# Patient Record
Sex: Female | Born: 1945 | Hispanic: No | Marital: Married | Smoking: Never smoker
Health system: Southern US, Community
[De-identification: ages and names within clinical notes are randomized; demographics above are authoritative.]

## PROBLEM LIST (undated history)

## (undated) DIAGNOSIS — I10 Essential (primary) hypertension: Secondary | ICD-10-CM

## (undated) DIAGNOSIS — W19XXXA Unspecified fall, initial encounter: Secondary | ICD-10-CM

## (undated) DIAGNOSIS — R7309 Other abnormal glucose: Secondary | ICD-10-CM

## (undated) DIAGNOSIS — R531 Weakness: Secondary | ICD-10-CM

## (undated) DIAGNOSIS — T148XXA Other injury of unspecified body region, initial encounter: Secondary | ICD-10-CM

## (undated) DIAGNOSIS — R112 Nausea with vomiting, unspecified: Secondary | ICD-10-CM

## (undated) DIAGNOSIS — R42 Dizziness and giddiness: Secondary | ICD-10-CM

## (undated) DIAGNOSIS — G939 Disorder of brain, unspecified: Secondary | ICD-10-CM

## (undated) DIAGNOSIS — Z9889 Other specified postprocedural states: Secondary | ICD-10-CM

## (undated) DIAGNOSIS — E119 Type 2 diabetes mellitus without complications: Secondary | ICD-10-CM

## (undated) DIAGNOSIS — I483 Typical atrial flutter: Secondary | ICD-10-CM

## (undated) DIAGNOSIS — I4891 Unspecified atrial fibrillation: Secondary | ICD-10-CM

## (undated) DIAGNOSIS — E785 Hyperlipidemia, unspecified: Secondary | ICD-10-CM

## (undated) DIAGNOSIS — I480 Paroxysmal atrial fibrillation: Secondary | ICD-10-CM

## (undated) HISTORY — PX: PARTIAL HYSTERECTOMY: SHX80

## (undated) HISTORY — DX: Dizziness and giddiness: R42

## (undated) HISTORY — DX: Paroxysmal atrial fibrillation: I48.0

## (undated) HISTORY — DX: Unspecified fall, initial encounter: W19.XXXA

## (undated) HISTORY — DX: Hyperlipidemia, unspecified: E78.5

## (undated) HISTORY — DX: Essential (primary) hypertension: I10

## (undated) HISTORY — DX: Weakness: R53.1

## (undated) HISTORY — PX: ELBOW FRACTURE SURGERY: SHX616

## (undated) HISTORY — DX: Unspecified atrial fibrillation: I48.91

## (undated) HISTORY — DX: Other injury of unspecified body region, initial encounter: T14.8XXA

## (undated) HISTORY — DX: Typical atrial flutter: I48.3

## (undated) HISTORY — DX: Disorder of brain, unspecified: G93.9

## (undated) HISTORY — PX: APPENDECTOMY: SHX54

## (undated) HISTORY — DX: Other abnormal glucose: R73.09

## (undated) HISTORY — PX: KNEE ARTHROSCOPY: SUR90

---

## 1998-04-05 ENCOUNTER — Other Ambulatory Visit: Admission: RE | Admit: 1998-04-05 | Discharge: 1998-04-05 | Payer: Self-pay | Admitting: *Deleted

## 1999-04-06 ENCOUNTER — Other Ambulatory Visit: Admission: RE | Admit: 1999-04-06 | Discharge: 1999-04-06 | Payer: Self-pay | Admitting: Obstetrics and Gynecology

## 2000-08-17 ENCOUNTER — Other Ambulatory Visit: Admission: RE | Admit: 2000-08-17 | Discharge: 2000-08-17 | Payer: Self-pay | Admitting: *Deleted

## 2010-12-20 ENCOUNTER — Emergency Department (HOSPITAL_BASED_OUTPATIENT_CLINIC_OR_DEPARTMENT_OTHER)
Admission: EM | Admit: 2010-12-20 | Discharge: 2010-12-20 | Disposition: A | Discharge status: Home / Self Care | Payer: BC Managed Care – PPO | Attending: Emergency Medicine | Admitting: Emergency Medicine

## 2010-12-20 ENCOUNTER — Emergency Department (INDEPENDENT_AMBULATORY_CARE_PROVIDER_SITE_OTHER): Payer: BC Managed Care – PPO

## 2010-12-20 DIAGNOSIS — R000 Tachycardia, unspecified: Secondary | ICD-10-CM

## 2010-12-20 DIAGNOSIS — R29898 Other symptoms and signs involving the musculoskeletal system: Secondary | ICD-10-CM | POA: Insufficient documentation

## 2010-12-20 DIAGNOSIS — R002 Palpitations: Secondary | ICD-10-CM | POA: Insufficient documentation

## 2010-12-20 DIAGNOSIS — I4891 Unspecified atrial fibrillation: Secondary | ICD-10-CM | POA: Insufficient documentation

## 2010-12-20 LAB — BASIC METABOLIC PANEL
BUN: 16 mg/dL (ref 6–23)
CO2: 27 mEq/L (ref 19–32)
Chloride: 103 mEq/L (ref 96–112)
Glucose, Bld: 124 mg/dL — ABNORMAL HIGH (ref 70–99)
Potassium: 4 mEq/L (ref 3.5–5.1)
Sodium: 143 mEq/L (ref 135–145)

## 2010-12-20 LAB — POCT CARDIAC MARKERS
CKMB, poc: 1.4 ng/mL (ref 1.0–8.0)
CKMB, poc: <1 ng/mL — ABNORMAL LOW (ref 1.0–8.0)
Myoglobin, poc: 33.7 ng/mL (ref 12–200)
Troponin i, poc: <0.05 ng/mL (ref 0.00–0.09)
Troponin i, poc: <0.05 ng/mL (ref 0.00–0.09)

## 2010-12-20 LAB — CBC
HCT: 39.8 % (ref 36.0–46.0)
Hemoglobin: 14 g/dL (ref 12.0–15.0)
MCV: 90.2 fL (ref 78.0–100.0)
RDW: 13.3 % (ref 11.5–15.5)
WBC: 6.6 10*3/uL (ref 4.0–10.5)

## 2010-12-20 LAB — DIFFERENTIAL
Basophils Absolute: 0 10*3/uL (ref 0.0–0.1)
Eosinophils Relative: 2 % (ref 0–5)
Lymphocytes Relative: 55 % — ABNORMAL HIGH (ref 12–46)
Lymphs Abs: 3.6 10*3/uL (ref 0.7–4.0)
Neutro Abs: 2.4 10*3/uL (ref 1.7–7.7)

## 2013-10-15 DIAGNOSIS — M1991 Primary osteoarthritis, unspecified site: Secondary | ICD-10-CM | POA: Insufficient documentation

## 2013-10-15 DIAGNOSIS — R011 Cardiac murmur, unspecified: Secondary | ICD-10-CM | POA: Insufficient documentation

## 2013-10-17 DIAGNOSIS — R002 Palpitations: Secondary | ICD-10-CM | POA: Insufficient documentation

## 2015-02-17 DIAGNOSIS — I10 Essential (primary) hypertension: Secondary | ICD-10-CM | POA: Insufficient documentation

## 2015-05-20 DIAGNOSIS — Z8639 Personal history of other endocrine, nutritional and metabolic disease: Secondary | ICD-10-CM | POA: Insufficient documentation

## 2015-05-20 DIAGNOSIS — E782 Mixed hyperlipidemia: Secondary | ICD-10-CM | POA: Insufficient documentation

## 2015-12-04 ENCOUNTER — Emergency Department (HOSPITAL_BASED_OUTPATIENT_CLINIC_OR_DEPARTMENT_OTHER)
Admission: EM | Admit: 2015-12-04 | Discharge: 2015-12-04 | Disposition: A | Discharge status: Home / Self Care | Payer: Medicare Other | Attending: Emergency Medicine | Admitting: Emergency Medicine

## 2015-12-04 ENCOUNTER — Emergency Department (HOSPITAL_BASED_OUTPATIENT_CLINIC_OR_DEPARTMENT_OTHER): Payer: Medicare Other

## 2015-12-04 ENCOUNTER — Encounter (HOSPITAL_BASED_OUTPATIENT_CLINIC_OR_DEPARTMENT_OTHER): Payer: Self-pay

## 2015-12-04 DIAGNOSIS — E119 Type 2 diabetes mellitus without complications: Secondary | ICD-10-CM | POA: Diagnosis not present

## 2015-12-04 DIAGNOSIS — R197 Diarrhea, unspecified: Secondary | ICD-10-CM | POA: Diagnosis not present

## 2015-12-04 DIAGNOSIS — J111 Influenza due to unidentified influenza virus with other respiratory manifestations: Secondary | ICD-10-CM | POA: Insufficient documentation

## 2015-12-04 DIAGNOSIS — Y9289 Other specified places as the place of occurrence of the external cause: Secondary | ICD-10-CM | POA: Diagnosis not present

## 2015-12-04 DIAGNOSIS — I10 Essential (primary) hypertension: Secondary | ICD-10-CM | POA: Insufficient documentation

## 2015-12-04 DIAGNOSIS — S0081XA Abrasion of other part of head, initial encounter: Secondary | ICD-10-CM | POA: Insufficient documentation

## 2015-12-04 DIAGNOSIS — R55 Syncope and collapse: Secondary | ICD-10-CM | POA: Diagnosis present

## 2015-12-04 DIAGNOSIS — W01198A Fall on same level from slipping, tripping and stumbling with subsequent striking against other object, initial encounter: Secondary | ICD-10-CM | POA: Insufficient documentation

## 2015-12-04 DIAGNOSIS — S0240DA Maxillary fracture, left side, initial encounter for closed fracture: Secondary | ICD-10-CM

## 2015-12-04 DIAGNOSIS — Y9389 Activity, other specified: Secondary | ICD-10-CM | POA: Diagnosis not present

## 2015-12-04 DIAGNOSIS — Y998 Other external cause status: Secondary | ICD-10-CM | POA: Insufficient documentation

## 2015-12-04 DIAGNOSIS — S8002XA Contusion of left knee, initial encounter: Secondary | ICD-10-CM | POA: Diagnosis not present

## 2015-12-04 HISTORY — DX: Essential (primary) hypertension: I10

## 2015-12-04 HISTORY — DX: Type 2 diabetes mellitus without complications: E11.9

## 2015-12-04 LAB — INFLUENZA PANEL BY PCR (TYPE A & B)
H1N1 flu by pcr: NOT DETECTED
Influenza A By PCR: POSITIVE — AB
Influenza B By PCR: NEGATIVE

## 2015-12-04 LAB — BASIC METABOLIC PANEL
Anion gap: 10 (ref 5–15)
BUN: 16 mg/dL (ref 6–20)
CALCIUM: 8.9 mg/dL (ref 8.9–10.3)
CHLORIDE: 100 mmol/L — AB (ref 101–111)
CO2: 28 mmol/L (ref 22–32)
CREATININE: 0.88 mg/dL (ref 0.44–1.00)
Glucose, Bld: 145 mg/dL — ABNORMAL HIGH (ref 65–99)
Potassium: 3.4 mmol/L — ABNORMAL LOW (ref 3.5–5.1)
SODIUM: 138 mmol/L (ref 135–145)

## 2015-12-04 LAB — URINALYSIS, ROUTINE W REFLEX MICROSCOPIC
Bilirubin Urine: NEGATIVE
Glucose, UA: NEGATIVE mg/dL
Hgb urine dipstick: NEGATIVE
Ketones, ur: 15 mg/dL — AB
LEUKOCYTES UA: NEGATIVE
NITRITE: NEGATIVE
PROTEIN: NEGATIVE mg/dL
Specific Gravity, Urine: 1.02 (ref 1.005–1.030)
pH: 6 (ref 5.0–8.0)

## 2015-12-04 LAB — CBG MONITORING, ED: GLUCOSE-CAPILLARY: 133 mg/dL — AB (ref 65–99)

## 2015-12-04 LAB — CBC
HCT: 42.1 % (ref 36.0–46.0)
Hemoglobin: 14.4 g/dL (ref 12.0–15.0)
MCH: 32.2 pg (ref 26.0–34.0)
MCHC: 34.2 g/dL (ref 30.0–36.0)
MCV: 94.2 fL (ref 78.0–100.0)
PLATELETS: 182 10*3/uL (ref 150–400)
RBC: 4.47 MIL/uL (ref 3.87–5.11)
RDW: 14.1 % (ref 11.5–15.5)
WBC: 8 10*3/uL (ref 4.0–10.5)

## 2015-12-04 LAB — TROPONIN I

## 2015-12-04 LAB — I-STAT CG4 LACTIC ACID, ED: LACTIC ACID, VENOUS: 1.43 mmol/L (ref 0.5–2.0)

## 2015-12-04 MED ORDER — ONDANSETRON 4 MG PO TBDP: ORAL_TABLET | ORAL | Status: DC

## 2015-12-04 MED ORDER — OSELTAMIVIR PHOSPHATE 75 MG PO CAPS: 75.0000 mg | ORAL_CAPSULE | Freq: Two times a day (BID) | ORAL | Status: DC

## 2015-12-04 MED ORDER — SODIUM CHLORIDE 0.9 % IV BOLUS (SEPSIS)
1000.0000 mL | Freq: Once | INTRAVENOUS | Status: AC
  Administered 2015-12-04: 1000 mL via INTRAVENOUS

## 2015-12-04 NOTE — ED Notes (Signed)
Pt made aware of the need for a urine sample. Unable to void at this time.

## 2015-12-04 NOTE — ED Notes (Signed)
Pt ambulated to and from restroom with no difficulty

## 2015-12-04 NOTE — ED Notes (Signed)
Pt ambulated with assistance from family.

## 2015-12-04 NOTE — ED Notes (Signed)
Family at bedside. 

## 2015-12-04 NOTE — ED Notes (Signed)
Patient here after syncopal episode x 2 this am, abrasion to face, had flu shot Thursday and developed chills, cough and severe fatigue yesterday. Vomiting and diarrhea this am. Pale on arrival

## 2015-12-04 NOTE — Discharge Instructions (Signed)
For your maxillary fracture, you should keep antibiotic ointment over the abrasion. You need to eat a soft diet for the next 3 weeks.   Influenza, Adult Influenza ("the flu") is a viral infection of the respiratory tract. It occurs more often in winter months because people spend more time in close contact with one another. Influenza can make you feel very sick. Influenza easily spreads from person to person (contagious). CAUSES  Influenza is caused by a virus that infects the respiratory tract. You can catch the virus by breathing in droplets from an infected person's cough or sneeze. You can also catch the virus by touching something that was recently contaminated with the virus and then touching your mouth, nose, or eyes. RISKS AND COMPLICATIONS You may be at risk for a more severe case of influenza if you smoke cigarettes, have diabetes, have chronic heart disease (such as heart failure) or lung disease (such as asthma), or if you have a weakened immune system. Elderly people and pregnant women are also at risk for more serious infections. The most common problem of influenza is a lung infection (pneumonia). Sometimes, this problem can require emergency medical care and may be life threatening. SIGNS AND SYMPTOMS  Symptoms typically last 4 to 10 days and may include:  Fever.  Chills.  Headache, body aches, and muscle aches.  Sore throat.  Chest discomfort and cough.  Poor appetite.  Weakness or feeling tired.  Dizziness.  Nausea or vomiting. DIAGNOSIS  Diagnosis of influenza is often made based on your history and a physical exam. A nose or throat swab test can be done to confirm the diagnosis. TREATMENT  In mild cases, influenza goes away on its own. Treatment is directed at relieving symptoms. For more severe cases, your health care provider may prescribe antiviral medicines to shorten the sickness. Antibiotic medicines are not effective because the infection is caused by a  virus, not by bacteria. HOME CARE INSTRUCTIONS  Take medicines only as directed by your health care provider.  Use a cool mist humidifier to make breathing easier.  Get plenty of rest until your temperature returns to normal. This usually takes 3 to 4 days.  Drink enough fluid to keep your urine clear or pale yellow.  Cover yourmouth and nosewhen coughing or sneezing,and wash your handswellto prevent thevirusfrom spreading.  Stay homefromwork orschool untilthe fever is gonefor at least 52full day. PREVENTION  An annual influenza vaccination (flu shot) is the best way to avoid getting influenza. An annual flu shot is now routinely recommended for all adults in the [place] SEEK MEDICAL CARE IF:  You experiencechest pain, yourcough worsens,or you producemore mucus.  Youhave nausea,vomiting, ordiarrhea.  Your fever returns or gets worse. SEEK IMMEDIATE MEDICAL CARE IF:  You havetrouble breathing, you become short of breath,or your skin ornails becomebluish.  You have severe painor stiffnessin the neck.  You develop a sudden headache, or pain in the face or ear.  You have nausea or vomiting that you cannot control. MAKE SURE YOU:   Understand these instructions.  Will watch your condition.  Will get help right away if you are not doing well or get worse.   This information is not intended to replace advice given to you by your health care provider. Make sure you discuss any questions you have with your health care provider.   Document Released: 09/22/2000 Document Revised: 10/16/2014 Document Reviewed: 12/25/2011 Elsevier Interactive Patient Education 2016 [employer].  Syncope Syncope is a medical term for fainting  or passing out. This means you lose consciousness and drop to the ground. People are generally unconscious for less than 5 minutes. You may have some muscle twitches for up to 15 seconds before waking up and returning to normal. Syncope  occurs more often in older adults, but it can happen to anyone. While most causes of syncope are not dangerous, syncope can be a sign of a serious medical problem. It is important to seek medical care.  CAUSES  Syncope is caused by a sudden drop in blood flow to the brain. The specific cause is often not determined. Factors that can bring on syncope include:  Taking medicines that lower blood pressure.  Sudden changes in posture, such as standing up quickly.  Taking more medicine than prescribed.  Standing in one place for too long.  Seizure disorders.  Dehydration and excessive exposure to heat.  Low blood sugar (hypoglycemia).  Straining to have a bowel movement.  Heart disease, irregular heartbeat, or other circulatory problems.  Fear, emotional distress, seeing blood, or severe pain. SYMPTOMS  Right before fainting, you may:  Feel dizzy or light-headed.  Feel nauseous.  See all white or all black in your field of vision.  Have cold, clammy skin. DIAGNOSIS  Your health care provider will ask about your symptoms, perform a physical exam, and perform an electrocardiogram (ECG) to record the electrical activity of your heart. Your health care provider may also perform other heart or blood tests to determine the cause of your syncope which may include:  Transthoracic echocardiogram (TTE). During echocardiography, sound waves are used to evaluate how blood flows through your heart.  Transesophageal echocardiogram (TEE).  Cardiac monitoring. This allows your health care provider to monitor your heart rate and rhythm in real time.  Holter monitor. This is a portable device that records your heartbeat and can help diagnose heart arrhythmias. It allows your health care provider to track your heart activity for several days, if needed.  Stress tests by exercise or by giving medicine that makes the heart beat faster. TREATMENT  In most cases, no treatment is needed. Depending on  the cause of your syncope, your health care provider may recommend changing or stopping some of your medicines. HOME CARE INSTRUCTIONS  Have someone stay with you until you feel stable.  Do not drive, use machinery, or play sports until your health care provider says it is okay.  Keep all follow-up appointments as directed by your health care provider.  Lie down right away if you start feeling like you might faint. Breathe deeply and steadily. Wait until all the symptoms have passed.  Drink enough fluids to keep your urine clear or pale yellow.  If you are taking blood pressure or heart medicine, get up slowly and take several minutes to sit and then stand. This can reduce dizziness. SEEK IMMEDIATE MEDICAL CARE IF:   You have a severe headache.  You have unusual pain in the chest, abdomen, or back.  You are bleeding from your mouth or rectum, or you have black or tarry stool.  You have an irregular or very fast heartbeat.  You have pain with breathing.  You have repeated fainting or seizure-like jerking during an episode.  You faint when sitting or lying down.  You have confusion.  You have trouble walking.  You have severe weakness.  You have vision problems. If you fainted, call your local emergency services (911 in [place] Do not drive yourself to the hospital.  This information is not intended to replace advice given to you by your health care provider. Make sure you discuss any questions you have with your health care provider.   Document Released: 09/25/2005 Document Revised: 02/09/2015 Document Reviewed: 11/24/2011 Elsevier Interactive Patient Education [employer]

## 2015-12-04 NOTE — ED Provider Notes (Signed)
CSN: [id number]     Arrival date & time 12/04/15  1100 History   First MD Initiated Contact with Patient 12/04/15 1112     Chief Complaint  Patient presents with  . Loss of Consciousness     (Consider location/radiation/quality/duration/timing/severity/associated sxs/prior Treatment) HPI Comments: Patient presents after syncopal episode. She has a history of diabetes and hypertension. She recently was taken off her diabetes medicine and is now diet controlled. She states that she saw her PCP for a routine checkup 2 days ago. She received a influenza vaccine at that time. She states yesterday she started feeling achy all over and felt like she was getting sick. She was very fatigued without energy. She noted some nasal congestion and a cough starting. She felt feverish through the night but didn't check her temperature. She states that this morning when she got up to walk to the bathroom, she felt like she wasn't good and make it due to the fatigue and subsequent passed out, striking her left face and head on the floor. Her husband got her up and then she had a subsequent brief syncopal episode.  There is no seizure type activity. She had vomiting and diarrhea following this. She complains of pain to the left face in her left knee following a fall. She also has a headache. She's not on anticoagulants. She has an ongoing cough. She denies any chest pain or shortness of breath. She feels little bit lightheaded but more generalized fatigue.  Patient is a 70 y.o. female presenting with syncope.  Loss of Consciousness Associated symptoms: fever (Subjective), nausea and vomiting   Associated symptoms: no chest pain, no diaphoresis, no dizziness, no headaches, no shortness of breath and no weakness     Past Medical History  Diagnosis Date  . Diabetes mellitus without complication (HCC)   . Hypertension    History reviewed. No pertinent past surgical history. No family history on file. Social History   Substance Use Topics  . Smoking status: Never Smoker   . Smokeless tobacco: None  . Alcohol Use: None   OB History    No data available     Review of Systems  Constitutional: Positive for fever (Subjective), chills, activity change, appetite change and fatigue. Negative for diaphoresis.  HENT: Positive for congestion, postnasal drip and rhinorrhea. Negative for sneezing.   Eyes: Negative.   Respiratory: Positive for cough. Negative for chest tightness and shortness of breath.   Cardiovascular: Positive for syncope. Negative for chest pain and leg swelling.  Gastrointestinal: Positive for nausea, vomiting and diarrhea. Negative for abdominal pain and blood in stool.  Genitourinary: Negative for frequency, hematuria, flank pain and difficulty urinating.  Musculoskeletal: Negative for back pain and arthralgias.  Skin: Negative for rash.  Neurological: Positive for syncope and light-headedness. Negative for dizziness, speech difficulty, weakness, numbness and headaches.      Allergies  Codeine and Prednisone  Home Medications   Prior to Admission medications   Medication Sig Start Date End Date Taking? Authorizing Provider  ondansetron (ZOFRAN ODT) 4 MG disintegrating tablet 4mg  ODT q4 hours prn nausea/vomit 12/04/15   Rolan Bucco, MD  oseltamivir (TAMIFLU) 75 MG capsule Take 1 capsule (75 mg total) by mouth every 12 (twelve) hours. 12/04/15   Rolan Bucco, MD   BP 104/46 mmHg  Pulse 84  Temp(Src) 99.1 F (37.3 C) (Oral)  Resp 22  Ht 5\' 5"  (1.651 m)  Wt 160 lb (72.576 kg)  BMI 26.63 kg/m2  SpO2 98% Physical Exam  Constitutional: She is oriented to person, place, and time. She appears well-developed and well-nourished.  HENT:  Head: Normocephalic.  Mouth/Throat: Oropharynx is clear and moist.  Patient has abrasions to the left side of face with pain and swelling over the left maxilla. There is no malocclusion. There is no hemotympanum. There is no evident eye injury.   Eyes: Conjunctivae and EOM are normal. Pupils are equal, round, and reactive to light.  Neck: Normal range of motion. Neck supple.  No pain to the cervical thoracic or lumbosacral spine  Cardiovascular: Normal rate, regular rhythm and normal heart sounds.   Pulmonary/Chest: Effort normal and breath sounds normal. No respiratory distress. She has no wheezes. She has no rales. She exhibits no tenderness.  Abdominal: Soft. Bowel sounds are normal. There is no tenderness. There is no rebound and no guarding.  Musculoskeletal: Normal range of motion. She exhibits tenderness. She exhibits no edema.  Small area of ecchymosis and tenderness to the lateral aspect of the left knee. There is no pain to the ankle or hip. There is no other pain on palpation or range of motion extremities  Lymphadenopathy:    She has no cervical adenopathy.  Neurological: She is alert and oriented to person, place, and time.  Motor 5 out of 5 all extraneous, sensation grossly intact to light touch all extremities, no pronator drift, cranial nerves intact  Skin: Skin is warm and dry. No rash noted.  Psychiatric: She has a normal mood and affect.    ED Course  Procedures (including critical care time) Labs Review Labs Reviewed  BASIC METABOLIC PANEL - Abnormal; Notable for the following:    Potassium 3.4 (*)    Chloride 100 (*)    Glucose, Bld 145 (*)    All other components within normal limits  URINALYSIS, ROUTINE W REFLEX MICROSCOPIC (NOT AT [facility]) - Abnormal; Notable for the following:    Ketones, ur 15 (*)    All other components within normal limits  CBG MONITORING, ED - Abnormal; Notable for the following:    Glucose-Capillary 133 (*)    All other components within normal limits  CBC  TROPONIN I  INFLUENZA PANEL BY PCR (TYPE A & B, H1N1)  I-STAT CG4 LACTIC ACID, ED    Imaging Review Dg Chest 2 View  12/04/2015  CLINICAL DATA:  Cough.  Initial encounter. EXAM: CHEST  2 VIEW COMPARISON:  12/19/2010  FINDINGS: Lungs are clear bilaterally. Heart and mediastinum are within normal limits. Trachea is midline. No large pleural effusions. Mild degenerative endplate and disc changes in the thoracic and lumbar spine. IMPRESSION: No active cardiopulmonary disease. Electronically Signed   By: Richarda Overlie M.D.   On: 12/04/2015 12:30   Ct Head Wo Contrast  12/04/2015  CLINICAL DATA:  70 year old who fell when she awoke to go to the bathroom earlier this morning. Two lacerations on the left cheek. Acute onset of headache and dizziness. Loss of consciousness. Patient hypotensive upon arrival to the emergency department. Current history of hypertension diabetes. Initial encounter. EXAM: CT HEAD WITHOUT CONTRAST CT MAXILLOFACIAL WITHOUT CONTRAST CT CERVICAL SPINE WITHOUT CONTRAST TECHNIQUE: Multidetector CT imaging of the head, cervical spine, and maxillofacial structures were performed using the standard protocol without intravenous contrast. Multiplanar CT image reconstructions of the cervical spine and maxillofacial structures were also generated. A vitamin-E capsule was placed on the right temple in order to reliably differentiate right from left. COMPARISON:  None. FINDINGS: CT HEAD FINDINGS Ventricular system normal in size and appearance  for age. No mass lesion. No midline shift. No acute hemorrhage or hematoma. No extra-axial fluid collections. No evidence of acute infarction. No focal brain parenchymal abnormality. No skull fracture or other focal osseous abnormality involving the skull. Bilateral mastoid air cells and bilateral middle ear cavities well-aerated. Minimal to mild bilateral carotid siphon and vertebral artery atherosclerosis. CT MAXILLOFACIAL FINDINGS Gas in the subcutaneous tissues of the left cheek related to the lacerations. No evidence of soft tissue hematoma. Inwardly displaced fracture of the anterior wall of the left maxillary sinus associated with a fluid level in the sinus. No other fractures  identified involving the facial bones. Temporomandibular joints intact without significant degenerative change. Both orbits and both globes intact. Small mucous retention cyst or polyp in the right maxillary sinus. Opacification of a solitary left anterior ethmoid air cell. Right ethmoid air cells, both frontal sinuses and both sphenoid sinuses well aerated. Slight bony nasal septal deviation to the left. CT CERVICAL SPINE FINDINGS No fractures identified involving the cervical spine. Sagittal reconstructed images demonstrate anatomic posterior alignment. Severe disc space narrowing and associated eburnation in the endplates adjacent to the C5-6 and C6-7 discs. Mild disc space narrowing at C4-5. No evidence of spinal stenosis. Facet joints intact throughout with degenerative changes. Coronal reformatted images demonstrate an intact craniocervical junction, intact C1-C2 articulation, intact dens, and intact lateral masses throughout. Facet and uncinate hypertrophy account for multilevel foraminal stenoses including mild left C3-4, moderate left and severe right C4-5, severe bilateral C5-6, severe bilateral C6-7. IMPRESSION: 1. No acute or significant intracranial abnormality for age. 2. Inwardly displaced fracture of the anterior wall of the left maxillary sinus. No other facial bone fractures identified. 3. No cervical spine fractures identified. 4. 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 No evidence of spinal stenosis. Electronically Signed   By: Hulan Saas M.D.   On: 12/04/2015 12:23   Ct Cervical Spine Wo Contrast  12/04/2015  CLINICAL DATA:  70 year old who fell when she awoke to go to the bathroom earlier this morning. Two lacerations on the left cheek. Acute onset of headache and dizziness. Loss of consciousness. Patient hypotensive upon arrival to the emergency department. Current history of hypertension  diabetes. Initial encounter. EXAM: CT HEAD WITHOUT CONTRAST CT MAXILLOFACIAL WITHOUT CONTRAST CT CERVICAL SPINE WITHOUT CONTRAST TECHNIQUE: Multidetector CT imaging of the head, cervical spine, and maxillofacial structures were performed using the standard protocol without intravenous contrast. Multiplanar CT image reconstructions of the cervical spine and maxillofacial structures were also generated. A vitamin-E capsule was placed on the right temple in order to reliably differentiate right from left. COMPARISON:  None. FINDINGS: CT HEAD FINDINGS Ventricular system normal in size and appearance for age. No mass lesion. No midline shift. No acute hemorrhage or hematoma. No extra-axial fluid collections. No evidence of acute infarction. No focal brain parenchymal abnormality. No skull fracture or other focal osseous abnormality involving the skull. Bilateral mastoid air cells and bilateral middle ear cavities well-aerated. Minimal to mild bilateral carotid siphon and vertebral artery atherosclerosis. CT MAXILLOFACIAL FINDINGS Gas in the subcutaneous tissues of the left cheek related to the lacerations. No evidence of soft tissue hematoma. Inwardly displaced fracture of the anterior wall of the left maxillary sinus associated with a fluid level in the sinus. No other fractures identified involving the facial bones. Temporomandibular joints intact without significant degenerative change. Both orbits and both globes intact. Small mucous retention cyst or polyp in the right maxillary  sinus. Opacification of a solitary left anterior ethmoid air cell. Right ethmoid air cells, both frontal sinuses and both sphenoid sinuses well aerated. Slight bony nasal septal deviation to the left. CT CERVICAL SPINE FINDINGS No fractures identified involving the cervical spine. Sagittal reconstructed images demonstrate anatomic posterior alignment. Severe disc space narrowing and associated eburnation in the endplates adjacent to the C5-6  and C6-7 discs. Mild disc space narrowing at C4-5. No evidence of spinal stenosis. Facet joints intact throughout with degenerative changes. Coronal reformatted images demonstrate an intact craniocervical junction, intact C1-C2 articulation, intact dens, and intact lateral masses throughout. Facet and uncinate hypertrophy account for multilevel foraminal stenoses including mild left C3-4, moderate left and severe right C4-5, severe bilateral C5-6, severe bilateral C6-7. IMPRESSION: 1. No acute or significant intracranial abnormality for age. 2. Inwardly displaced fracture of the anterior wall of the left maxillary sinus. No other facial bone fractures identified. 3. No cervical spine fractures identified. 4. 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 No evidence of spinal stenosis. Electronically Signed   By: Hulan Saas M.D.   On: 12/04/2015 12:23   Dg Knee Complete 4 Views Left  12/04/2015  CLINICAL DATA:  Pain after fall EXAM: LEFT KNEE - COMPLETE 4+ VIEW COMPARISON:  None. FINDINGS: No fracture or dislocation. Degenerative changes most prominent in medial compartment. IMPRESSION: No acute abnormality. Electronically Signed   By: Gerome Sam III M.D   On: 12/04/2015 12:30   Ct Maxillofacial Wo Cm  12/04/2015  CLINICAL DATA:  71 year old who fell when she awoke to go to the bathroom earlier this morning. Two lacerations on the left cheek. Acute onset of headache and dizziness. Loss of consciousness. Patient hypotensive upon arrival to the emergency department. Current history of hypertension diabetes. Initial encounter. EXAM: CT HEAD WITHOUT CONTRAST CT MAXILLOFACIAL WITHOUT CONTRAST CT CERVICAL SPINE WITHOUT CONTRAST TECHNIQUE: Multidetector CT imaging of the head, cervical spine, and maxillofacial structures were performed using the standard protocol without intravenous contrast. Multiplanar CT image  reconstructions of the cervical spine and maxillofacial structures were also generated. A vitamin-E capsule was placed on the right temple in order to reliably differentiate right from left. COMPARISON:  None. FINDINGS: CT HEAD FINDINGS Ventricular system normal in size and appearance for age. No mass lesion. No midline shift. No acute hemorrhage or hematoma. No extra-axial fluid collections. No evidence of acute infarction. No focal brain parenchymal abnormality. No skull fracture or other focal osseous abnormality involving the skull. Bilateral mastoid air cells and bilateral middle ear cavities well-aerated. Minimal to mild bilateral carotid siphon and vertebral artery atherosclerosis. CT MAXILLOFACIAL FINDINGS Gas in the subcutaneous tissues of the left cheek related to the lacerations. No evidence of soft tissue hematoma. Inwardly displaced fracture of the anterior wall of the left maxillary sinus associated with a fluid level in the sinus. No other fractures identified involving the facial bones. Temporomandibular joints intact without significant degenerative change. Both orbits and both globes intact. Small mucous retention cyst or polyp in the right maxillary sinus. Opacification of a solitary left anterior ethmoid air cell. Right ethmoid air cells, both frontal sinuses and both sphenoid sinuses well aerated. Slight bony nasal septal deviation to the left. CT CERVICAL SPINE FINDINGS No fractures identified involving the cervical spine. Sagittal reconstructed images demonstrate anatomic posterior alignment. Severe disc space narrowing and associated eburnation in the endplates adjacent to the C5-6 and C6-7 discs. Mild disc space narrowing at C4-5. No evidence  of spinal stenosis. Facet joints intact throughout with degenerative changes. Coronal reformatted images demonstrate an intact craniocervical junction, intact C1-C2 articulation, intact dens, and intact lateral masses throughout. Facet and uncinate  hypertrophy account for multilevel foraminal stenoses including mild left C3-4, moderate left and severe right C4-5, severe bilateral C5-6, severe bilateral C6-7. IMPRESSION: 1. No acute or significant intracranial abnormality for age. 2. Inwardly displaced fracture of the anterior wall of the left maxillary sinus. No other facial bone fractures identified. 3. No cervical spine fractures identified. 4. 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 No evidence of spinal stenosis. Electronically Signed   By: Hulan Saas M.D.   On: 12/04/2015 12:23   I have personally reviewed and evaluated these images and lab results as part of my medical decision-making.   EKG Interpretation   Date/Time:  Saturday December 04 2015 11:19:26 EST Ventricular Rate:  97 PR Interval:  144 QRS Duration: 89 QT Interval:  321 QTC Calculation: 408 R Axis:   75 Text Interpretation:  Sinus rhythm Borderline repolarization abnormality  slight ST depression inferiorly, similar to prior EKG.  ST depression  anteriolry, changed from prior EKG Confirmed by Glynda Soliday  MD, Sheranda Seabrooks ([id number])  on 12/04/2015 11:58:59 AM      MDM   Final diagnoses:  Influenza  Vasovagal syncope  Maxillary fracture, left side, initial encounter for closed fracture    Patient presents after syncopal episode. She's had preceding symptoms of runny nose congestion coughing and myalgias. She's had subjective fevers. Her symptoms are consistent with probable influenza. This likely led to the syncopal episode. She was given normal saline bolus here in the ED. Her blood pressures been stable. Her lactate is normal without other suggestions of sepsis. There is no evidence of pneumonia on x-ray. She did have a traumatic injury to her face from the syncopal episode. Her head CT is negative for intracranial hemorrhage. There is no evident injuries to her cervical  spine. CT face shows a displaced maxillary fracture. She has an overlying abrasion. I cleaned it out and I don't see evidence that it communicates with the fracture. I discussed the fracture with Dr. Emeline Darling, the on-call ENT. He advises that the patient can maintain a soft diet for the next few weeks. It is nonoperative. He does not recommend antibiotics but advises her to keep some antibiotic ointment on the wound. I discussed this with the patient.  Patient is feeling much better after IV fluids. She ambulated without symptoms. She feels comfortable going home. I advised her to continue by mouth fluid intake. I advised her that she can use Mucinex for the congestion. I do feel that we should start her on Tamiflu. I gave her a prescription for Tamiflu and Zofran. I advised her to stop taking her blood pressure medicine for the next 2 days and follow-up with her doctor on Monday to get advice on whether to restart it. She also needs a recheck on Monday to see how she's doing. I discussed her fracture and that she needs to maintain a soft diet for the next 3 weeks. I advised her to keep antibiotic ointment on her wounds. She had some very slight ST depression on EKG but she doesn't have chest pain shortness of breath or other symptoms that sound cardiac in nature. Her troponin is negative. She was eyes return here if she has worsening symptoms.    Rolan Bucco, MD 12/04/15 [phone]

## 2015-12-04 NOTE — ED Notes (Signed)
Pt transported to CT and XR at this time   

## 2015-12-04 NOTE — ED Notes (Signed)
MD at bedside. 

## 2015-12-06 ENCOUNTER — Telehealth (HOSPITAL_BASED_OUTPATIENT_CLINIC_OR_DEPARTMENT_OTHER): Payer: Self-pay | Admitting: Emergency Medicine

## 2016-04-21 ENCOUNTER — Telehealth: Payer: Self-pay | Admitting: Cardiovascular Disease

## 2016-04-21 NOTE — Telephone Encounter (Signed)
Received records from [facility] for appointment on 04/25/16 with Dr Duke Salviaandolph.  [facility] Dr Leonides Sakeandolph's schedule on 04/25/16. lp

## 2016-04-24 NOTE — Progress Notes (Signed)
Cardiology Office Note   Date:  04/25/2016   ID:  Ann Hobbs, DOB [birth year], MRN [id number]  PCP:  Smothers, Andree Elk, NP  Cardiologist:   Skeet Latch, MD   Chief Complaint  Patient presents with  . New Patient (Initial Visit)     History of Present Illness: Ann Hobbs is a 70 y.o. female with diabetes, hypertension, and hyperlipidemia who presents for an evaluation of palpitations.  Ann Hobbs was seen by Augusto Gamble, NP on 12/02/15 at which time she reported palpitations and was referred to cardiology for further evaluation.  Her brother was recently diagnosed with atrial fibrillation in her mother has a pacemaker due to heart block.  TSH at the time was 1.34.  She Reports episodes of palpitations that have been ongoing for years. She was seen in the emergency department in 2012 with an episode of heart racing to the 150s. On review of the chart it appears that she received IV diltiazem which did not break the rhythm. She subsequently received adenosine and returned to sinus rhythm. Her initial presenting rhythm appears to be in atrial flutter, though it was read as sinus tachycardia at the time. Her subsequent EKG showed atrial fibrillation with a rate of 100 bpm.  She was instructed to follow up with cardiology, which she did, but reports that he never looked at her EKGs.  He recommended that she take a beta blocker but she declined due to concern that it might cause fatigue.  Since 2012 she continues to have episodes of palpitations. She notices that it occurs more frequently after taking NSAIDs.  She gets a strange feeling in her throat and can feel her heart beating in her ears.  It typically occurs at night and goes away by the time she wakes up in the AM.  She doesn't feel short of breath but notes pressure in her chest.  There is no lightheadeness or dizzines.  Her symptoms are worse with exertion. The episodes occur 1-2 times per month and her heart rate  goes to 140-150.  She notes that her blood pressure goes up when she is in this rhythm, but it is typically well-controlled.  In fact, she has not been taking Maxzide lately because her BP has been low.  She lost 30 lb over the last several months.  She had an episode of syncope in February that was attributed to the flu.  Her BP was very low when she was seen in the ED.    Ann Hobbs does water aerobics 2-3 times per week.  She denies exertional symptoms.  She has chronic ankle edema.   Past Medical History  Diagnosis Date  . Diabetes mellitus without complication ([place])   . Hypertension   . Atrial fibrillation ([place]) 04/25/2016  . Atrial flutter ([place]) 04/25/2016  . Essential hypertension 04/25/2016  . Hyperlipidemia 04/25/2016    No past surgical history on file.   Current Outpatient Prescriptions  Medication Sig Dispense Refill  . Blood Glucose Monitoring Suppl (GHT BLOOD GLUCOSE MONITOR) w/Device KIT Apply 1 strip topically as directed.    Marland Kitchen estradiol (ESTRACE) 0.1 MG/GM vaginal cream Place 1 Applicatorful vaginally as directed.    . Multiple Vitamins-Minerals (WOMENS MULTIVITAMIN PLUS PO) Take 1 tablet by mouth daily.    . ReliOn Ultra Thin Lancets MISC Apply 1 strip topically as directed.    Marland Kitchen apixaban (ELIQUIS) 5 MG TABS tablet Take 1 tablet (5 mg total) by mouth 2 (two) times daily. 60 tablet  5  . metoprolol tartrate (LOPRESSOR) 25 MG tablet 1/2 TABLET BY MOUTH DAILY AS NEEDED FOR ONSET OF ATRIAL FIB/FLUTTER 15 tablet 5   No current facility-administered medications for this visit.    Allergies:   Codeine and Prednisone    Social History:  The patient  reports that she has never smoked. She does not have any smokeless tobacco history on file.   Family History:  The patient's family history includes Cancer in her father.    ROS:  Please see the history of present illness.   Otherwise, review of systems are positive for none.   All other systems are reviewed and negative.     PHYSICAL EXAM: VS:  BP 128/78 mmHg  Pulse 69  Ht '5\' 4"'$  (1.626 m)  Wt 158 lb 3.2 oz (71.759 kg)  BMI 27.14 kg/m2  SpO2 97% , BMI Body mass index is 27.14 kg/(m^2). GENERAL:  Well appearing HEENT:  Pupils equal round and reactive, fundi not visualized, oral mucosa unremarkable NECK:  No jugular venous distention, waveform within normal limits, carotid upstroke brisk and symmetric, no bruits, no thyromegaly LYMPHATICS:  No cervical adenopathy LUNGS:  Clear to auscultation bilaterally HEART:  RRR.  PMI not displaced or sustained,S1 and S2 within normal limits, no S3, no S4, no clicks, no rubs, no murmurs ABD:  Flat, positive bowel sounds normal in frequency in pitch, no bruits, no rebound, no guarding, no midline pulsatile mass, no hepatomegaly, no splenomegaly EXT:  2 plus pulses throughout, no edema, no cyanosis no clubbing SKIN:  No rashes no nodules NEURO:  Cranial nerves II through XII grossly intact, motor grossly intact throughout PSYCH:  Cognitively intact, oriented to person place and tim   EKG:  EKG is ordered today. The ekg ordered today demonstrates sinus rhythm.  Rate 67 bpm.   05/18/11: typical atrial flutter.  Rate 130 bpm. 05/18/11: Atrial fibrillation rate 100 bpm.  Recent Labs: 12/04/2015: BUN 16; Creatinine, Ser 0.88; Hemoglobin 14.4; Platelets 182; Potassium 3.4*; Sodium 138   Hemoglobin A1c 05/20/15: 5.7% 05/20/15: LDL 155, triglycerides 132, HDL 43  Lipid Panel No results found for: CHOL, TRIG, HDL, CHOLHDL, VLDL, LDLCALC, LDLDIRECT    Wt Readings from Last 3 Encounters:  04/25/16 158 lb 3.2 oz (71.759 kg)  12/04/15 160 lb (72.576 kg)     ASSESSMENT AND PLAN:  # Atrial fibrillation/flutter: Ann Hobbs had documented atrial fibrillation and flutter.  She continues to have symptoms, but only once per month.  Her BP is well-controlled off any medications.  She would like to avoid taking as many medicines as possible.  We will start metoprolol tartrate  12.'5mg'$  prn palpitations.  We will also start Eliquis '5mg'$  bid.  We will check a BMP and magnesium.  We will check an echo and CBC.  This patients CHA2DS2-VASc Score and unadjusted Ischemic Stroke Rate (% per year) is equal to 3.2 % stroke rate/year from a score of 3  Above score calculated as 1 point each if present [CHF, HTN, DM, Vascular=MI/PAD/Aortic Plaque, Age if 65-74, or Female] Above score calculated as 2 points each if present [Age > 75, or Stroke/TIA/TE]   # Hypertension: BP is well-controlled.  She has only been taking Maxzide intermittently.  We will stop it for now.  BP is likely better given her weight loss.     Current medicines are reviewed at length with the patient today.  The patient does not have concerns regarding medicines.  The following changes have been made:  no  change  Labs/ tests ordered today include:   Orders Placed This Encounter  Procedures  . Basic metabolic panel  . CBC with Differential/Platelet  . EKG 12-Lead  . ECHOCARDIOGRAM COMPLETE     Disposition:   FU with Ayron Fillinger C. Oval Linsey, MD, [facility] in 2 months.   Time spent: 60 minutes-Greater than 50% of this time was spent in counseling, explanation of diagnosis, planning of further management, and coordination of care.  This note was written with the assistance of speech recognition software.  Please excuse any transcriptional errors.  Signed, Jakia Kennebrew C. Oval Linsey, MD, [facility]  04/25/2016 1:30 PM    [facility]

## 2016-04-25 ENCOUNTER — Ambulatory Visit (INDEPENDENT_AMBULATORY_CARE_PROVIDER_SITE_OTHER): Payer: Medicare Other | Admitting: Cardiovascular Disease

## 2016-04-25 ENCOUNTER — Encounter: Payer: Self-pay | Admitting: Cardiovascular Disease

## 2016-04-25 VITALS — BP 128/78 | HR 69 | Ht 64.0 in | Wt 158.2 lb

## 2016-04-25 DIAGNOSIS — E785 Hyperlipidemia, unspecified: Secondary | ICD-10-CM | POA: Insufficient documentation

## 2016-04-25 DIAGNOSIS — I10 Essential (primary) hypertension: Secondary | ICD-10-CM

## 2016-04-25 DIAGNOSIS — I480 Paroxysmal atrial fibrillation: Secondary | ICD-10-CM

## 2016-04-25 DIAGNOSIS — I4891 Unspecified atrial fibrillation: Secondary | ICD-10-CM | POA: Diagnosis not present

## 2016-04-25 DIAGNOSIS — I4892 Unspecified atrial flutter: Secondary | ICD-10-CM | POA: Insufficient documentation

## 2016-04-25 DIAGNOSIS — I483 Typical atrial flutter: Secondary | ICD-10-CM

## 2016-04-25 HISTORY — DX: Hyperlipidemia, unspecified: E78.5

## 2016-04-25 HISTORY — DX: Essential (primary) hypertension: I10

## 2016-04-25 HISTORY — DX: Paroxysmal atrial fibrillation: I48.0

## 2016-04-25 HISTORY — DX: Typical atrial flutter: I48.3

## 2016-04-25 LAB — CBC WITH DIFFERENTIAL/PLATELET
BASOS PCT: 1 %
Basophils Absolute: 57 cells/uL (ref 0–200)
EOS PCT: 2 %
Eosinophils Absolute: 114 cells/uL (ref 15–500)
HEMATOCRIT: 39.8 % (ref 35.0–45.0)
HEMOGLOBIN: 13.2 g/dL (ref 11.7–15.5)
LYMPHS ABS: 2622 {cells}/uL (ref 850–3900)
Lymphocytes Relative: 46 %
MCH: 31.4 pg (ref 27.0–33.0)
MCHC: 33.2 g/dL (ref 32.0–36.0)
MCV: 94.8 fL (ref 80.0–100.0)
MONO ABS: 342 {cells}/uL (ref 200–950)
MPV: 10.6 fL (ref 7.5–12.5)
Monocytes Relative: 6 %
NEUTROS ABS: 2565 {cells}/uL (ref 1500–7800)
NEUTROS PCT: 45 %
Platelets: 183 10*3/uL (ref 140–400)
RBC: 4.2 MIL/uL (ref 3.80–5.10)
RDW: 13.8 % (ref 11.0–15.0)
WBC: 5.7 10*3/uL (ref 3.8–10.8)

## 2016-04-25 LAB — BASIC METABOLIC PANEL
BUN: 17 mg/dL (ref 7–25)
CALCIUM: 9.3 mg/dL (ref 8.6–10.4)
CO2: 30 mmol/L (ref 20–31)
Chloride: 104 mmol/L (ref 98–110)
Creat: 0.74 mg/dL (ref 0.50–0.99)
Glucose, Bld: 96 mg/dL (ref 65–99)
POTASSIUM: 4.5 mmol/L (ref 3.5–5.3)
SODIUM: 141 mmol/L (ref 135–146)

## 2016-04-25 MED ORDER — METOPROLOL TARTRATE 25 MG PO TABS: ORAL_TABLET | ORAL | Status: DC

## 2016-04-25 MED ORDER — APIXABAN 5 MG PO TABS: 5.0000 mg | ORAL_TABLET | Freq: Two times a day (BID) | ORAL | Status: DC

## 2016-04-25 NOTE — Patient Instructions (Addendum)
Medication Instructions:  STOP ASPIRIN    STOP MAXZIDE  START ELIQUIS 5 MG TWICE A DAY  START METOPROLOL TARTRATE 25 MG 1/2 TABLET AS NEEDED AT ONSET OF AFIB  Labwork: BMET/CBC AT [facility] ON THE FIRST FLOOR   Testing/Procedures: Your physician has requested that you have an echocardiogram. Echocardiography is a painless test that uses sound waves to create images of your heart. It provides your doctor with information about the size and shape of your heart and how well your heart's chambers and valves are working. This procedure takes approximately one hour. There are no restrictions for this procedure. [facility] [address]  Follow-Up: Your physician recommends that you schedule a follow-up appointment in: 2 MONTHS  Atrial Fibrillation Atrial fibrillation is a type of irregular or rapid heartbeat (arrhythmia). In atrial fibrillation, the heart quivers continuously in a chaotic pattern. This occurs when parts of the heart receive disorganized signals that make the heart unable to pump blood normally. This can increase the risk for stroke, heart failure, and other heart-related conditions. There are different types of atrial fibrillation, including:  Paroxysmal atrial fibrillation. This type starts suddenly, and it usually stops on its own shortly after it starts.  Persistent atrial fibrillation. This type often lasts longer than a week. It may stop on its own or with treatment.  Long-lasting persistent atrial fibrillation. This type lasts longer than 12 months.  Permanent atrial fibrillation. This type does not go away. Talk with your health care provider to learn about the type of atrial fibrillation that you have. CAUSES This condition is caused by some heart-related conditions or procedures, including:  A heart attack.  Coronary artery disease.  Heart failure.  Heart valve conditions.  High blood pressure.  Inflammation of the sac that surrounds  the heart (pericarditis).  Heart surgery.  Certain heart rhythm disorders, such as Wolf-Parkinson-White syndrome. Other causes include:  Pneumonia.  Obstructive sleep apnea.  Blockage of an artery in the lungs (pulmonary embolism, or PE).  Lung cancer.  Chronic lung disease.  Thyroid problems, especially if the thyroid is overactive (hyperthyroidism).  Caffeine.  Excessive alcohol use or illegal drug use.  Use of some medicines, including certain decongestants and diet pills. Sometimes, the cause cannot be found. RISK FACTORS This condition is more likely to develop in:  People who are older in age.  People who smoke.  People who have diabetes mellitus.  People who are overweight (obese).  Athletes who exercise vigorously. SYMPTOMS Symptoms of this condition include:  A feeling that your heart is beating rapidly or irregularly.  A feeling of discomfort or pain in your chest.  Shortness of breath.  Sudden light-headedness or weakness.  Getting tired easily during exercise. In some cases, there are no symptoms. DIAGNOSIS Your health care provider may be able to detect atrial fibrillation when taking your pulse. If detected, this condition may be diagnosed with:  An electrocardiogram (ECG).  A Holter monitor test that records your heartbeat patterns over a 24-hour period.  Transthoracic echocardiogram (TTE) to evaluate how blood flows through your heart.  Transesophageal echocardiogram (TEE) to view more detailed images of your heart.  A stress test.  Imaging tests, such as a CT scan or chest X-ray.  Blood tests. TREATMENT The main goals of treatment are to prevent blood clots from forming and to keep your heart beating at a normal rate and rhythm. The type of treatment that you receive depends on many factors, such as  your underlying medical conditions and how you feel when you are experiencing atrial fibrillation. This condition may be treated  with:  Medicine to slow down the heart rate, bring the heart's rhythm back to normal, or prevent clots from forming.  Electrical cardioversion. This is a procedure that resets your heart's rhythm by delivering a controlled, low-energy shock to the heart through your skin.  Different types of ablation, such as catheter ablation, catheter ablation with pacemaker, or surgical ablation. These procedures destroy the heart tissues that send abnormal signals. When the pacemaker is used, it is placed under your skin to help your heart beat in a regular rhythm. HOME CARE INSTRUCTIONS  Take over-the counter and prescription medicines only as told by your health care provider.  If your health care provider prescribed a blood-thinning medicine (anticoagulant), take it exactly as told. Taking too much blood-thinning medicine can cause bleeding. If you do not take enough blood-thinning medicine, you will not have the protection that you need against stroke and other problems.  Do not use tobacco products, including cigarettes, chewing tobacco, and e-cigarettes. If you need help quitting, ask your health care provider.  If you have obstructive sleep apnea, manage your condition as told by your health care provider.  Do not drink alcohol.  Do not drink beverages that contain caffeine, such as coffee, soda, and tea.  Maintain a healthy weight. Do not use diet pills unless your health care provider approves. Diet pills may make heart problems worse.  Follow diet instructions as told by your health care provider.  Exercise regularly as told by your health care provider.  Keep all follow-up visits as told by your health care provider. This is important. PREVENTION  Avoid drinking beverages that contain caffeine or alcohol.  Avoid certain medicines, especially medicines that are used for breathing problems.  Avoid certain herbs and herbal medicines, such as those that contain ephedra or ginseng.  Do  not use illegal drugs, such as cocaine and amphetamines.  Do not smoke.  Manage your high blood pressure. SEEK MEDICAL CARE IF:  You notice a change in the rate, rhythm, or strength of your heartbeat.  You are taking an anticoagulant and you notice increased bruising.  You tire more easily when you exercise or exert yourself. SEEK IMMEDIATE MEDICAL CARE IF:  You have chest pain, abdominal pain, sweating, or weakness.  You feel nauseous.  You notice blood in your vomit, bowel movement, or urine.  You have shortness of breath.  You suddenly have swollen feet and ankles.  You feel dizzy.  You have sudden weakness or numbness of the face, arm, or leg, especially on one side of the body.  You have trouble speaking, trouble understanding, or both (aphasia).  Your face or your eyelid droops on one side. These symptoms may represent a serious problem that is an emergency. Do not wait to see if the symptoms will go away. Get medical help right away. Call your local emergency services (911 in the [place] Do not drive yourself to the hospital.   This information is not intended to replace advice given to you by your health care provider. Make sure you discuss any questions you have with your health care provider.   Document Released: 09/25/2005 Document Revised: 06/16/2015 Document Reviewed: 01/20/2015 Elsevier Interactive Patient Education 2016 [employer]  Atrial Flutter Atrial flutter is a heart rhythm that can cause the heart to beat very fast (tachycardia). It originates in the upper chambers of the heart (atria).  In atrial flutter, the top chambers of the heart (atria) often beat much faster than the bottom chambers of the heart (ventricles). Atrial flutter has a regular "saw toothed" appearance in an EKG readout. An EKG is a test that records the electrical activity of the heart. Atrial flutter can cause the heart to beat up to 150 beats per minute (BPM). Atrial flutter can  either be short lived (paroxysmal) or permanent.  CAUSES  Causes of atrial flutter can be many. Some of these include:  Heart related issues:  Heart attack (myocardial infarction).  Heart failure.  Heart valve problems.  Poorly controlled high blood pressure (hypertension).  After open heart surgery.  Lung related issues:  A blood clot in the lungs (pulmonary embolism).  Chronic obstructive pulmonary disease (COPD). Medications used to treat COPD can attribute to atrial flutter.  Other related causes:  Hyperthyroidism.  Caffeine.  Some decongestant cold medications.  Low electrolyte levels such as potassium or magnesium.  Cocaine. SYMPTOMS  An awareness of your heart beating rapidly (palpitations).  Shortness of breath.  Chest pain.  Low blood pressure (hypotension).  Dizziness or fainting. DIAGNOSIS  Different tests can be performed to diagnose atrial flutter.   An EKG.  Holter monitor. This is a 24-hour recording of your heart rhythm. You will also be given a diary. Write down all symptoms that you have and what you were doing at the time you experienced symptoms.  Cardiac event monitor. This small device can be worn for up to 30 days. When you have heart symptoms, you will push a button on the device. This will then record your heart rhythm.  Echocardiogram. This is an imaging test to look at your heart. Your caregiver will look at your heart valves and the ventricles.  Stress test. This test can help determine if the atrial flutter is related to exercise or if coronary artery disease is present.  Laboratory studies will look at certain blood levels like:  Complete blood count (CBC).  Potassium.  Magnesium.  Thyroid function. TREATMENT  Treatment of atrial flutter varies. A combination of therapies may be used or sometimes atrial flutter may need only 1 type of treatment.  Lab work: If your blood work, such as your electrolytes (potassium,  magnesium) or your thyroid function tests, are abnormal, your caregiver will treat them accordingly.  Medication:  There are several different types of medications that can convert your heart to a normal rhythm and prevent atrial flutter from reoccurring.  Nonsurgical procedures: Nonsurgical techniques may be used to control atrial flutter. Some examples include:  Cardioversion. This technique uses either drugs or an electrical shock to restore a normal heart rhythm:  Cardioversion drugs may be given through an intravenous (IV) line to help "reset" the heart rhythm.  In electrical cardioversion, your caregiver shocks your heart with electrical energy. This helps to reset the heartbeat to a normal rhythm.  Ablation. If atrial flutter is a persistent problem, an ablation may be needed. This procedure is done under mild sedation. High frequency radio-wave energy is used to destroy the area of heart tissue responsible for atrial flutter. SEEK IMMEDIATE MEDICAL CARE IF:  You have:  Dizziness.  Near fainting or fainting.  Shortness of breath.  Chest pain or pressure.  Sudden nausea or vomiting.  Profuse sweating. If you have the above symptoms, call your local emergency service immediately! Do not drive yourself to the hospital. MAKE SURE YOU:   Understand these instructions.  Will watch your condition.  Will get help right away if you are not doing well or get worse.   This information is not intended to replace advice given to you by your health care provider. Make sure you discuss any questions you have with your health care provider.   Document Released: 02/11/2009 Document Revised: 10/16/2014 Document Reviewed: 04/09/2015 Elsevier Interactive Patient Education [employer]

## 2016-05-15 ENCOUNTER — Other Ambulatory Visit (HOSPITAL_COMMUNITY): Payer: Medicare Other

## 2016-05-24 ENCOUNTER — Ambulatory Visit (HOSPITAL_COMMUNITY): Payer: Medicare Other | Attending: Cardiology

## 2016-05-24 ENCOUNTER — Other Ambulatory Visit: Payer: Self-pay

## 2016-05-24 DIAGNOSIS — I071 Rheumatic tricuspid insufficiency: Secondary | ICD-10-CM | POA: Insufficient documentation

## 2016-05-24 DIAGNOSIS — I10 Essential (primary) hypertension: Secondary | ICD-10-CM | POA: Insufficient documentation

## 2016-05-24 DIAGNOSIS — I351 Nonrheumatic aortic (valve) insufficiency: Secondary | ICD-10-CM | POA: Diagnosis not present

## 2016-05-24 DIAGNOSIS — E785 Hyperlipidemia, unspecified: Secondary | ICD-10-CM | POA: Diagnosis not present

## 2016-05-24 DIAGNOSIS — I4891 Unspecified atrial fibrillation: Secondary | ICD-10-CM | POA: Diagnosis not present

## 2016-05-24 DIAGNOSIS — E119 Type 2 diabetes mellitus without complications: Secondary | ICD-10-CM | POA: Insufficient documentation

## 2016-06-28 ENCOUNTER — Ambulatory Visit (INDEPENDENT_AMBULATORY_CARE_PROVIDER_SITE_OTHER): Payer: Medicare Other | Admitting: Cardiovascular Disease

## 2016-06-28 ENCOUNTER — Encounter: Payer: Self-pay | Admitting: Cardiovascular Disease

## 2016-06-28 VITALS — BP 105/62 | HR 83 | Ht 65.0 in | Wt 155.8 lb

## 2016-06-28 DIAGNOSIS — I10 Essential (primary) hypertension: Secondary | ICD-10-CM

## 2016-06-28 DIAGNOSIS — E785 Hyperlipidemia, unspecified: Secondary | ICD-10-CM | POA: Diagnosis not present

## 2016-06-28 DIAGNOSIS — I480 Paroxysmal atrial fibrillation: Secondary | ICD-10-CM

## 2016-06-28 DIAGNOSIS — Z79899 Other long term (current) drug therapy: Secondary | ICD-10-CM

## 2016-06-28 MED ORDER — FUROSEMIDE 20 MG PO TABS: 20.0000 mg | ORAL_TABLET | Freq: Every day | ORAL | 5 refills | Status: DC

## 2016-06-28 MED ORDER — ROSUVASTATIN CALCIUM 10 MG PO TABS: 10.0000 mg | ORAL_TABLET | Freq: Every day | ORAL | 5 refills | Status: DC

## 2016-06-28 NOTE — Progress Notes (Signed)
Cardiology Office Note   Date:  06/28/2016   ID:  Ann Hobbs, DOB 1946/09/19, MRN [id number]  PCP:  Smothers, Andree Elk, NP  Cardiologist:   Skeet Latch, MD   Chief Complaint  Patient presents with  . Follow-up    Pt states experiencing  tachycardia last night. edema in left ankle caused by knee surgery.      History of Present Illness: Ann Hobbs is a 70 y.o. female with paroxysmal atrial fibrillation/flutter, diabetes, hypertension, and hyperlipidemia who presents for follow up.  Ann Hobbs was seen by Augusto Gamble, NP on 12/02/15 at which time Ann Hobbs reported palpitations and was referred to cardiology for further evaluation.  Her brother was recently diagnosed with atrial fibrillation in her mother has a pacemaker due to heart block.  TSH at the time was 1.34.  Ann Hobbs was seen in the emergency department in 2012 with an episode of heart racing to the 150s. On review of the chart it appears that Ann Hobbs received IV diltiazem which did not break the rhythm. Ann Hobbs subsequently received adenosine and returned to sinus rhythm. Her initial presenting rhythm appears to be in atrial flutter, though it was read as sinus tachycardia at the time. Her subsequent EKG showed atrial fibrillation with a rate of 100 bpm.  Ann Hobbs was instructed to follow up with cardiology, which Ann Hobbs did, but reports that he never looked at her EKGs.  He recommended that Ann Hobbs take a beta blocker but Ann Hobbs declined due to concern that it might cause fatigue.  Ann Hobbs was seen in clinic 04/2016 due to recurrent palpitations.  Given that her blood pressure is normal and her heart rate is low when in sinus rhythm, Ann Hobbs was started on metoprolol as needed. Ann Hobbs was also started on Eliquis bid. Ann Hobbs had an echo 05/2015 that revealed LVEF 60-65% and was otherwise unremarkable.   Since her last appoinment Ann Hobbs has noted 2 episodes of mild palpitations.  Her heart rate was 130s the first time and 110 the second time.  Ann Hobbs took metoprolol  and within 1-1.5 hours her symptoms subsided.  Last night Ann Hobbs felt fluttering and it seemed like her heart rate was high.  Ann Hobbs took metoprolol and her heart rate was 107.  It was still fluttering the next morning.  Ann Hobbs denies chest pain or shortness of breath. Ann Hobbs has both episodes of feeling like her heart is racing for prolonged periods of time as well as a fluttering sensation that lasts for 10-15 seconds.  Ann Hobbs denies lightheadedness or dizziness.   When Ann Hobbs has episodes of atrial fibrillation Ann Hobbs feels tired that day as well as the following day. Heart racing vs fluttering.  Ann Hobbs continues to do water aerobics 2-3 times per week and does not have any exertional symptoms. Ann Hobbs does have some mild lower extremity edema.  Past Medical History:  Diagnosis Date  . Atrial fibrillation ([place]) 04/25/2016  . Atrial flutter ([place]) 04/25/2016  . Diabetes mellitus without complication ([place])   . Essential hypertension 04/25/2016  . Hyperlipidemia 04/25/2016  . Hypertension     No past surgical history on file.   Current Outpatient Prescriptions  Medication Sig Dispense Refill  . apixaban (ELIQUIS) 5 MG TABS tablet Take 1 tablet (5 mg total) by mouth 2 (two) times daily. 60 tablet 5  . Blood Glucose Monitoring Suppl (GHT BLOOD GLUCOSE MONITOR) w/Device KIT Apply 1 strip topically as directed.    Marland Kitchen estradiol (ESTRACE) 0.1 MG/GM vaginal cream Place 1 Applicatorful vaginally as directed.    Marland Kitchen  metoprolol tartrate (LOPRESSOR) 25 MG tablet 1/2 TABLET BY MOUTH DAILY AS NEEDED FOR ONSET OF ATRIAL FIB/FLUTTER 15 tablet 5  . Multiple Vitamins-Minerals (WOMENS MULTIVITAMIN PLUS PO) Take 1 tablet by mouth daily.    . ReliOn Ultra Thin Lancets MISC Apply 1 strip topically as directed.    . furosemide (LASIX) 20 MG tablet Take 1 tablet (20 mg total) by mouth daily. 30 tablet 5  . rosuvastatin (CRESTOR) 10 MG tablet Take 1 tablet (10 mg total) by mouth daily. 30 tablet 5   No current facility-administered medications for  this visit.     Allergies:   Codeine and Prednisone    Social History:  The patient  reports that Ann Hobbs has never smoked. Ann Hobbs does not have any smokeless tobacco history on file.   Family History:  The patient's family history includes Cancer in her father.    ROS:  Please see the history of present illness.   Otherwise, review of systems are positive for none.   All other systems are reviewed and negative.    PHYSICAL EXAM: VS:  BP 105/62   Pulse 83   Ht _0  (1.651 m)   Wt 155 lb 12.8 oz (70.7 kg)   BMI 25.93 kg/m  , BMI Body mass index is 25.93 kg/m. GENERAL:  Well appearing HEENT:  Pupils equal round and reactive, fundi not visualized, oral mucosa unremarkable NECK:  No jugular venous distention, waveform within normal limits, carotid upstroke brisk and symmetric, no bruits, no thyromegaly LYMPHATICS:  No cervical adenopathy LUNGS:  Clear to auscultation bilaterally HEART:  RRR.  PMI not displaced or sustained,S1 and S2 within normal limits, no S3, no S4, no clicks, no rubs, no murmurs ABD:  Flat, positive bowel sounds normal in frequency in pitch, no bruits, no rebound, no guarding, no midline pulsatile mass, no hepatomegaly, no splenomegaly EXT:  2 plus pulses throughout, no edema, no cyanosis no clubbing SKIN:  No rashes no nodules NEURO:  Cranial nerves II through XII grossly intact, motor grossly intact throughout PSYCH:  Cognitively intact, oriented to person place and tim   EKG:  EKG is ordered today. 06/28/16: Sinus rhythm. 83 bpm. Nonspecific ST/T changes. The ekg ordered 04/25/16 demonstrates sinus rhythm.  Rate 67 bpm.   05/18/11: typical atrial flutter.  Rate 130 bpm. 05/18/11: Atrial fibrillation rate 100 bpm.  Echo 05/24/16: Study Conclusions  - Left ventricle: The cavity size was normal. Systolic function was   normal. The estimated ejection fraction was in the range of 60%   to 65%. Wall motion was normal; there were no regional wall   motion  abnormalities. Left ventricular diastolic function   parameters were normal. - Aortic valve: Trileaflet; normal thickness leaflets. There was   mild regurgitation. - Aortic root: The aortic root was normal in size. - Ascending aorta: The ascending aorta was normal in size. - Mitral valve: Structurally normal valve. There was no   regurgitation. - Left atrium: The atrium was normal in size. - Right ventricle: The cavity size was normal. Wall thickness was   normal. Systolic function was normal. - Tricuspid valve: There was trivial regurgitation. - Pulmonary arteries: Systolic pressure was within the normal   range. - Inferior vena cava: The vessel was normal in size. - Pericardium, extracardiac: There was no pericardial effusion.  Recent Labs: 04/25/2016: BUN 17; Creat 0.74; Hemoglobin 13.2; Platelets 183; Potassium 4.5; Sodium 141   Hemoglobin A1c 05/20/15: 5.7% 05/20/15: LDL 155, triglycerides 132, HDL 43  Lipid  Panel No results found for: CHOL, TRIG, HDL, CHOLHDL, VLDL, LDLCALC, LDLDIRECT    Wt Readings from Last 3 Encounters:  06/28/16 155 lb 12.8 oz (70.7 kg)  04/25/16 158 lb 3.2 oz (71.8 kg)  12/04/15 160 lb (72.6 kg)     ASSESSMENT AND PLAN:  # Paroxysmal atrial fibrillation/flutter: Ann Hobbs continues to have short episodes of atrial fibrillation. The frequency occurs as much as in the past, but the episodes do not last as long and her heart rate is better-controlled. We discussed the options including continuing what Ann Hobbs is currently doing, taking metoprolol daily, 30 antiarrhythmics, and ablation. At this time, Ann Hobbs wishes to continue what Ann Hobbs is doing by taking metoprolol as needed. If the symptoms worsen Ann Hobbs will consider alternative options.  Continue Eliquis 46m bid.  We will check an echo and CBC.  This patients CHA2DS2-VASc Score and unadjusted Ischemic Stroke Rate (% per year) is equal to 3.2 % stroke rate/year from a score of 3  Above score calculated as 1 point  each if present [CHF, HTN, DM, Vascular=MI/PAD/Aortic Plaque, Age if 65-74, or Female] Above score calculated as 2 points each if present [Age > 75, or Stroke/TIA/TE]   # Hypertension: BP is well-controlled.  Ann Hobbs does note some mild lower extremity edema, Ann Hobbs was given a prescription for Lasix 20 mg daily as needed, as Ann Hobbs no longer has her HCTZ/triamterene.  Ann Hobbs will return for a BMP in 2 weeks.   # Hyperlipidemia: Ann Hobbs' cholesterol is elevated. Her PCP has discussed importance of starting a statin, especially given the fact that Ann Hobbs has diabetes.  However, Ann Hobbs has been reluctant to do so in the past. At this time Ann Hobbs is willing to start a low-dose statin. We will start rosuvastatin 10 mg daily. We will check lipids and CMP at follow-up.  Current medicines are reviewed at length with the patient today.  The patient does not have concerns regarding medicines.  The following changes have been made:  Start rosuvastatin 10 mg daily.   Labs/ tests ordered today include:   Orders Placed This Encounter  Procedures  . Basic metabolic panel  . EKG 12-Lead     Disposition:   FU with Rogue Pautler C. ROval Linsey MD, [facility].   Time spent: 60 minutes-Greater than 50% of this time was spent in counseling, explanation of diagnosis, planning of further management, and coordination of care.  This note was written with the assistance of speech recognition software.  Please excuse any transcriptional errors.  Signed, Lyriq Finerty C. ROval Linsey MD, F[facility] 06/28/2016 1:28 PM    [facility]

## 2016-06-28 NOTE — Patient Instructions (Addendum)
Medication Instructions:  START CRESTOR 10 MG DAILY  START FUROSEMIDE 20 MG DAILY   Labwork: BMET IN  A COUPLE OF WEEKS AT [facility] ON THE FIRST FLOOR   Testing/Procedures: NONE   Follow-Up: Your physician recommends that you schedule a follow-up appointment in: 3 MONTH OV YOU WILL NEED TO COME FASTING TO THAT APPOINTMENT, WATER ONLY MORNING OF  If you need a refill on your cardiac medications before your next appointment, please call your pharmacy.

## 2016-08-03 ENCOUNTER — Other Ambulatory Visit: Payer: Self-pay | Admitting: Cardiovascular Disease

## 2016-08-03 DIAGNOSIS — E7849 Other hyperlipidemia: Secondary | ICD-10-CM

## 2016-08-03 DIAGNOSIS — Z79899 Other long term (current) drug therapy: Secondary | ICD-10-CM

## 2016-08-03 NOTE — Telephone Encounter (Signed)
New message  [place] [facility]ra [facility] needing prior approval for medication  They sent us a fax today  This is time sensitive

## 2016-08-04 NOTE — Telephone Encounter (Signed)
Reference # [id number]-Needs prior authorization for her Rosuvastatin,have some questions to ask

## 2016-08-08 NOTE — Telephone Encounter (Signed)
Received [facility] from [facility].  Called patient to inquire if she has ever tried any other statin medications as could not find any record of trying others at this time. Left message for patient to call back to discuss.

## 2016-08-08 NOTE — Telephone Encounter (Signed)
Reference # [id number]    Calling concerning prior authorization. You can call or fax this information, Fax# [phone]

## 2016-08-09 NOTE — Telephone Encounter (Signed)
Received [facility] for PA for [employer]restor Spoke with patient and she has not tried and failed any cholesterol lowering medications which is required for insurance to cover Patients husband takes a low dose Atorvastatin and has for years She is willing to try  Will forward to Pharm D for recommendations

## 2016-08-09 NOTE — Telephone Encounter (Signed)
Ann KidneyDebra from [facility] is calling because she needs prior Berkley Harveyauth and have q [phone] ref [id number] time sensitive matter needs approval quickly can call or fax

## 2016-08-09 NOTE — Telephone Encounter (Signed)
Would have her start atorvastatin 20 mg qd, repeat labs in 3 months

## 2016-08-11 MED ORDER — ATORVASTATIN CALCIUM 20 MG PO TABS: 20.0000 mg | ORAL_TABLET | Freq: Every day | ORAL | 3 refills | Status: DC

## 2016-08-11 NOTE — Telephone Encounter (Signed)
Spoke w patient. Discussed situation with the prior authorization & discussion w pharmD.  She is agreeable to change of cholesterol medication from gen crestor to gen lipitor. I have discussed plan and dose and advised patient on potential side effects. She is agreeable to trying out the lipitor and seeing how this works. I have given her recommendation on recheck of labwork and I have reordered lipid and liver tests. Rx called to local pharmacy at her preference.  Called [facility] and left message at provided number to inform them of medication change.

## 2016-08-11 NOTE — Telephone Encounter (Signed)
Ann Hobbs ( [facility]) is calling to get prior auth on generic for Crestor ( Rosuvastatin 10mg s) . The Prior Berkley Harveyauth has relapse and she is calling to see if she could get the information , because if not it can be denied for lapse of response . Please call back at [phone]. Reference #[id number].

## 2016-08-14 NOTE — Telephone Encounter (Signed)
Left another message with [facility] since they continue to send faxes regarding Crestor PA

## 2016-09-28 ENCOUNTER — Ambulatory Visit (INDEPENDENT_AMBULATORY_CARE_PROVIDER_SITE_OTHER): Payer: Medicare Other | Admitting: Cardiovascular Disease

## 2016-09-28 ENCOUNTER — Encounter: Payer: Self-pay | Admitting: Cardiovascular Disease

## 2016-09-28 VITALS — BP 125/82 | HR 71 | Ht 64.0 in | Wt 159.2 lb

## 2016-09-28 DIAGNOSIS — I480 Paroxysmal atrial fibrillation: Secondary | ICD-10-CM

## 2016-09-28 DIAGNOSIS — R5383 Other fatigue: Secondary | ICD-10-CM | POA: Diagnosis not present

## 2016-09-28 DIAGNOSIS — E7800 Pure hypercholesterolemia, unspecified: Secondary | ICD-10-CM

## 2016-09-28 DIAGNOSIS — I10 Essential (primary) hypertension: Secondary | ICD-10-CM | POA: Diagnosis not present

## 2016-09-28 DIAGNOSIS — Z8639 Personal history of other endocrine, nutritional and metabolic disease: Secondary | ICD-10-CM

## 2016-09-28 LAB — BASIC METABOLIC PANEL
BUN: 14 mg/dL (ref 7–25)
CALCIUM: 8.8 mg/dL (ref 8.6–10.4)
CO2: 28 mmol/L (ref 20–31)
Chloride: 104 mmol/L (ref 98–110)
Creat: 0.67 mg/dL (ref 0.50–0.99)
GLUCOSE: 110 mg/dL — AB (ref 65–99)
Potassium: 4.1 mmol/L (ref 3.5–5.3)
SODIUM: 139 mmol/L (ref 135–146)

## 2016-09-28 NOTE — Patient Instructions (Addendum)
Medication Instructions:  Your physician recommends that you continue on your current medications as directed. Please refer to the Current Medication list given to you today.  Labwork: none  Testing/Procedures: none  Follow-Up: Your physician wants you to follow-up in: 6 month ov You will receive a reminder letter in the mail two months in advance. If you don't receive a letter, please call our office to schedule the follow-up appointment.  Any Other Special Instructions Will Be Listed Below (If Applicable). Will call lab to see if a Vitamin D level can be added  If the Xarelto is a better choice for you with your insurance call the office so it can be changed in your chart [phone]  If you need a refill on your cardiac medications before your next appointment, please call your pharmacy.

## 2016-09-28 NOTE — Progress Notes (Signed)
Cardiology Office Note   Date:  09/28/2016   ID:  Ann Hobbs, DOB July 14, 1946, MRN [id number]  PCP:  Smothers, Andree Elk, NP  Cardiologist:   Skeet Latch, MD   Chief Complaint  Patient presents with  . Follow-up     Pt states no Sx.      History of Present Illness: Ann Hobbs is a 70 y.o. female with paroxysmal atrial fibrillation/flutter, diabetes, hypertension, and hyperlipidemia who presents for follow up.  [name] was seen by Augusto Gamble, NP on 12/02/15 at which time she reported palpitations and was referred to cardiology for further evaluation.  Her brother was recently diagnosed with atrial fibrillation in her mother has a pacemaker due to heart block.  TSH at the time was 1.34.  She was seen in the emergency department in 2012 with an episode of heart racing to the 150s. On review of the chart it appears that she received IV diltiazem which did not break the rhythm. She subsequently received adenosine and returned to sinus rhythm. Her initial presenting rhythm appears to be in atrial flutter, though it was read as sinus tachycardia at the time. Her subsequent EKG showed atrial fibrillation with a rate of 100 bpm.  She was instructed to follow up with cardiology, which she did, but reports that he never looked at her EKGs.  He recommended that she take a beta blocker but she declined due to concern that it might cause fatigue.  She was seen in clinic 04/2016 due to recurrent palpitations.  Given that her blood pressure is normal and her heart rate is low when in sinus rhythm, she was started on metoprolol as needed. She was also started on Eliquis bid. She had an echo 05/2015 that revealed LVEF 60-65% and was otherwise unremarkable. Thyroid function was within normal limits 11/2015.  Since her last appointment [name] has noted three episodes of atrial fibrillation. The most recent was a couple weeks ago.  The first two episodes were responsive to  metoprolol.  The last episode continued after one dose and she took an additional dose several hours later.  By the next day she was back in sinus rhythm.  She notes feeling palpitations in her throat and short of breath when in atrial fibrillation.  She denies chest pain, shortness of breath or lightheadedness.  At her last appointment she was started on rosuvastatin but instead started atorvastatin 47m, as it was less expensive with her insurance.    Past Medical History:  Diagnosis Date  . Atrial fibrillation ([place] 04/25/2016  . Atrial [place]utter ([place] 04/25/2016  . Diabetes mellitus without complication ([place]   . Essential hypertension 04/25/2016  . Hyperlipidemia 04/25/2016  . Hypertension     No past surgical history on file.   Current Outpatient Prescriptions  Medication Sig Dispense Refill  . apixaban (ELIQUIS) 5 MG TABS tablet Take 1 tablet (5 mg total) by mouth 2 (two) times daily. 60 tablet 5  . atorvastatin (LIPITOR) 20 MG tablet Take 1 tablet (20 mg total) by mouth daily. 30 tablet 3  . Blood Glucose Monitoring Suppl (GHT BLOOD GLUCOSE MONITOR) w/Device KIT Apply 1 strip topically as directed.    .Marland Kitchenestradiol (ESTRACE) 0.1 MG/GM vaginal cream Place 1 Applicatorful vaginally as directed.    . furosemide (LASIX) 20 MG tablet Take 1 tablet (20 mg total) by mouth daily. 30 tablet 5  . metoprolol tartrate (LOPRESSOR) 25 MG tablet 1/2 TABLET BY MOUTH DAILY AS NEEDED FOR ONSET OF  ATRIAL FIB/FLUTTER 15 tablet 5  . Multiple Vitamins-Minerals (WOMENS MULTIVITAMIN PLUS PO) Take 1 tablet by mouth daily.    . ReliOn Ultra Thin Lancets MISC Apply 1 strip topically as directed.     No current facility-administered medications for this visit.     Allergies:   Codeine and Prednisone    Social History:  The patient  reports that she has never smoked. She does not have any smokeless tobacco history on file.   Family History:  The patient's family history includes Cancer in her father.     ROS:  Please see the history of present illness.   Otherwise, review of systems are positive for none.   All other systems are reviewed and negative.    PHYSICAL EXAM: VS:  BP 125/82   Pulse 71   Ht _0  (1.626 m)   Wt 72.2 kg (159 lb 3.2 oz)   BMI 27.33 kg/m  , BMI Body mass index is 27.33 kg/m. GENERAL:  Well appearing HEENT:  Pupils equal round and reactive, fundi not visualized, oral mucosa unremarkable NECK:  No jugular venous distention, waveform within normal limits, carotid upstroke brisk and symmetric, no bruits LYMPHATICS:  No cervical adenopathy LUNGS:  Clear to auscultation bilaterally HEART: Irregularly irregular.  PMI not displaced or sustained,S1 and S2 within normal limits, no S3, no S4, no clicks, no rubs, no murmurs ABD:  Flat, positive bowel sounds normal in frequency in pitch, no bruits, no rebound, no guarding, no midline pulsatile mass, no hepatomegaly, no splenomegaly EXT:  2 plus pulses throughout, no edema, no cyanosis no clubbing SKIN:  No rashes no nodules NEURO:  Cranial nerves II through XII grossly intact, motor grossly intact throughout PSYCH:  Cognitively intact, oriented to person place and tim   EKG:  EKG is ordered today. 06/28/16: Sinus rhythm. 83 bpm. Nonspecific ST/T changes. The ekg ordered 04/25/16 demonstrates sinus rhythm.  Rate 67 bpm.   05/18/11: typical atrial flutter.  Rate 130 bpm. 05/18/11: Atrial fibrillation rate 100 bpm.  Echo 05/24/16: Study Conclusions  - Left ventricle: The cavity size was normal. Systolic function was   normal. The estimated ejection fraction was in the range of 60%   to 65%. Wall motion was normal; there were no regional wall   motion abnormalities. Left ventricular diastolic function   parameters were normal. - Aortic valve: Trileaflet; normal thickness leaflets. There was   mild regurgitation. - Aortic root: The aortic root was normal in size. - Ascending aorta: The ascending aorta was normal in  size. - Mitral valve: Structurally normal valve. There was no   regurgitation. - Left atrium: The atrium was normal in size. - Right ventricle: The cavity size was normal. Wall thickness was   normal. Systolic function was normal. - Tricuspid valve: There was trivial regurgitation. - Pulmonary arteries: Systolic pressure was within the normal   range. - Inferior vena cava: The vessel was normal in size. - Pericardium, extracardiac: There was no pericardial effusion.  Recent Labs: 04/25/2016: BUN 17; Creat 0.74; Hemoglobin 13.2; Platelets 183; Potassium 4.5; Sodium 141   Hemoglobin A1c 05/20/15: 5.7% 05/20/15: LDL 155, triglycerides 132, HDL 43  Lipid Panel No results found for: CHOL, TRIG, HDL, CHOLHDL, VLDL, LDLCALC, LDLDIRECT    Wt Readings from Last 3 Encounters:  09/28/16 72.2 kg (159 lb 3.2 oz)  06/28/16 70.7 kg (155 lb 12.8 oz)  04/25/16 71.8 kg (158 lb 3.2 oz)     ASSESSMENT AND PLAN:  # Paroxysmal atrial  fibrillation/flutter: Ms. Nowotny continues to have short episodes of atrial fibrillation. It seems to occur less frequently than in the past.  She is having atrial fibrillation now with rates in the 120-130s.  This started after her vitals were obtained.  I suggested that she take her metoprolol when she goes home.  She notes that Eliquis has been expensive and wants to see what her insurance will charge for Xarelto.  We discussed the importance of minimizing NSAID use on either of the medications.  She remains uninterested in daily nodal agents, antiarrhythmics or ablation. Continue Eliquis 51m bid for now and we will also help her apply for their patient assistance program.  This patients CHA2DS2-VASc Score and unadjusted Ischemic Stroke Rate (% per year) is equal to 3.2 % stroke rate/year from a score of 3  Above score calculated as 1 point each if present [CHF, HTN, DM, Vascular=MI/PAD/Aortic Plaque, Age if 65-74, or Female] Above score calculated as 2 points each if  present [Age > 75, or Stroke/TIA/TE]   # Hypertension: BP is well-controlled.  Metoprolol and lasix prn.   # Hyperlipidemia: Continue atorvastatin.  Check lipids and CMP.  Current medicines are reviewed at length with the patient today.  The patient does not have concerns regarding medicines.  The following changes have been made:  none  Labs/ tests ordered today include:   No orders of the defined types were placed in this encounter.    Disposition:   FU with Shantanique Hodo C. ROval Linsey MD, [facility]in [facility] months.   Time spent: 60 minutes-Greater than 50% of this time was spent in counseling, explanation of diagnosis, planning of further management, and coordination of care.  This note was written with the assistance of speech recognition software.  Please excuse any transcriptional errors.  Signed, Jamesetta Greenhalgh C. ROval Linsey MD, FT[facility] 09/28/2016 9:57 AM    [facility]

## 2016-11-02 ENCOUNTER — Other Ambulatory Visit: Payer: Self-pay | Admitting: Cardiovascular Disease

## 2016-11-02 NOTE — Telephone Encounter (Signed)
Rx request sent to pharmacy.  

## 2016-11-02 NOTE — Telephone Encounter (Signed)
Please review for refill. Thanks!  

## 2016-12-04 DIAGNOSIS — M8580 Other specified disorders of bone density and structure, unspecified site: Secondary | ICD-10-CM | POA: Insufficient documentation

## 2016-12-13 ENCOUNTER — Other Ambulatory Visit: Payer: Self-pay | Admitting: Cardiovascular Disease

## 2016-12-13 NOTE — Telephone Encounter (Signed)
Can you please review for refill. Thanks! 

## 2016-12-19 DIAGNOSIS — R195 Other fecal abnormalities: Secondary | ICD-10-CM | POA: Insufficient documentation

## 2017-01-08 ENCOUNTER — Other Ambulatory Visit: Payer: Self-pay | Admitting: Cardiovascular Disease

## 2017-01-08 NOTE — Telephone Encounter (Signed)
Refill Request.  

## 2017-01-08 NOTE — Telephone Encounter (Signed)
Rx request sent to pharmacy.  

## 2017-02-15 ENCOUNTER — Other Ambulatory Visit: Payer: Self-pay | Admitting: *Deleted

## 2017-02-15 MED ORDER — ATORVASTATIN CALCIUM 20 MG PO TABS: 20.0000 mg | ORAL_TABLET | Freq: Every day | ORAL | 3 refills | Status: DC

## 2017-03-20 ENCOUNTER — Encounter: Payer: Self-pay | Admitting: Cardiovascular Disease

## 2017-03-20 ENCOUNTER — Ambulatory Visit (INDEPENDENT_AMBULATORY_CARE_PROVIDER_SITE_OTHER): Payer: Medicare Other | Admitting: Cardiovascular Disease

## 2017-03-20 VITALS — BP 149/82 | HR 65 | Ht 65.0 in | Wt 162.2 lb

## 2017-03-20 DIAGNOSIS — I480 Paroxysmal atrial fibrillation: Secondary | ICD-10-CM | POA: Diagnosis not present

## 2017-03-20 DIAGNOSIS — E7800 Pure hypercholesterolemia, unspecified: Secondary | ICD-10-CM | POA: Diagnosis not present

## 2017-03-20 DIAGNOSIS — I10 Essential (primary) hypertension: Secondary | ICD-10-CM | POA: Diagnosis not present

## 2017-03-20 MED ORDER — DRONEDARONE HCL 400 MG PO TABS: 400.0000 mg | ORAL_TABLET | Freq: Two times a day (BID) | ORAL | 5 refills | Status: DC

## 2017-03-20 NOTE — Progress Notes (Signed)
Cardiology Office Note   Date:  03/21/2017   ID:  Ann Hobbs, DOB 04/09/1946, MRN [id number]  PCP:  Smothers, Andree Elk, NP  Cardiologist:   Skeet Latch, MD   Chief Complaint  Patient presents with  . Follow-up     History of Present Illness: Ann Hobbs is a 71 y.o. female with paroxysmal atrial fibrillation/flutter, diabetes, hypertension, and hyperlipidemia who presents for follow up.  Ann Hobbs presented to the ED in 2012 with atrial flutter that was not diagnosed. She was lost to follow up.  She established care in cardiology clinic 04/2016 due to recurrent palpitations.  Given that her blood pressure is normal and her heart rate is low when in sinus rhythm, she was started on metoprolol as needed. She was also started on Eliquis bid. She had an echo 05/2015 that revealed LVEF 60-65% and was otherwise unremarkable. Thyroid function was within normal limits 11/2015.  Since her last appointment Ann Hobbs has  Been doing well.  She reports approximately twice per month.  She takes metoprolol when this occurs.  It usually happens at night and goes away by the AM.  However she has experienced a couple times during the day.  She feels palpitations and fatigued when it occurs.  She denies chest pain or shortness of breath.  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  She has edema after these car rides.  She denies orthopnea or PND, and it improves with elevation of her legs.  Her BP is typically low.  She checks it when she visits the drug store.  She hasn't been exercising much lately due to frequent travel.    Past Medical History:  Diagnosis Date  . Atrial fibrillation ([place]) 04/25/2016  . Atrial flutter ([place]) 04/25/2016  . Diabetes mellitus without complication ([place])   . Essential hypertension 04/25/2016  . Hyperlipidemia 04/25/2016  . Hypertension     No past surgical history on file.   Current  Outpatient Prescriptions  Medication Sig Dispense Refill  . atorvastatin (LIPITOR) 20 MG tablet Take 1 tablet (20 mg total) by mouth daily at 6 PM. 90 tablet 3  . Blood Glucose Monitoring Suppl (GHT BLOOD GLUCOSE MONITOR) w/Device KIT Apply 1 strip topically as directed.    . dronedarone (MULTAQ) 400 MG tablet Take 1 tablet (400 mg total) by mouth 2 (two) times daily with a meal. 60 tablet 5  . ELIQUIS 5 MG TABS tablet TAKE ONE TABLET TWICE DAILY 60 tablet 3  . estradiol (ESTRACE) 0.1 MG/GM vaginal cream Place 1 Applicatorful vaginally as directed.    . furosemide (LASIX) 20 MG tablet TAKE ONE (1) TABLET EACH DAY 30 tablet 6  . metoprolol tartrate (LOPRESSOR) 25 MG tablet 1/2 TABLET BY MOUTH DAILY AS NEEDED FOR ONSET OF ATRIAL FIB/FLUTTER 15 tablet 5  . Multiple Vitamins-Minerals (WOMENS MULTIVITAMIN PLUS PO) Take 1 tablet by mouth daily.    . ReliOn Ultra Thin Lancets MISC Apply 1 strip topically as directed.     No current facility-administered medications for this visit.     Allergies:   Codeine and Prednisone    Social History:  The patient  reports that she has never smoked. She has never used smokeless tobacco.   Family History:  The patient's family history includes Cancer in her father.    ROS:  Please see the history of present illness.   Otherwise, review of systems are positive for knee pain.  All other systems are reviewed and negative.    PHYSICAL EXAM: VS:  BP (!) 149/82   Pulse 65   Ht _0  (1.651 m)   Wt 73.6 kg (162 lb 3.2 oz)   BMI 26.99 kg/m  , BMI Body mass index is 26.99 kg/m. GENERAL:  Well appearing.  No acute distress. HEENT:  Pupils equal round and reactive, fundi not visualized, oral mucosa unremarkable NECK:  No jugular venous distention, waveform within normal limits, carotid upstroke brisk and symmetric, no bruits LUNGS:  Clear to auscultation bilaterally.  No crackles, wheezes or rhonchi.   HEART: RRR.  PMI not displaced or sustained,S1 and S2  within normal limits, no S3, no S4, no clicks, no rubs, no murmurs ABD:  Flat, positive bowel sounds normal in frequency in pitch, no bruits, no rebound, no guarding, no midline pulsatile mass, no hepatomegaly, no splenomegaly EXT:  2 plus pulses throughout, no edema, no cyanosis no clubbing SKIN:  No rashes no nodules NEURO:  Cranial nerves II through XII grossly intact, motor grossly intact throughout PSYCH:  Cognitively intact, oriented to person place and tim   EKG:  EKG is ordered today. 03/20/17: Sinus rhythm.  Rate 65 bpm. 06/28/16: Sinus rhythm. 83 bpm. Nonspecific ST/T changes. The ekg ordered 04/25/16 demonstrates sinus rhythm.  Rate 67 bpm. 05/18/11: typical atrial flutter.  Rate 130 bpm. 05/18/11: Atrial fibrillation rate 100 bpm.  Echo 05/24/16: Study Conclusions  - Left ventricle: The cavity size was normal. Systolic function was   normal. The estimated ejection fraction was in the range of 60%   to 65%. Wall motion was normal; there were no regional wall   motion abnormalities. Left ventricular diastolic function   parameters were normal. - Aortic valve: Trileaflet; normal thickness leaflets. There was   mild regurgitation. - Aortic root: The aortic root was normal in size. - Ascending aorta: The ascending aorta was normal in size. - Mitral valve: Structurally normal valve. There was no   regurgitation. - Left atrium: The atrium was normal in size. - Right ventricle: The cavity size was normal. Wall thickness was   normal. Systolic function was normal. - Tricuspid valve: There was trivial regurgitation. - Pulmonary arteries: Systolic pressure was within the normal   range. - Inferior vena cava: The vessel was normal in size. - Pericardium, extracardiac: There was no pericardial effusion.  Recent Labs: 04/25/2016: Hemoglobin 13.2; Platelets 183 09/28/2016: BUN 14; Creat 0.67; Potassium 4.1; Sodium 139   Hemoglobin A1c 05/20/15: 5.7% 05/20/15: LDL 155, triglycerides 132,  HDL 43  Lipid Panel No results found for: CHOL, TRIG, HDL, CHOLHDL, VLDL, LDLCALC, LDLDIRECT    Wt Readings from Last 3 Encounters:  03/20/17 73.6 kg (162 lb 3.2 oz)  09/28/16 72.2 kg (159 lb 3.2 oz)  06/28/16 70.7 kg (155 lb 12.8 oz)     ASSESSMENT AND PLAN:  # Paroxysmal atrial fibrillation/flutter: Ms. Johal continues to have short episodes of atrial fibrillation.  She is currently in atrial fibrillation and symptomatic.  Her heart rate and BP are typically low, though not today.  We will start dronedarone 434m bid.  Check BMP and magnesium.  Continue metoprolol as needed and Eliuis.  This patients CHA2DS2-VASc Score and unadjusted Ischemic Stroke Rate (% per year) is equal to 3.2 % stroke rate/year from a score of 3  Above score calculated as 1 point each if present [CHF, HTN, DM, Vascular=MI/PAD/Aortic Plaque, Age if 65-74, or Female] Above score calculated as 2 points each if present [  Age > 59, or Stroke/TIA/TE]   # Hypertension: BP is above goal today but she reports it has been controlled at home.  We discussed limiting salt and increased exercise.  Keep BP log at home.   # Hyperlipidemia: Continue atorvastatin.    Current medicines are reviewed at length with the patient today.  The patient does not have concerns regarding medicines.  The following changes have been made:  Start dronedarone  Labs/ tests ordered today include:   Orders Placed This Encounter  Procedures  . Basic metabolic panel  . Magnesium  . EKG 12-Lead     Disposition:   FU with Bryten Maher C. Oval Linsey, MD, [facility] in 4 weeks.    This note was written with the assistance of speech recognition software.  Please excuse any transcriptional errors.  Signed, Alexsis Kathman C. Oval Linsey, MD, [facility]  03/21/2017 10:45 PM    [facility]

## 2017-03-20 NOTE — Patient Instructions (Signed)
Medication Instructions:  START MULTAQ 400 MG TWICE A DAY   Labwork: NONE  Testing/Procedures: NONE  Follow-Up: Your physician recommends that you schedule a follow-up appointment in: 1 MONTH OV  If you need a refill on your cardiac medications before your next appointment, please call your pharmacy.

## 2017-04-06 ENCOUNTER — Other Ambulatory Visit: Payer: Self-pay | Admitting: Cardiovascular Disease

## 2017-04-06 NOTE — Telephone Encounter (Signed)
Please review for refill, thanks ! 

## 2017-04-12 ENCOUNTER — Encounter: Payer: Self-pay | Admitting: Cardiovascular Disease

## 2017-05-03 NOTE — Telephone Encounter (Signed)
05-03-17 pt sent mychart message 04-12-17 and would like to hear back today, has appt 05-08-17 that she doesn't need if she doesn't change med -would like answer on mychart, but if prefer to call [phone]

## 2017-05-08 ENCOUNTER — Ambulatory Visit: Payer: Medicare Other | Admitting: Cardiovascular Disease

## 2017-05-10 ENCOUNTER — Other Ambulatory Visit: Payer: Self-pay | Admitting: Cardiovascular Disease

## 2017-05-10 NOTE — Telephone Encounter (Signed)
Please review for refill, thanks ! 

## 2017-06-19 ENCOUNTER — Other Ambulatory Visit: Payer: Self-pay | Admitting: Cardiovascular Disease

## 2017-06-19 NOTE — Telephone Encounter (Signed)
Please review for refill, thanks ! 

## 2017-06-19 NOTE — Telephone Encounter (Signed)
REFILL 

## 2017-07-20 ENCOUNTER — Other Ambulatory Visit: Payer: Self-pay | Admitting: Cardiovascular Disease

## 2017-07-20 MED ORDER — APIXABAN 5 MG PO TABS: 5.0000 mg | ORAL_TABLET | Freq: Two times a day (BID) | ORAL | 1 refills | Status: DC

## 2017-07-20 NOTE — Telephone Encounter (Signed)
°*  STAT* If patient is at the pharmacy, call can be transferred to refill team.   1. Which medications need to be refilled? (please list name of each medication and dose if known)Eliquis  2. Which pharmacy/location (including street and city if local pharmacy) is medication to be sent to?[facility]   3. Do they need a 30 day or 90 day supply? 64  Patient is going out of town in a Few

## 2017-07-20 NOTE — Telephone Encounter (Signed)
Rx(s) sent to pharmacy electronically.  

## 2017-12-05 ENCOUNTER — Encounter: Payer: Self-pay | Admitting: Cardiovascular Disease

## 2017-12-11 ENCOUNTER — Other Ambulatory Visit: Payer: Self-pay | Admitting: Cardiovascular Disease

## 2017-12-11 NOTE — Telephone Encounter (Signed)
Please review for refill. Thanks!  

## 2017-12-24 ENCOUNTER — Encounter: Payer: Self-pay | Admitting: Cardiovascular Disease

## 2017-12-24 ENCOUNTER — Ambulatory Visit (INDEPENDENT_AMBULATORY_CARE_PROVIDER_SITE_OTHER): Payer: Medicare Other | Admitting: Cardiovascular Disease

## 2017-12-24 ENCOUNTER — Other Ambulatory Visit: Payer: Self-pay | Admitting: *Deleted

## 2017-12-24 VITALS — BP 129/69 | HR 134 | Ht 64.5 in | Wt 163.2 lb

## 2017-12-24 DIAGNOSIS — I480 Paroxysmal atrial fibrillation: Secondary | ICD-10-CM | POA: Diagnosis not present

## 2017-12-24 DIAGNOSIS — E7800 Pure hypercholesterolemia, unspecified: Secondary | ICD-10-CM

## 2017-12-24 DIAGNOSIS — I10 Essential (primary) hypertension: Secondary | ICD-10-CM | POA: Diagnosis not present

## 2017-12-24 MED ORDER — ATORVASTATIN CALCIUM 20 MG PO TABS: 20.0000 mg | ORAL_TABLET | Freq: Every day | ORAL | 3 refills | Status: DC

## 2017-12-24 MED ORDER — METOPROLOL TARTRATE 25 MG PO TABS: ORAL_TABLET | ORAL | 1 refills | Status: DC

## 2017-12-24 NOTE — Telephone Encounter (Signed)
Patient in office for visit today and requested refill on Atorvastatin. Rx sent to pharmacy

## 2017-12-24 NOTE — Patient Instructions (Addendum)
Medication Instructions:  START TAKING THE METOPROLOL TART 25 MG 1/2 TABLET TWICE A DAY. IF YOUR HEART RATE REMAINS HIGH YOU CAN INCREASE TO 25 MG TWICE A DAY   Labwork: NONE   Testing/Procedures: Your physician has recommended that you wear a holter monitor. Holter monitors are medical devices that record the heart's electrical activity. Doctors most often use these monitors to diagnose arrhythmias. Arrhythmias are problems with the speed or rhythm of the heartbeat. The monitor is a small, portable device. You can wear one while you do your normal daily activities. This is usually used to diagnose what is causing palpitations/syncope (passing out). 24 HOUR HOLTER [facility] AT [address]   Follow-Up: Your physician recommends that you schedule a follow-up appointment in: A FIB CLINIC LATE NEXT WEEK  Your physician recommends that you schedule a follow-up appointment in: [facility] OV [facility]   If you need a refill on your cardiac medications before your next appointment, please call your pharmacy.

## 2017-12-24 NOTE — Progress Notes (Addendum)
Cardiology Office Note   Date:  12/24/2017   ID:  Ann Hobbs, DOB 25-Aug-1946, MRN [id number]  PCP:  Smothers, Andree Elk, NP  Cardiologist:   Skeet Latch, MD   Chief Complaint  Patient presents with  . Follow-up     History of Present Illness: Ann Hobbs is a 72 y.o. female with paroxysmal atrial fibrillation/flutter, diabetes, hypertension, and hyperlipidemia who presents for follow up.  Ann Hobbs presented to the ED in 2012 with atrial flutter that was not diagnosed. She was lost to follow up.  She established care in cardiology clinic 04/2016 due to recurrent palpitations.  Given that her blood pressure is normal and her heart rate is low when in sinus rhythm, she was started on metoprolol as needed. She was also started on Eliquis bid. She had an echo 05/2015 that revealed LVEF 60-65% and was otherwise unremarkable. Thyroid function was within normal limits 11/2015.  At her last appointment Ann Hobbs was prescribed dronedarone 03/2017.  However she did not start it due to cost concerns.  She has noted more frequent episodes of atrial fibrillation.  The episodes seem to last for longer periods and occur more frequently.  She hasn't been drinking caffeine or alcohol.  She notes that it typically occurs when she bends down and stands up.  It also happens sometimes after eating.  The episodes last for hours at a time.  She takes and extra metoprolol 12.'5mg'$  in addition to the dose she takes each evening.  She has no associated chest pain but does get more short of breath.  She notes that her brother had an atrial fibrillation ablation and she wonders if she would be a candidate.  When in sinus rhythm her heart rates are mostly in the 50s-70s.  She has some mild left lower extremity edema that she attributes to her knee.  She has no orthopnea or PND.   Past Medical History:  Diagnosis Date  . Atrial fibrillation ([place]) 04/25/2016  . Atrial flutter ([place]) 04/25/2016  .  Diabetes mellitus without complication ([place])   . Essential hypertension 04/25/2016  . Hyperlipidemia 04/25/2016  . Hypertension     History reviewed. No pertinent surgical history.   Current Outpatient Medications  Medication Sig Dispense Refill  . apixaban (ELIQUIS) 5 MG TABS tablet Take 1 tablet (5 mg total) by mouth 2 (two) times daily. 180 tablet 1  . atorvastatin (LIPITOR) 20 MG tablet Take 1 tablet (20 mg total) by mouth daily at 6 PM. 90 tablet 3  . Blood Glucose Monitoring Suppl (GHT BLOOD GLUCOSE MONITOR) w/Device KIT Apply 1 strip topically as directed.    . furosemide (LASIX) 20 MG tablet TAKE ONE (1) TABLET EACH DAY 30 tablet 6  . metoprolol tartrate (LOPRESSOR) 25 MG tablet TAKE 1/2 TABLET BY MOUTH TWICE A DAY 90 tablet 1  . Multiple Vitamins-Minerals (WOMENS MULTIVITAMIN PLUS PO) Take 1 tablet by mouth daily.    . ReliOn Ultra Thin Lancets MISC Apply 1 strip topically as directed.     No current facility-administered medications for this visit.     Allergies:   Codeine and Prednisone    Social History:  The patient  reports that  has never smoked. she has never used smokeless tobacco.   Family History:  The patient's family history includes Cancer in her father.    ROS:  Please see the history of present illness.   Otherwise, review of systems are positive for knee pain.   All other  systems are reviewed and negative.    PHYSICAL EXAM: VS:  BP 129/69   Pulse (!) 134   Ht 5' 4.5" (1.638 m)   Wt 163 lb 3.2 oz (74 kg)   BMI 27.58 kg/m  , BMI Body mass index is 27.58 kg/m. GENERAL:  Well appearing.  No acute distress. HEENT:  Pupils equal round and reactive, fundi not visualized, oral mucosa unremarkable NECK:  No jugular venous distention, waveform within normal limits, carotid upstroke brisk and symmetric, no bruits LUNGS:  Clear to auscultation bilaterally.  No crackles, wheezes or rhonchi.   HEART: Irregularly irregular.  Tachycardic.  PMI not displaced or  sustained,S1 and S2 within normal limits, no S3, no S4, no clicks, no rubs, no murmurs ABD:  Flat, positive bowel sounds normal in frequency in pitch, no bruits, no rebound, no guarding, no midline pulsatile mass, no hepatomegaly, no splenomegaly EXT:  2 plus pulses throughout, no edema, no cyanosis no clubbing SKIN:  No rashes no nodules NEURO:  Cranial nerves II through XII grossly intact, motor grossly intact throughout PSYCH:  Cognitively intact, oriented to person place and tim   EKG:  EKG is ordered today. 03/20/17: Sinus rhythm.  Rate 65 bpm. 06/28/16: Sinus rhythm. 83 bpm. Nonspecific ST/T changes. The ekg ordered 04/25/16 demonstrates sinus rhythm.  Rate 67 bpm. 05/18/11: typical atrial flutter.  Rate 130 bpm. 05/18/11: Atrial fibrillation rate 100 bpm. 12/24/17: Atrial fibrillation rate 127 bpm  Echo 05/24/16: Study Conclusions  - Left ventricle: The cavity size was normal. Systolic function was   normal. The estimated ejection fraction was in the range of 60%   to 65%. Wall motion was normal; there were no regional wall   motion abnormalities. Left ventricular diastolic function   parameters were normal. - Aortic valve: Trileaflet; normal thickness leaflets. There was   mild regurgitation. - Aortic root: The aortic root was normal in size. - Ascending aorta: The ascending aorta was normal in size. - Mitral valve: Structurally normal valve. There was no   regurgitation. - Left atrium: The atrium was normal in size. - Right ventricle: The cavity size was normal. Wall thickness was   normal. Systolic function was normal. - Tricuspid valve: There was trivial regurgitation. - Pulmonary arteries: Systolic pressure was within the normal   range. - Inferior vena cava: The vessel was normal in size. - Pericardium, extracardiac: There was no pericardial effusion.  Recent Labs: No results found for requested labs within last 8760 hours.   05/20/15:  LDL 155, triglycerides 132, HDL  43 Hemoglobin A1c 05/20/15: 5.7%12/04/16:  12/04/16:  Total cholesterol 126, triglycerides 76, HDL 42, LDL 76  Lipid Panel No results found for: CHOL, TRIG, HDL, CHOLHDL, VLDL, LDLCALC, LDLDIRECT    Wt Readings from Last 3 Encounters:  12/24/17 163 lb 3.2 oz (74 kg)  03/20/17 162 lb 3.2 oz (73.6 kg)  09/28/16 159 lb 3.2 oz (72.2 kg)     ASSESSMENT AND PLAN:  # Paroxysmal atrial fibrillation/flutter: Ann Hobbs is having more frequent episodes of atrial fibrillation.  She notes palpitations and dyspnea. She has only been taking metoprolol at night.  She will start taking 12.'5mg'$  bid.  She can increase this to '25mg'$  if her heart rate remains elevated.  She can also take an extra '25mg'$  as needed.  We have been limited in titrating her nodal agent 2/2 low heart rate in sinus rhythm.  We will refer her to atrial fibrillation clinic for consideration of ablation or antiarrhythmics.  Continue  Eliquis.  This patients CHA2DS2-VASc Score and unadjusted Ischemic Stroke Rate (% per year) is equal to 3.2 % stroke rate/year from a score of 3  Above score calculated as 1 point each if present [CHF, HTN, DM, Vascular=MI/PAD/Aortic Plaque, Age if 65-74, or Female] Above score calculated as 2 points each if present [Age > 75, or Stroke/TIA/TE]  # Hypertension: BP is controlled.  Increase metoprolol as above.    # Hyperlipidemia: Continue atorvastatin.  LDL was 69 11/2016.  She is due for repeat at follow-up with her PCP.  # Orthostatic hypotension: Heart rate dropped from 136 to 69 upon standing.  She remained in atrial fibrillation.  We will get a 24 hour Holter.   Current medicines are reviewed at length with the patient today.  The patient does not have concerns regarding medicines.  The following changes have been made: Increase metoprolol as above.   Labs/ tests ordered today include:   Orders Placed This Encounter  Procedures  . Amb Referral to AFIB Clinic  . EKG 12-Lead     Disposition:    FU with Ann Loveall C. Oval Linsey, MD, [facility] in 3 months.  Atrial fibrillation clinic next week.   This note was written with the assistance of speech recognition software.  Please excuse any transcriptional errors.  Signed, Estevan Kersh C. Oval Linsey, MD, [facility]  12/24/2017 3:49 PM    [place]

## 2017-12-24 NOTE — Addendum Note (Signed)
Addended by: Regis BillPRATT, Shyheim Tanney B on: 12/24/2017 04:02 PM   Modules accepted: Orders

## 2018-01-04 ENCOUNTER — Encounter (HOSPITAL_COMMUNITY): Payer: Self-pay | Admitting: Nurse Practitioner

## 2018-01-04 ENCOUNTER — Ambulatory Visit (HOSPITAL_COMMUNITY)
Admission: RE | Admit: 2018-01-04 | Discharge: 2018-01-04 | Disposition: A | Discharge status: Home / Self Care | Payer: Medicare Other | Source: Ambulatory Visit | Attending: Nurse Practitioner | Admitting: Nurse Practitioner

## 2018-01-04 VITALS — BP 142/84 | HR 74 | Ht 64.5 in | Wt 164.0 lb

## 2018-01-04 DIAGNOSIS — E785 Hyperlipidemia, unspecified: Secondary | ICD-10-CM | POA: Diagnosis not present

## 2018-01-04 DIAGNOSIS — I4891 Unspecified atrial fibrillation: Secondary | ICD-10-CM | POA: Diagnosis present

## 2018-01-04 DIAGNOSIS — E119 Type 2 diabetes mellitus without complications: Secondary | ICD-10-CM | POA: Insufficient documentation

## 2018-01-04 DIAGNOSIS — Z885 Allergy status to narcotic agent status: Secondary | ICD-10-CM | POA: Insufficient documentation

## 2018-01-04 DIAGNOSIS — Z79899 Other long term (current) drug therapy: Secondary | ICD-10-CM | POA: Diagnosis not present

## 2018-01-04 DIAGNOSIS — I4892 Unspecified atrial flutter: Secondary | ICD-10-CM | POA: Insufficient documentation

## 2018-01-04 DIAGNOSIS — Z888 Allergy status to other drugs, medicaments and biological substances status: Secondary | ICD-10-CM | POA: Insufficient documentation

## 2018-01-04 DIAGNOSIS — I480 Paroxysmal atrial fibrillation: Secondary | ICD-10-CM | POA: Diagnosis not present

## 2018-01-04 DIAGNOSIS — I10 Essential (primary) hypertension: Secondary | ICD-10-CM | POA: Insufficient documentation

## 2018-01-04 DIAGNOSIS — Z7901 Long term (current) use of anticoagulants: Secondary | ICD-10-CM | POA: Insufficient documentation

## 2018-01-04 NOTE — Progress Notes (Signed)
Primary Care Physician: Smothers, Andree Elk, NP Referring Physician: Dr. Shirlean Schlein Ann Hobbs is a 73 y.o. female with a h/o DM,HTN, typical appearing atrial flutter and atrial fibrillation in the afib clinic for evaluation for increased afib burden. She initially had an episode of typical atrial flutter in 2014 in another hospital.  When she saw Dr. Oval Linsey 3/28, she was in afib, metoprolol was increased and a 24 hour monitor is pending. She has been rx'ed multaq in the past but was too expensive and never started drug. She is in SR today and has noted less afib since increase in metoprolol.Echo in 12/2016 showed normal EF and left atrium was normal in size.No prior stress test, no known CAD.Marland Kitchen  Today, she denies symptoms of palpitations, chest pain, shortness of breath, orthopnea, PND, lower extremity edema, dizziness, presyncope, syncope, or neurologic sequela. The patient is tolerating medications without difficulties and is otherwise without complaint today.   Past Medical History:  Diagnosis Date  . Atrial fibrillation ([place]) 04/25/2016  . Atrial flutter ([place]) 04/25/2016  . Diabetes mellitus without complication ([place])   . Essential hypertension 04/25/2016  . Hyperlipidemia 04/25/2016  . Hypertension    No past surgical history on file.  Current Outpatient Medications  Medication Sig Dispense Refill  . apixaban (ELIQUIS) 5 MG TABS tablet Take 1 tablet (5 mg total) by mouth 2 (two) times daily. 180 tablet 1  . atorvastatin (LIPITOR) 20 MG tablet Take 1 tablet (20 mg total) by mouth daily at 6 PM. 90 tablet 3  . Blood Glucose Monitoring Suppl (GHT BLOOD GLUCOSE MONITOR) w/Device KIT Apply 1 strip topically as directed.    . furosemide (LASIX) 20 MG tablet TAKE ONE (1) TABLET EACH DAY 30 tablet 6  . metoprolol tartrate (LOPRESSOR) 25 MG tablet TAKE 1/2 TABLET BY MOUTH TWICE A DAY 90 tablet 1  . Multiple Vitamins-Minerals (WOMENS MULTIVITAMIN PLUS PO) Take 1 tablet by mouth daily.      . ReliOn Ultra Thin Lancets MISC Apply 1 strip topically as directed.     No current facility-administered medications for this encounter.     Allergies  Allergen Reactions  . Codeine   . Prednisone     Social History   Socioeconomic History  . Marital status: Married    Spouse name: Not on file  . Number of children: Not on file  . Years of education: Not on file  . Highest education level: Not on file  Occupational History  . Not on file  Social Needs  . Financial resource strain: Not on file  . Food insecurity:    Worry: Not on file    Inability: Not on file  . Transportation needs:    Medical: Not on file    Non-medical: Not on file  Tobacco Use  . Smoking status: Never Smoker  . Smokeless tobacco: Never Used  Substance and Sexual Activity  . Alcohol use: Not on file  . Drug use: Not on file  . Sexual activity: Not on file  Lifestyle  . Physical activity:    Days per week: Not on file    Minutes per session: Not on file  . Stress: Not on file  Relationships  . Social connections:    Talks on phone: Not on file    Gets together: Not on file    Attends religious service: Not on file    Active member of club or organization: Not on file    Attends meetings of clubs  or organizations: Not on file    Relationship status: Not on file  . Intimate partner violence:    Fear of current or ex partner: Not on file    Emotionally abused: Not on file    Physically abused: Not on file    Forced sexual activity: Not on file  Other Topics Concern  . Not on file  Social History Narrative  . Not on file    Family History  Problem Relation Age of Onset  . Cancer Father     ROS- All systems are reviewed and negative except as per the HPI above  Physical Exam: Vitals:   01/04/18 0949  BP: (!) 142/84  Pulse: 74  Weight: 164 lb (74.4 kg)  Height: 5' 4.5" (1.638 m)   Wt Readings from Last 3 Encounters:  01/04/18 164 lb (74.4 kg)  12/24/17 163 lb 3.2 oz (74 kg)   03/20/17 162 lb 3.2 oz (73.6 kg)    Labs: Lab Results  Component Value Date   NA 139 09/28/2016   K 4.1 09/28/2016   CL 104 09/28/2016   CO2 28 09/28/2016   GLUCOSE 110 (H) 09/28/2016   BUN 14 09/28/2016   CREATININE 0.67 09/28/2016   CALCIUM 8.8 09/28/2016   No results found for: INR No results found for: CHOL, HDL, LDLCALC, TRIG   GEN- The patient is well appearing, alert and oriented x 3 today.   Head- normocephalic, atraumatic Eyes-  Sclera clear, conjunctiva pink Ears- hearing intact Oropharynx- clear Neck- supple, no JVP Lymph- no cervical lymphadenopathy Lungs- Clear to ausculation bilaterally, normal work of breathing Heart- Regular rate and rhythm, no murmurs, rubs or gallops, PMI not laterally displaced GI- soft, NT, ND, + BS Extremities- no clubbing, cyanosis, or edema MS- no significant deformity or atrophy Skin- no rash or lesion Psych- euthymic mood, full affect Neuro- strength and sensation are intact  EKG- NSR at 74 bpm, PR int 162 ms, qrs int 88 ms, qtc 468 ms Epic records reviewed    Assessment and Plan: 1. Paroxysmal afib/flutter 24 monitor pending General education re afib Discussed antiarrythmic's with pt By guidelines, she will have needed to try/fail  an antiarrythmic first before being a candidate for ablation We discussed flecainide may be a good option but will need to schedule a stress test first Continue metoprolol 25 mg 1/2 tab bid Continue eliquis 5 mg bid for a chadsvasc score of at least 4  After the results of the monitor and stress test are known, will be in contact with pt to discuss start of flecainide.  Geroge Baseman Jasmin Winberry, [facility] [address] [place], [place] [address] [phone]

## 2018-01-06 NOTE — Progress Notes (Signed)
Sounds like a good plan.  Thank you.  ~TCR

## 2018-01-07 ENCOUNTER — Encounter (HOSPITAL_COMMUNITY): Payer: Self-pay | Admitting: *Deleted

## 2018-01-07 ENCOUNTER — Other Ambulatory Visit (HOSPITAL_COMMUNITY): Payer: Self-pay | Admitting: *Deleted

## 2018-01-07 DIAGNOSIS — I480 Paroxysmal atrial fibrillation: Secondary | ICD-10-CM

## 2018-01-07 NOTE — Addendum Note (Signed)
Encounter addended by: Shona Simpsonarter, Abdulloh Ullom S, RN on: 01/07/2018 2:37 PM  Actions taken: Visit diagnoses modified, Order list changed, Diagnosis association updated

## 2018-01-09 ENCOUNTER — Telehealth (HOSPITAL_COMMUNITY): Payer: Self-pay

## 2018-01-09 ENCOUNTER — Ambulatory Visit (INDEPENDENT_AMBULATORY_CARE_PROVIDER_SITE_OTHER): Payer: Medicare Other

## 2018-01-09 DIAGNOSIS — I480 Paroxysmal atrial fibrillation: Secondary | ICD-10-CM

## 2018-01-09 NOTE — Telephone Encounter (Signed)
Patient given detailed instructions per Myocardial Perfusion Study Information Sheet for the test on 01/17/18 at 0945. Patient notified to arrive 15 minutes early and that it is imperative to arrive on time for appointment to keep from having the test rescheduled.  If you need to cancel or reschedule your appointment, please call the office within 24 hours of your appointment. . Patient verbalized understanding. TMY

## 2018-01-16 ENCOUNTER — Encounter (HOSPITAL_COMMUNITY): Payer: Medicare Other

## 2018-01-17 ENCOUNTER — Ambulatory Visit (HOSPITAL_COMMUNITY): Payer: Medicare Other | Attending: Cardiology

## 2018-01-17 DIAGNOSIS — I480 Paroxysmal atrial fibrillation: Secondary | ICD-10-CM

## 2018-01-17 LAB — MYOCARDIAL PERFUSION IMAGING
CHL CUP NUCLEAR SDS: 2
CHL CUP NUCLEAR SRS: 0
CHL CUP RESTING HR STRESS: 66 {beats}/min
CSEPPHR: 85 {beats}/min
LHR: 0.29
LV dias vol: 91 mL (ref 46–106)
LVSYSVOL: 43 mL
SSS: 2
TID: 1

## 2018-01-17 MED ORDER — TECHNETIUM TC 99M TETROFOSMIN IV KIT
30.3000 | PACK | Freq: Once | INTRAVENOUS | Status: AC | PRN
  Administered 2018-01-17: 30.3 via INTRAVENOUS
  Filled 2018-01-17: qty 31

## 2018-01-17 MED ORDER — TECHNETIUM TC 99M TETROFOSMIN IV KIT
10.2000 | PACK | Freq: Once | INTRAVENOUS | Status: AC | PRN
  Administered 2018-01-17: 10.2 via INTRAVENOUS
  Filled 2018-01-17: qty 11

## 2018-01-17 MED ORDER — REGADENOSON 0.4 MG/5ML IV SOLN
0.4000 mg | Freq: Once | INTRAVENOUS | Status: AC
  Administered 2018-01-17: 0.4 mg via INTRAVENOUS

## 2018-01-21 ENCOUNTER — Other Ambulatory Visit (HOSPITAL_COMMUNITY): Payer: Self-pay | Admitting: *Deleted

## 2018-01-21 MED ORDER — FLECAINIDE ACETATE 50 MG PO TABS: 50.0000 mg | ORAL_TABLET | Freq: Two times a day (BID) | ORAL | 3 refills | Status: DC

## 2018-01-25 ENCOUNTER — Emergency Department (HOSPITAL_COMMUNITY): Payer: Medicare Other

## 2018-01-25 ENCOUNTER — Other Ambulatory Visit: Payer: Self-pay

## 2018-01-25 ENCOUNTER — Emergency Department (HOSPITAL_COMMUNITY)
Admission: EM | Admit: 2018-01-25 | Discharge: 2018-01-25 | Disposition: A | Discharge status: Home / Self Care | Payer: Medicare Other | Attending: Emergency Medicine | Admitting: Emergency Medicine

## 2018-01-25 ENCOUNTER — Ambulatory Visit (HOSPITAL_COMMUNITY)
Admission: RE | Admit: 2018-01-25 | Discharge: 2018-01-25 | Disposition: A | Discharge status: Home / Self Care | Payer: Medicare Other | Source: Ambulatory Visit | Attending: Nurse Practitioner | Admitting: Nurse Practitioner

## 2018-01-25 ENCOUNTER — Encounter (HOSPITAL_COMMUNITY): Payer: Self-pay | Admitting: Neurology

## 2018-01-25 DIAGNOSIS — J40 Bronchitis, not specified as acute or chronic: Secondary | ICD-10-CM | POA: Diagnosis not present

## 2018-01-25 DIAGNOSIS — I483 Typical atrial flutter: Secondary | ICD-10-CM

## 2018-01-25 DIAGNOSIS — I4892 Unspecified atrial flutter: Secondary | ICD-10-CM

## 2018-01-25 DIAGNOSIS — I10 Essential (primary) hypertension: Secondary | ICD-10-CM | POA: Insufficient documentation

## 2018-01-25 DIAGNOSIS — I480 Paroxysmal atrial fibrillation: Secondary | ICD-10-CM

## 2018-01-25 DIAGNOSIS — Z7901 Long term (current) use of anticoagulants: Secondary | ICD-10-CM

## 2018-01-25 DIAGNOSIS — I4891 Unspecified atrial fibrillation: Secondary | ICD-10-CM

## 2018-01-25 DIAGNOSIS — E119 Type 2 diabetes mellitus without complications: Secondary | ICD-10-CM | POA: Diagnosis not present

## 2018-01-25 DIAGNOSIS — Z79899 Other long term (current) drug therapy: Secondary | ICD-10-CM | POA: Insufficient documentation

## 2018-01-25 DIAGNOSIS — R000 Tachycardia, unspecified: Secondary | ICD-10-CM | POA: Diagnosis present

## 2018-01-25 LAB — CBC
HEMATOCRIT: 40.9 % (ref 36.0–46.0)
HEMOGLOBIN: 13.6 g/dL (ref 12.0–15.0)
MCH: 31.8 pg (ref 26.0–34.0)
MCHC: 33.3 g/dL (ref 30.0–36.0)
MCV: 95.6 fL (ref 78.0–100.0)
Platelets: 158 10*3/uL (ref 150–400)
RBC: 4.28 MIL/uL (ref 3.87–5.11)
RDW: 14.4 % (ref 11.5–15.5)
WBC: 6.7 10*3/uL (ref 4.0–10.5)

## 2018-01-25 LAB — I-STAT TROPONIN, ED: Troponin i, poc: 0 ng/mL (ref 0.00–0.08)

## 2018-01-25 LAB — BASIC METABOLIC PANEL
Anion gap: 10 (ref 5–15)
BUN: 16 mg/dL (ref 6–20)
CHLORIDE: 102 mmol/L (ref 101–111)
CO2: 25 mmol/L (ref 22–32)
Calcium: 9.3 mg/dL (ref 8.9–10.3)
Creatinine, Ser: 0.8 mg/dL (ref 0.44–1.00)
GFR calc Af Amer: >60 mL/min (ref >60)
GFR calc non Af Amer: >60 mL/min (ref >60)
GLUCOSE: 131 mg/dL — AB (ref 65–99)
Potassium: 4.5 mmol/L (ref 3.5–5.1)
SODIUM: 137 mmol/L (ref 135–145)

## 2018-01-25 MED ORDER — ALBUTEROL SULFATE (2.5 MG/3ML) 0.083% IN NEBU
3.0000 mL | INHALATION_SOLUTION | Freq: Four times a day (QID) | RESPIRATORY_TRACT | Status: DC | PRN
  Filled 2018-01-25: qty 3

## 2018-01-25 MED ORDER — METOPROLOL TARTRATE 25 MG PO TABS: 12.5000 mg | ORAL_TABLET | Freq: Four times a day (QID) | ORAL | 1 refills | Status: DC

## 2018-01-25 MED ORDER — BENZONATATE 100 MG PO CAPS
200.0000 mg | ORAL_CAPSULE | Freq: Once | ORAL | Status: AC
  Administered 2018-01-25: 200 mg via ORAL
  Filled 2018-01-25: qty 2

## 2018-01-25 MED ORDER — BENZONATATE 100 MG PO CAPS: 100.0000 mg | ORAL_CAPSULE | Freq: Three times a day (TID) | ORAL | 0 refills | Status: DC

## 2018-01-25 MED ORDER — METOPROLOL TARTRATE 5 MG/5ML IV SOLN
5.0000 mg | Freq: Once | INTRAVENOUS | Status: AC
  Administered 2018-01-25: 5 mg via INTRAVENOUS
  Filled 2018-01-25: qty 5

## 2018-01-25 MED ORDER — FLECAINIDE ACETATE 50 MG PO TABS: 75.0000 mg | ORAL_TABLET | Freq: Two times a day (BID) | ORAL | 3 refills | Status: DC

## 2018-01-25 MED ORDER — LEVALBUTEROL TARTRATE 45 MCG/ACT IN AERO: 2.0000 | INHALATION_SPRAY | RESPIRATORY_TRACT | 12 refills | Status: DC | PRN

## 2018-01-25 MED ORDER — LEVALBUTEROL TARTRATE 45 MCG/ACT IN AERO: 2.0000 | INHALATION_SPRAY | Freq: Three times a day (TID) | RESPIRATORY_TRACT | Status: DC | PRN

## 2018-01-25 MED ORDER — ALBUTEROL SULFATE (2.5 MG/3ML) 0.083% IN NEBU: 2.5000 mg | INHALATION_SOLUTION | Freq: Three times a day (TID) | RESPIRATORY_TRACT | Status: DC | PRN

## 2018-01-25 MED ORDER — LEVALBUTEROL TARTRATE 45 MCG/ACT IN AERO: 2.0000 | INHALATION_SPRAY | Freq: Four times a day (QID) | RESPIRATORY_TRACT | 12 refills | Status: DC | PRN

## 2018-01-25 MED ORDER — LEVALBUTEROL HCL 1.25 MG/0.5ML IN NEBU
1.2500 mg | INHALATION_SOLUTION | Freq: Once | RESPIRATORY_TRACT | Status: AC
  Administered 2018-01-25: 1.25 mg via RESPIRATORY_TRACT
  Filled 2018-01-25: qty 0.5

## 2018-01-25 NOTE — ED Triage Notes (Addendum)
Pt reports went to AFIB clinic this morning for re-evaluation after being put on flecainide 4 days ago. They did EKG and showed AFIB, RVR. Last week she was dx with bronchitis, and thinking that may have triggered her afib. She was surprised she couldn't tell it, but she has been caring for her husband at home. Pt is a x 4, afib, ekg showing hr 130s. She has history of bouncing in and out of afib. She denies any cp, but does report some sob.

## 2018-01-25 NOTE — Discharge Instructions (Signed)
Hold the lasix for 2 days.  Increase flecainide to 75 mg twice a day (1.5 tabs twice a day)  Increase the metoprolol to half a tab (12.5 mg) every 6 hours until your heart rate slows then go back to half a tab twice a day   Call or return to hospital if you do not improve.    We will have you follow up in a fib clinic in next week.

## 2018-01-25 NOTE — ED Provider Notes (Addendum)
[facility] EMERGENCY DEPARTMENT Provider Note   CSN: [id number] Arrival date & time: 01/25/18  1240     History   Chief Complaint Chief Complaint  Patient presents with  . Atrial Fibrillation    HPI Ann Hobbs is a 72 y.o. female.  Chief complaint is fast heart rate, atrial flutter.  HPI: 72 year old female.  History of paroxysmal atrial flutter and atrial fibrillation.  Followed with Skeet Latch at [facility] cardiology.  Recently on Eliquis, and metoprolol.  Was previously prescribed Multaq, in June 2018. However, did not take it due to cost.  Was seen in the office by Dr. Oval Linsey on 3/18 and found to be in atrial fibrillation at 127 bpm. Had increase in Lopressor.  Went to nuclear medicine testing, and 24-hour Holter.  Seen 3/29 and started on Flecanide '50mg'$  po bid.  Developed some shortness of breath and cough.  Seen at urgent care.  Given beta agonist/albuterol.  Has been using that and cough medicine over the last few days.  Feels as though this may be triggering her A. Fib.  She is subjectively not aware of when she is in A. fib over the last several days.  Presented to A. fib clinic today.  Had hypotension at 90.  Was in a flutter 2-1 rate 130.  Referred here.    Past Medical History:  Diagnosis Date  . Atrial fibrillation ([place]) 04/25/2016  . Atrial flutter ([place]) 04/25/2016  . Diabetes mellitus without complication ([place])   . Essential hypertension 04/25/2016  . Hyperlipidemia 04/25/2016  . Hypertension     Patient Active Problem List   Diagnosis Date Noted  . Atrial fibrillation ([place]) 04/25/2016  . Atrial flutter ([place]) 04/25/2016  . Essential hypertension 04/25/2016  . Hyperlipidemia 04/25/2016    History reviewed. No pertinent surgical history.   OB History   None      Home Medications    Prior to Admission medications   Medication Sig Start Date End Date Taking? Authorizing Provider  albuterol (PROVENTIL HFA;VENTOLIN HFA)  108 (90 Base) MCG/ACT inhaler Inhale 1 puff into the lungs every 6 (six) hours as needed for wheezing or shortness of breath.  01/23/18  Yes [provider]  apixaban (ELIQUIS) 5 MG TABS tablet Take 1 tablet (5 mg total) by mouth 2 (two) times daily. 07/20/17  Yes Skeet Latch, MD  atorvastatin (LIPITOR) 20 MG tablet Take 1 tablet (20 mg total) by mouth daily at 6 PM. 12/24/17  Yes Skeet Latch, MD  benzonatate (TESSALON) 200 MG capsule Take 200 mg by mouth 3 (three) times daily as needed for cough.  01/23/18 02/02/18 Yes [provider]  Calcium Carb-Cholecalciferol (CALCIUM+D3 PO) Take 2 tablets by mouth 2 (two) times daily.   Yes [provider]  flecainide (TAMBOCOR) 50 MG tablet Take 1 tablet (50 mg total) by mouth 2 (two) times daily. 01/21/18  Yes Sherran Needs, NP  loratadine (CLARITIN) 10 MG tablet Take 10 mg by mouth daily.  01/23/18 02/02/18 Yes [provider]  metoprolol tartrate (LOPRESSOR) 25 MG tablet TAKE 1/2 TABLET BY MOUTH TWICE A DAY 12/24/17  Yes Skeet Latch, MD  Multiple Vitamins-Minerals (WOMENS MULTIVITAMIN PLUS PO) Take 1 tablet by mouth daily.   Yes [provider]  Blood Glucose Monitoring Suppl (GHT BLOOD GLUCOSE MONITOR) w/Device KIT Apply 1 strip topically as directed. 02/17/15   [provider]  furosemide (LASIX) 20 MG tablet TAKE ONE (1) TABLET EACH DAY Patient not taking: Reported on 01/25/2018  01/08/17   Skeet Latch, MD  ReliOn Ultra Thin Lancets MISC Apply 1 strip topically as directed. 02/17/15   [provider]    Family History Family History  Problem Relation Age of Onset  . Cancer Father     Social History Social History   Tobacco Use  . Smoking status: Never Smoker  . Smokeless tobacco: Never Used  Substance Use Topics  . Alcohol use: Yes  . Drug use: Not on file     Allergies   Codeine and Prednisone   Review of Systems Review of Systems  Constitutional:  Negative for appetite change, chills, diaphoresis, fatigue and fever.  HENT: Negative for mouth sores, sore throat and trouble swallowing.   Eyes: Negative for visual disturbance.  Respiratory: Positive for shortness of breath. Negative for cough, chest tightness and wheezing.   Cardiovascular: Negative for chest pain.  Gastrointestinal: Negative for abdominal distention, abdominal pain, diarrhea, nausea and vomiting.  Endocrine: Negative for polydipsia, polyphagia and polyuria.  Genitourinary: Negative for dysuria, frequency and hematuria.  Musculoskeletal: Negative for gait problem.  Skin: Negative for color change, pallor and rash.  Neurological: Positive for weakness. Negative for dizziness, syncope, light-headedness and headaches.  Hematological: Does not bruise/bleed easily.  Psychiatric/Behavioral: Negative for behavioral problems and confusion.     Physical Exam Updated Vital Signs BP 128/85   Pulse (!) 123   Temp 98.1 F (36.7 C) (Oral)   Resp 15   Ht 5' 4.5" (1.638 m)   Wt 74.4 kg (164 lb)   SpO2 99%   BMI 27.72 kg/m   Physical Exam  Constitutional: She is oriented to person, place, and time. She appears well-developed and well-nourished. No distress.  HENT:  Head: Normocephalic.  Eyes: Pupils are equal, round, and reactive to light. Conjunctivae are normal. No scleral icterus.  Neck: Normal range of motion. Neck supple. No thyromegaly present.  Cardiovascular: Exam reveals no gallop and no friction rub.  No murmur heard. Irregularly irregular.  2-1 flutter on the monitor.  Rate 137.  Pulmonary/Chest: Effort normal. No respiratory distress. She has no wheezes. She has no rales.  Wheezing prolongation in all field.  Bronchial spastic cough  Abdominal: Soft. Bowel sounds are normal. She exhibits no distension. There is no tenderness. There is no rebound.  Musculoskeletal: Normal range of motion.  Neurological: She is alert and oriented to person, place, and time.    Skin: Skin is warm and dry. No rash noted.  Psychiatric: She has a normal mood and affect. Her behavior is normal.     ED Treatments / Results  Labs (all labs ordered are listed, but only abnormal results are displayed) Labs Reviewed  BASIC METABOLIC PANEL - Abnormal; Notable for the following components:      Result Value   Glucose, Bld 131 (*)    All other components within normal limits  CBC  I-STAT TROPONIN, ED    EKG EKG Interpretation  Date/Time:  Friday January 25 2018 12:46:39 EDT Ventricular Rate:  127 PR Interval:    QRS Duration: 82 QT Interval:  216 QTC Calculation: 313 R Axis:   83 Text Interpretation:  Atrial flutter with 2:1 A-V conduction Nonspecific ST and T wave abnormality Abnormal ECG Confirmed by Tanna Furry [phone]) on 01/25/2018 2:49:23 PM   EKG Interpretation  Date/Time:  Friday January 25 2018 12:46:39 EDT Ventricular Rate:  127 PR Interval:    QRS Duration: 82 QT Interval:  216 QTC Calculation: 313 R Axis:   83 Text  Interpretation:  Atrial flutter with 2:1 A-V conduction Nonspecific ST and T wave abnormality Abnormal ECG Confirmed by Tanna Furry [phone]) on 01/25/2018 2:49:23 PM   Radiology Dg Chest 2 View  Result Date: 01/25/2018 CLINICAL DATA:  Persistent cough and shortness of breath, question fever yesterday, chest tightness since Monday, atrial fibrillation/flutter, hypertension, diabetes mellitus EXAM: CHEST - 2 VIEW COMPARISON:  12/04/2015 FINDINGS: Upper normal heart size. Mediastinal contours and pulmonary vascularity normal. Mild peribronchial thickening without infiltrate, pleural effusion or pneumothorax. Bones demineralized. IMPRESSION: Mild bronchitic changes without infiltrate. Electronically Signed   By: Lavonia Dana M.D.   On: 01/25/2018 13:54    Procedures Procedures (including critical care time)  Medications Ordered in ED Medications - No data to display   Initial Impression / Assessment and Plan / ED Course  I have  reviewed the triage vital signs and the nursing notes.  Pertinent labs & imaging results that were available during my care of the patient were reviewed by me and considered in my medical decision making (see chart for details).   Complex situation.  On beta-blocker for rate control.  No wheezing after this dosage was increased.  Given beta-blocker.  Now has rapid rate.  Also on antiarrhythmic with flecainide.  Stable here despite the rapid rate.  I discussed the case with Dr. Percival Spanish.  Cardiology will see the patient in consult.  CRITICAL CARE Performed by: Lolita Patella   Total critical care time: 30 minutes  Critical care time was exclusive of separately billable procedures and treating other patients.  Critical care was necessary to treat or prevent imminent or life-threatening deterioration.  Critical care was time spent personally by me on the following activities: development of treatment plan with patient and/or surrogate as well as nursing, discussions with consultants, evaluation of patient's response to treatment, examination of patient, obtaining history from patient or surrogate, ordering and performing treatments and interventions, ordering and review of laboratory studies, ordering and review of radiographic studies, pulse oximetry and re-evaluation of patient's condition.   Final Clinical Impressions(s) / ED Diagnoses   Final diagnoses:  Atrial flutter with rapid ventricular response [facility])    ED Discharge Orders    None       Tanna Furry, MD 01/25/18 1541    Tanna Furry, MD 02/10/18 [phone]

## 2018-01-25 NOTE — Consult Note (Addendum)
Cardiology Consultation:   Patient ID: Ann Hobbs; [id number]; 01-24-1946   Admit date: 01/25/2018 Date of Consult: 01/25/2018  Primary Care Provider: Theodis Aguas Andree Elk, NP Primary Cardiologist: Skeet Latch, MD  Primary Electrophysiologist:  A fib clinic Roderic Palau, NP   Patient Profile:   Ann Hobbs is a 72 y.o. female with a hx of PAF/Flutter, diabetes, HTN, HLD who is being seen today for the evaluation of atrial flutter at the request of Dr. Jeneen Rinks.  History of Present Illness:   Ms. Doubek has a hx of PAF/Flutter, diabetes, HTN, HLD and on echo 2016 EF 60-65%.  In 03/2017 pt started on dronedarone but never started due to cost.  She was seen by Dr. Oval Linsey 12/24/17  With more freq episodes of PAF on BB.  On OV she was in a fib.  BB was adjusted.  She has been on Eliquis and has not missed any doses, CHA2DS2VASc of 3.   She was referred to atrial fib clinic.  Before beginning flecainide a stress test was ordered and was low risk for ischemia.  Flecainide was started 50 mg BID on the 11th. EF was 53%.   Today 01/25/18 she presented to a fib clinic for follow up EKG and found a flutter at 127 BPM, BP was low at 80/60, she was clammy so sent to ER.  Please note she was seen on 01/23/18 with acute bronchitis at urgent care.  Albuterol inhaler was prescribed, Zithromax, tessalon and claritin.     EKG a flutter rate of 127 + ST depression may be due to flutter. I personally reviewed.  Na 137, K+ 4.5, Cr 0.80 Hgb 13.6 WBC 6.7 Troponin 0.00 2V CXR Mild bronchitic changes without infiltrate.  BP here 128/85   Currently feels great except for cough.  Is not on ABX.  BP is 462 systolic now and a flutter continues at 124.  No chest pain.  Wants to go home to care for her husband.  Last meal was BK at 1000 AM   Past Medical History:  Diagnosis Date  . Atrial fibrillation ([place]) 04/25/2016  . Atrial flutter ([place]) 04/25/2016  . Diabetes mellitus without complication ([place])    . Essential hypertension 04/25/2016  . Hyperlipidemia 04/25/2016  . Hypertension     History reviewed. No pertinent surgical history.   Home Medications:  Prior to Admission medications   Medication Sig Start Date End Date Taking? Authorizing Provider  albuterol (PROVENTIL HFA;VENTOLIN HFA) 108 (90 Base) MCG/ACT inhaler Inhale 1 puff into the lungs every 6 (six) hours as needed for wheezing or shortness of breath.  01/23/18  Yes [provider]  apixaban (ELIQUIS) 5 MG TABS tablet Take 1 tablet (5 mg total) by mouth 2 (two) times daily. 07/20/17  Yes Skeet Latch, MD  atorvastatin (LIPITOR) 20 MG tablet Take 1 tablet (20 mg total) by mouth daily at 6 PM. 12/24/17  Yes Skeet Latch, MD  benzonatate (TESSALON) 200 MG capsule Take 200 mg by mouth 3 (three) times daily as needed for cough.  01/23/18 02/02/18 Yes [provider]  Calcium Carb-Cholecalciferol (CALCIUM+D3 PO) Take 2 tablets by mouth 2 (two) times daily.   Yes [provider]  flecainide (TAMBOCOR) 50 MG tablet Take 1 tablet (50 mg total) by mouth 2 (two) times daily. 01/21/18  Yes Sherran Needs, NP  loratadine (CLARITIN) 10 MG tablet Take 10 mg by mouth daily.  01/23/18 02/02/18 Yes [provider]  metoprolol tartrate (LOPRESSOR) 25 MG tablet TAKE 1/2 TABLET  BY MOUTH TWICE A DAY 12/24/17  Yes Skeet Latch, MD  Multiple Vitamins-Minerals (WOMENS MULTIVITAMIN PLUS PO) Take 1 tablet by mouth daily.   Yes [provider]  Blood Glucose Monitoring Suppl (GHT BLOOD GLUCOSE MONITOR) w/Device KIT Apply 1 strip topically as directed. 02/17/15   [provider]  furosemide (LASIX) 20 MG tablet TAKE ONE (1) TABLET EACH DAY Patient not taking: Reported on 01/25/2018 01/08/17   Skeet Latch, MD  ReliOn Ultra Thin Lancets MISC Apply 1 strip topically as directed. 02/17/15   [provider]    Inpatient Medications: Scheduled Meds:  Continuous Infusions:  PRN  Meds:   Allergies:    Allergies  Allergen Reactions  . Codeine Nausea And Vomiting  . Prednisone Other (See Comments)    Made the patient overly jittery    Social History:   Social History   Socioeconomic History  . Marital status: Married    Spouse name: Not on file  . Number of children: Not on file  . Years of education: Not on file  . Highest education level: Not on file  Occupational History  . Not on file  Social Needs  . Financial resource strain: Not on file  . Food insecurity:    Worry: Not on file    Inability: Not on file  . Transportation needs:    Medical: Not on file    Non-medical: Not on file  Tobacco Use  . Smoking status: Never Smoker  . Smokeless tobacco: Never Used  Substance and Sexual Activity  . Alcohol use: Yes  . Drug use: Not on file  . Sexual activity: Not on file  Lifestyle  . Physical activity:    Days per week: Not on file    Minutes per session: Not on file  . Stress: Not on file  Relationships  . Social connections:    Talks on phone: Not on file    Gets together: Not on file    Attends religious service: Not on file    Active member of club or organization: Not on file    Attends meetings of clubs or organizations: Not on file    Relationship status: Not on file  . Intimate partner violence:    Fear of current or ex partner: Not on file    Emotionally abused: Not on file    Physically abused: Not on file    Forced sexual activity: Not on file  Other Topics Concern  . Not on file  Social History Narrative  . Not on file    Family History:    Family History  Problem Relation Age of Onset  . Cancer Father      ROS:  Please see the history of present illness.  General:no colds or fevers, no weight changes Skin:no rashes or ulcers HEENT:no blurred vision, no congestion CV:see HPI PUL:see HPI GI:no diarrhea constipation or melena, no indigestion GU:no hematuria, no dysuria MS:no joint pain, no  claudication Neuro:no syncope, no lightheadedness Endo:+diabetes, no thyroid disease  All other ROS reviewed and negative.     Physical Exam/Data:   Vitals:   01/25/18 1248 01/25/18 1411 01/25/18 1500  BP: 100/61 96/72 128/85  Pulse: (!) 128 (!) 127 (!) 123  Resp: '18 18 15  '$ Temp: 98.1 F (36.7 C)    TempSrc: Oral    SpO2: 100% 97% 99%  Weight: 164 lb (74.4 kg)    Height: 5' 4.5" (1.638 m)     No intake or output data  in the 24 hours ending 01/25/18 1608 Filed Weights   01/25/18 1248  Weight: 164 lb (74.4 kg)   Body mass index is 27.72 kg/m.  General:  Well nourished, well developed, in no acute distress, does have occ congested cough  HEENT: normal Lymph: no adenopathy Neck: no JVD Endocrine:  No thryomegaly Vascular: No carotid bruits; pedal pulses 2+ bilaterally   Cardiac:  Rapid HR RRR; no murmur gallup rub or click Lungs:  clear to auscultation bilaterally, though occ wheezing in bases, no rhonchi or rales  Abd: soft, nontender, no hepatomegaly  Ext: no to tr edema Musculoskeletal:  No deformities, BUE and BLE strength normal and equal Skin: warm and dry  Neuro:  Alert and oriented X 3 MAE, follows commands, no focal abnormalities noted Psych:  Normal affect   Relevant CV Studies: Echo 05/24/16  Study Conclusions  - Left ventricle: The cavity size was normal. Systolic function was   normal. The estimated ejection fraction was in the range of 60%   to 65%. Wall motion was normal; there were no regional wall   motion abnormalities. Left ventricular diastolic function   parameters were normal. - Aortic valve: Trileaflet; normal thickness leaflets. There was   mild regurgitation. - Aortic root: The aortic root was normal in size. - Ascending aorta: The ascending aorta was normal in size. - Mitral valve: Structurally normal valve. There was no   regurgitation. - Left atrium: The atrium was normal in size. - Right ventricle: The cavity size was normal. Wall  thickness was   normal. Systolic function was normal. - Tricuspid valve: There was trivial regurgitation. - Pulmonary arteries: Systolic pressure was within the normal   range. - Inferior vena cava: The vessel was normal in size. - Pericardium, extracardiac: There was no pericardial effusion.  Nuc study 01/17/18 Study Highlights     Nuclear stress EF: 53%.  There was no ST segment deviation noted during stress.  Defect 1: There is a medium defect of moderate severity present in the basal anterior and mid anterior location.  This is a low risk study.  The study is normal.  The left ventricular ejection fraction is mildly decreased (45-54%).   Normal stress nuclear study with probable breast attenuation but no ischemia; EF low normal at 53 with normal wall motion.     Laboratory Data:  Chemistry Recent Labs  Lab 01/25/18 1301  NA 137  K 4.5  CL 102  CO2 25  GLUCOSE 131*  BUN 16  CREATININE 0.80  CALCIUM 9.3  GFRNONAA >60  GFRAA >60  ANIONGAP 10    No results for input(s): PROT, ALBUMIN, AST, ALT, ALKPHOS, BILITOT in the last 168 hours. Hematology Recent Labs  Lab 01/25/18 1301  WBC 6.7  RBC 4.28  HGB 13.6  HCT 40.9  MCV 95.6  MCH 31.8  MCHC 33.3  RDW 14.4  PLT 158   Cardiac EnzymesNo results for input(s): TROPONINI in the last 168 hours.  Recent Labs  Lab 01/25/18 1316  TROPIPOC 0.00    BNPNo results for input(s): BNP, PROBNP in the last 168 hours.  DDimer No results for input(s): DDIMER in the last 168 hours.  Radiology/Studies:  Dg Chest 2 View  Result Date: 01/25/2018 CLINICAL DATA:  Persistent cough and shortness of breath, question fever yesterday, chest tightness since Monday, atrial fibrillation/flutter, hypertension, diabetes mellitus EXAM: CHEST - 2 VIEW COMPARISON:  12/04/2015 FINDINGS: Upper normal heart size. Mediastinal contours and pulmonary vascularity normal. Mild peribronchial thickening without  infiltrate, pleural effusion or  pneumothorax. Bones demineralized. IMPRESSION: Mild bronchitic changes without infiltrate. Electronically Signed   By: Lavonia Dana M.D.   On: 01/25/2018 13:54    Assessment and Plan:   1. A flutter with RVR and initially hypotensive at 80 systolic. She is on flecanide and lopressor 12.5 BID  Dr. Percival Spanish to see. Will increase flecainide to 75 mg BID and BB to 12.5 every 6 hours once HR slow she will decrease BB to 12.5 BID.  I have sent message to a fib clinic to have her seen the week of 22nd of April.  2. Hypotension now improved-- will hold lasix for 2 days. 3. Anticoagulation with Eliquis has not missed any doses. 4. Bronchitis treated with albuterol inhaler, tessalon and claritin no ABX.  Pt cannot take prednisone.  CXR without HF or PNA.   Will change the albuterol to xopenex with inhaler in ER.   5. HLD continue statin   For questions or updates, please contact [place] Please consult [email or web address] for contact info under Cardiology/STEMI.   Signed, Cecilie Kicks, NP  01/25/2018 4:08 PM   History and all data above reviewed.  Patient examined.  I agree with the findings as above. The patient denies chest pain.  She has paroxysmal fib and flutter and when she was in the A Fib clinic she was noted to be in atrial flutter with 2:1 conduction.  She was hypotensive.  She has responded well in the ED to fluids.  Rate remains 2:1 flutter.  She cannot tell that she is not in sinus.  She was in NSR late last month on EKG and she had a normal heart rate this week in clinic. She does not have currently any presyncope or syncope.  She has cough and some dyspnea but not PND or orthopnea.  She has had albuterol for bronchitis recently.  The patient exam reveals COR:RRR, no rub  ,  Lungs: Decreased breath sounds with wheezing  ,  Abd: Positive bowel sounds, no rebound no guarding, Ext No edema  .  All available labs, radiology testing, previous records reviewed. Agree with documented assessment and  plan. Atrial flutter:  She would very much like to go home and I think that this is reasonable. She agrees to return if she has continued rapid rate this time tomorrow.  She can increase her flecainide to 75 mg bid and she will take her metoprolol to 12.5 mg qid.  She has been on DOAC uninterrupted.  ED will treat her bronchitis and I have suggested Xopenex rather than albuterol.    Jeneen Rinks Ann Hobbs  5:49 PM  01/25/2018

## 2018-01-25 NOTE — ED Notes (Signed)
Patient given discharge instructions and verbalized understanding.  Patient stable to discharge at this time.  Patient is alert and oriented to baseline.  No distressed noted at this time.  All belongings taken with the patient at discharge.   

## 2018-01-25 NOTE — Progress Notes (Addendum)
Pt in for repeat EKG; to be reviewed by Rudi Cocoonna Akiva Josey, NP  Pt in for f/u EKG after staring flecainde 50 mg bid, this past Wednesday, for paroxysmal afib, after recent stress test showed low risk. EKG shows EKG with atrial flutter at 127 bpm, BP 80/60, midly clammy. States that she saw PCP for URI on Wednesday and was in SR then. Was started on inhaler, Tessalon pearls and antibiotic, but pt did not start antibiotic yet. She is coughing, feels drowsy. Drove here but was very sleepy on way over.. To ER for  further eval of URI and possible cardioversion. Is on eliquis 5 mg bid, has not missed doses. She will call son to drive her home. Husband with recent knee replacement and has been caring for him as well while sick with URI.

## 2018-01-27 ENCOUNTER — Encounter (HOSPITAL_BASED_OUTPATIENT_CLINIC_OR_DEPARTMENT_OTHER): Payer: Self-pay | Admitting: Emergency Medicine

## 2018-01-27 ENCOUNTER — Other Ambulatory Visit: Payer: Self-pay

## 2018-01-27 ENCOUNTER — Emergency Department (HOSPITAL_BASED_OUTPATIENT_CLINIC_OR_DEPARTMENT_OTHER): Payer: Medicare Other

## 2018-01-27 ENCOUNTER — Emergency Department (HOSPITAL_BASED_OUTPATIENT_CLINIC_OR_DEPARTMENT_OTHER)
Admission: EM | Admit: 2018-01-27 | Discharge: 2018-01-27 | Disposition: A | Discharge status: Home / Self Care | Payer: Medicare Other | Attending: Emergency Medicine | Admitting: Emergency Medicine

## 2018-01-27 DIAGNOSIS — E119 Type 2 diabetes mellitus without complications: Secondary | ICD-10-CM | POA: Insufficient documentation

## 2018-01-27 DIAGNOSIS — Z79899 Other long term (current) drug therapy: Secondary | ICD-10-CM | POA: Diagnosis not present

## 2018-01-27 DIAGNOSIS — I480 Paroxysmal atrial fibrillation: Secondary | ICD-10-CM | POA: Insufficient documentation

## 2018-01-27 DIAGNOSIS — I10 Essential (primary) hypertension: Secondary | ICD-10-CM | POA: Insufficient documentation

## 2018-01-27 DIAGNOSIS — Z7901 Long term (current) use of anticoagulants: Secondary | ICD-10-CM | POA: Insufficient documentation

## 2018-01-27 DIAGNOSIS — I4892 Unspecified atrial flutter: Secondary | ICD-10-CM

## 2018-01-27 DIAGNOSIS — R0602 Shortness of breath: Secondary | ICD-10-CM | POA: Diagnosis present

## 2018-01-27 LAB — CBC WITH DIFFERENTIAL/PLATELET
Basophils Absolute: 0 10*3/uL (ref 0.0–0.1)
Basophils Relative: 0 %
EOS PCT: 2 %
Eosinophils Absolute: 0.1 10*3/uL (ref 0.0–0.7)
HCT: 39.5 % (ref 36.0–46.0)
Hemoglobin: 13.7 g/dL (ref 12.0–15.0)
LYMPHS ABS: 3.4 10*3/uL (ref 0.7–4.0)
Lymphocytes Relative: 48 %
MCH: 32.5 pg (ref 26.0–34.0)
MCHC: 34.7 g/dL (ref 30.0–36.0)
MCV: 93.8 fL (ref 78.0–100.0)
Monocytes Absolute: 0.3 10*3/uL (ref 0.1–1.0)
Monocytes Relative: 5 %
Neutro Abs: 3.1 10*3/uL (ref 1.7–7.7)
Neutrophils Relative %: 45 %
PLATELETS: 160 10*3/uL (ref 150–400)
RBC: 4.21 MIL/uL (ref 3.87–5.11)
RDW: 13.8 % (ref 11.5–15.5)
WBC: 6.9 10*3/uL (ref 4.0–10.5)

## 2018-01-27 LAB — BASIC METABOLIC PANEL
ANION GAP: 10 (ref 5–15)
BUN: 23 mg/dL — AB (ref 6–20)
CALCIUM: 9.3 mg/dL (ref 8.9–10.3)
CO2: 24 mmol/L (ref 22–32)
CREATININE: 0.92 mg/dL (ref 0.44–1.00)
Chloride: 105 mmol/L (ref 101–111)
GFR calc Af Amer: >60 mL/min (ref >60)
GLUCOSE: 110 mg/dL — AB (ref 65–99)
Potassium: 4 mmol/L (ref 3.5–5.1)
Sodium: 139 mmol/L (ref 135–145)

## 2018-01-27 LAB — MAGNESIUM: Magnesium: 2.2 mg/dL (ref 1.7–2.4)

## 2018-01-27 LAB — TROPONIN I: Troponin I: <0.03 ng/mL (ref <0.03)

## 2018-01-27 LAB — BRAIN NATRIURETIC PEPTIDE: B Natriuretic Peptide: 382.9 pg/mL — ABNORMAL HIGH (ref 0.0–100.0)

## 2018-01-27 MED ORDER — ONDANSETRON HCL 4 MG/2ML IJ SOLN
4.0000 mg | Freq: Once | INTRAMUSCULAR | Status: AC
  Administered 2018-01-27: 4 mg via INTRAVENOUS
  Filled 2018-01-27: qty 2

## 2018-01-27 MED ORDER — ONDANSETRON HCL 4 MG/2ML IJ SOLN
INTRAMUSCULAR | Status: AC | PRN
  Administered 2018-01-27: 4 mg via INTRAVENOUS

## 2018-01-27 MED ORDER — DILTIAZEM HCL 100 MG IV SOLR
5.0000 mg/h | INTRAVENOUS | Status: DC
  Administered 2018-01-27: 5 mg/h via INTRAVENOUS
  Filled 2018-01-27: qty 100

## 2018-01-27 MED ORDER — SODIUM CHLORIDE 0.9 % IV BOLUS
500.0000 mL | Freq: Once | INTRAVENOUS | Status: AC
  Administered 2018-01-27: 500 mL via INTRAVENOUS

## 2018-01-27 MED ORDER — ETOMIDATE 2 MG/ML IV SOLN
10.0000 mg | Freq: Once | INTRAVENOUS | Status: AC
  Administered 2018-01-27: 10 mg via INTRAVENOUS
  Filled 2018-01-27: qty 10

## 2018-01-27 MED ORDER — ONDANSETRON HCL 4 MG/2ML IJ SOLN
INTRAMUSCULAR | Status: AC
  Filled 2018-01-27: qty 2

## 2018-01-27 MED ORDER — DILTIAZEM LOAD VIA INFUSION
10.0000 mg | Freq: Once | INTRAVENOUS | Status: AC
  Administered 2018-01-27: 10 mg via INTRAVENOUS
  Filled 2018-01-27: qty 10

## 2018-01-27 NOTE — Sedation Documentation (Signed)
Pt denies pain.

## 2018-01-27 NOTE — ED Notes (Signed)
Pt vomited small amt yellow emesis

## 2018-01-27 NOTE — ED Notes (Signed)
Patient transported to X-ray 

## 2018-01-27 NOTE — ED Notes (Signed)
Placed pt on O2 2L [place]

## 2018-01-27 NOTE — Sedation Documentation (Signed)
Pt is gagging and dry heaving. RT at bedside with suction.

## 2018-01-27 NOTE — ED Provider Notes (Signed)
Ann Hobbs   CSN: [id number] Arrival date & time: 01/27/18  1428     History   Chief Complaint Chief Complaint  Patient presents with  . Shortness of Breath    HPI Ann Hobbs is a 72 y.o. female.  72 yo F with a chief complaint of weakness shortness of breath and palpitations.  This been going on for the past 4 or 5 days.  The patient started with URI like symptoms and felt that this had worsened throughout the week.  She was seen on Friday had [facility] and was noted to be in flutter with a rapid rate.  She was sent to the ED because she was hypertensive.  At that time she was given IV fluids with improvement of her blood pressure.  She was then seen by the cardiologist and sent home with an increase of her rate controlling medication as well as an increase of flecainide.  Since then the patient has continued to feel short of breath and have continued palpitations.  She is back today for evaluation.  She has continued to take Eliquis and has not missed any doses.  Denies any worsening lower extremity edema even though she stopped Lasix as instructed a few days ago.  Has continued to have a significant cough.  She denies fevers or chills though had one night where she woke up sweaty.  Denies vomiting or diarrhea.  The history is provided by the patient.  Shortness of Breath  This is a new problem. The average episode lasts 4 days. The problem occurs continuously.The current episode started more than 2 days ago. The problem has been gradually worsening. Pertinent negatives include no fever, no headaches, no rhinorrhea, no wheezing, no chest pain and no vomiting. She has tried beta-agonist inhalers for the symptoms. The treatment provided mild relief. Associated medical issues do not include heart failure.    Past Medical History:  Diagnosis Date  . Atrial fibrillation ([place]) 04/25/2016  . Atrial flutter ([place]) 04/25/2016  . Diabetes  mellitus without complication ([place])   . Essential hypertension 04/25/2016  . Hyperlipidemia 04/25/2016  . Hypertension     Patient Active Problem List   Diagnosis Date Noted  . Atrial fibrillation ([place]) 04/25/2016  . Atrial flutter ([place]) 04/25/2016  . Essential hypertension 04/25/2016  . Hyperlipidemia 04/25/2016    History reviewed. No pertinent surgical history.   OB History   None      Home Medications    Prior to Admission medications   Medication Sig Start Date End Date Taking? Authorizing Provider  apixaban (ELIQUIS) 5 MG TABS tablet Take 1 tablet (5 mg total) by mouth 2 (two) times daily. 07/20/17   Skeet Latch, MD  atorvastatin (LIPITOR) 20 MG tablet Take 1 tablet (20 mg total) by mouth daily at 6 PM. 12/24/17   Skeet Latch, MD  benzonatate (TESSALON) 100 MG capsule Take 1 capsule (100 mg total) by mouth every 8 (eight) hours. 01/25/18   Tanna Furry, MD  benzonatate (TESSALON) 200 MG capsule Take 200 mg by mouth 3 (three) times daily as needed for cough.  01/23/18 02/02/18  [provider]  Blood Glucose Monitoring Suppl (GHT BLOOD GLUCOSE MONITOR) w/Device KIT Apply 1 strip topically as directed. 02/17/15   [provider]  Calcium Carb-Cholecalciferol (CALCIUM+D3 PO) Take 2 tablets by mouth 2 (two) times daily.    [provider]  flecainide (TAMBOCOR) 50 MG tablet Take 1.5 tablets (75 mg total)  by mouth 2 (two) times daily. 01/25/18   Isaiah Serge, NP  furosemide (LASIX) 20 MG tablet TAKE ONE (1) TABLET EACH DAY Patient not taking: Reported on 01/25/2018 01/08/17   Skeet Latch, MD  levalbuterol [facility] HFA) 45 MCG/ACT inhaler Inhale 2 puffs into the lungs every 6 (six) hours as needed for wheezing. 01/25/18   Isaiah Serge, NP  levalbuterol [facility] HFA) 45 MCG/ACT inhaler Inhale 2 puffs into the lungs every 4 (four) hours as needed for wheezing. 01/25/18   Tanna Furry, MD  loratadine (CLARITIN) 10 MG tablet Take 10 mg by mouth  daily.  01/23/18 02/02/18  [provider]  metoprolol tartrate (LOPRESSOR) 25 MG tablet Take 0.5 tablets (12.5 mg total) by mouth 4 (four) times daily. TAKE 1/2 TABLET BY MOUTH TWICE A DAY 01/25/18   Isaiah Serge, NP  Multiple Vitamins-Minerals (WOMENS MULTIVITAMIN PLUS PO) Take 1 tablet by mouth daily.    [provider]  ReliOn Ultra Thin Lancets MISC Apply 1 strip topically as directed. 02/17/15   [provider]    Family History Family History  Problem Relation Age of Onset  . Cancer Father     Social History Social History   Tobacco Use  . Smoking status: Never Smoker  . Smokeless tobacco: Never Used  Substance Use Topics  . Alcohol use: Yes  . Drug use: Not on file     Allergies   Codeine and Prednisone   Review of Systems Review of Systems  Constitutional: Negative for chills and fever.  HENT: Negative for congestion and rhinorrhea.   Eyes: Negative for redness and visual disturbance.  Respiratory: Positive for shortness of breath. Negative for wheezing.   Cardiovascular: Negative for chest pain and palpitations.  Gastrointestinal: Negative for nausea and vomiting.  Genitourinary: Negative for dysuria and urgency.  Musculoskeletal: Negative for arthralgias and myalgias.  Skin: Negative for pallor and wound.  Neurological: Negative for dizziness and headaches.     Physical Exam Updated Vital Signs BP (!) 123/48   Pulse 74   Temp 98.1 F (36.7 C)   Resp 20   Ht 5' 4.5" (1.638 m)   Wt 74.4 kg (164 lb)   SpO2 92%   BMI 27.72 kg/m   Physical Exam  Constitutional: She is oriented to person, place, and time. She appears well-developed and well-nourished. No distress.  HENT:  Head: Normocephalic and atraumatic.  Eyes: Pupils are equal, round, and reactive to light. EOM are normal.  Neck: Normal range of motion. Neck supple.  Cardiovascular: Regular rhythm. Tachycardia present. Exam reveals no gallop and no friction rub.  No  murmur heard. Pulmonary/Chest: Effort normal. She has no wheezes. She has no rales.  Abdominal: Soft. She exhibits no distension. There is no tenderness.  Musculoskeletal: She exhibits no edema or tenderness.  Neurological: She is alert and oriented to person, place, and time.  Skin: Skin is warm and dry. She is not diaphoretic.  Psychiatric: She has a normal mood and affect. Her behavior is normal.  Nursing Hobbs and vitals reviewed.    ED Treatments / Results  Labs (all labs ordered are listed, but only abnormal results are displayed) Labs Reviewed  BRAIN NATRIURETIC PEPTIDE - Abnormal; Notable for the following components:      Result Value   B Natriuretic Peptide 382.9 (*)    All other components within normal limits  BASIC METABOLIC PANEL - Abnormal; Notable for the following components:   Glucose, Bld 110 (*)  BUN 23 (*)    All other components within normal limits  MAGNESIUM  CBC WITH DIFFERENTIAL/PLATELET  TROPONIN I    EKG None  Radiology Dg Chest 2 View  Result Date: 01/27/2018 CLINICAL DATA:  Shortness of breath and cough EXAM: CHEST - 2 VIEW COMPARISON:  01/25/2018 FINDINGS: Cardiac shadow is stable. The lungs are well aerated bilaterally. Mild bronchitic changes are again identified without focal infiltrate or effusion. No bony abnormality is noted. IMPRESSION: Stable appearance of the chest without acute abnormality. Electronically Signed   By: Inez Catalina M.D.   On: 01/27/2018 15:32    Procedures .Sedation Date/Time: 01/27/2018 5:24 PM Performed by: Deno Etienne, DO Authorized by: Deno Etienne, DO   Consent:    Consent obtained:  Written   Consent given by:  Patient   Risks discussed:  Allergic reaction, dysrhythmia, prolonged hypoxia resulting in organ damage, prolonged sedation necessitating reversal, respiratory compromise necessitating ventilatory assistance and intubation, vomiting and nausea   Alternatives discussed:  Analgesia without sedation and  anxiolysis Universal protocol:    Immediately prior to procedure a time out was called: yes     Patient identity confirmation method:  Verbally with patient Indications:    Procedure performed:  Cardioversion   Procedure necessitating sedation performed by:  Physician performing sedation   Intended level of sedation:  Moderate (conscious sedation) Pre-sedation assessment:    Time since last food or drink:  5 hours   NPO status caution: urgency dictates proceeding with non-ideal NPO status     ASA classification: class 2 - patient with mild systemic disease     Neck mobility: normal     Mouth opening:  3 or more finger widths   Thyromental distance:  4 finger widths   Mallampati score:  III - soft palate, base of uvula visible   Pre-sedation assessments completed and reviewed: airway patency, cardiovascular function, hydration status, mental status, nausea/vomiting, pain level, respiratory function and temperature   Immediate pre-procedure details:    Reassessment: Patient reassessed immediately prior to procedure     Reviewed: vital signs, relevant labs/tests and NPO status     Verified: bag valve mask available, emergency equipment available, intubation equipment available, IV patency confirmed, oxygen available and suction available   Procedure details (see MAR for exact dosages):    Preoxygenation:  Nasal cannula   Sedation:  Etomidate   Intra-procedure monitoring:  Blood pressure monitoring, cardiac monitor, continuous capnometry, continuous pulse oximetry, frequent LOC assessments and frequent vital sign checks   Intra-procedure events: none     Total Provider sedation time (minutes):  25 Post-procedure details:    Attendance: Constant attendance by certified staff until patient recovered     Recovery: Patient returned to pre-procedure baseline     Post-sedation assessments completed and reviewed: airway patency, cardiovascular function, hydration status, mental status,  nausea/vomiting, pain level, respiratory function and temperature     Patient is stable for discharge or admission: yes     Patient tolerance:  Tolerated well, no immediate complications .Cardioversion Date/Time: 01/27/2018 5:29 PM Performed by: Deno Etienne, DO Authorized by: Deno Etienne, DO   Consent:    Consent obtained:  Verbal   Consent given by:  Patient   Risks discussed:  Cutaneous burn, death and induced arrhythmia   Alternatives discussed:  Rate-control medication, anti-coagulation medication, observation and referral Pre-procedure details:    Cardioversion basis:  Emergent   Rhythm:  Atrial flutter   Electrode placement:  Anterior-posterior Patient sedated: Yes. Refer to sedation  procedure documentation for details of sedation.  Attempt one:    Cardioversion mode:  Synchronous   Waveform:  Biphasic   Shock (Joules):  100   Shock outcome:  Conversion to normal sinus rhythm Post-procedure details:    Patient status:  Awake   Patient tolerance of procedure:  Tolerated well, no immediate complications   (including critical care time)  Medications Ordered in ED Medications  diltiazem (CARDIZEM) 1 mg/mL load via infusion 10 mg (10 mg Intravenous Bolus from Bag 01/27/18 1538)    And  diltiazem (CARDIZEM) 100 mg in dextrose 5 % 100 mL (1 mg/mL) infusion (0 mg/hr Intravenous Stopped 01/27/18 1636)  sodium chloride 0.9 % bolus 500 mL (0 mLs Intravenous Stopped 01/27/18 1631)  etomidate (AMIDATE) injection 10 mg (10 mg Intravenous Given 01/27/18 1639)  ondansetron (ZOFRAN) injection 4 mg (4 mg Intravenous Given 01/27/18 1635)  ondansetron (ZOFRAN) injection (4 mg Intravenous Given 01/27/18 1652)     Initial Impression / Assessment and Plan / ED Course  I have reviewed the triage vital signs and the nursing notes.  Pertinent labs & imaging results that were available during my care of the patient were reviewed by me and considered in my medical decision making (see chart for  details).     72 yo F with a chief complaint of palpitations and weakness.  This been going on for the past week.  She has been seen in the ED for the same a few days ago.  They have changed her medications without any improvement.  She is continued to be on Eliquis without any missed medications.  I will obtain labs discussed with a cardiologist.  I discussed case with Dr. Domenic Polite, cardiology recommended cardioversion in the ED.  Is not tolerated without issue.  Back to her baseline.  Feels much better with a normal sinus rhythm.  Discharge home.\  CRITICAL CARE Performed by: Cecilio Asper   Total critical care time: 35 minutes  Critical care time was exclusive of separately billable procedures and treating other patients.  Critical care was necessary to treat or prevent imminent or life-threatening deterioration.  Critical care was time spent personally by me on the following activities: development of treatment plan with patient and/or surrogate as well as nursing, discussions with consultants, evaluation of patient's response to treatment, examination of patient, obtaining history from patient or surrogate, ordering and performing treatments and interventions, ordering and review of laboratory studies, ordering and review of radiographic studies, pulse oximetry and re-evaluation of patient's condition.   5:30 PM:  I have discussed the diagnosis/risks/treatment options with the patient and family and believe the pt to be eligible for discharge home to follow-up with Cards. We also discussed returning to the ED immediately if new or worsening sx occur. We discussed the sx which are most concerning (e.g., sudden worsening pain, fever, inability to tolerate by mouth) that necessitate immediate return. Medications administered to the patient during their visit and any new prescriptions provided to the patient are listed below.  Medications given during this visit Medications  diltiazem  (CARDIZEM) 1 mg/mL load via infusion 10 mg (10 mg Intravenous Bolus from Bag 01/27/18 1538)    And  diltiazem (CARDIZEM) 100 mg in dextrose 5 % 100 mL (1 mg/mL) infusion (0 mg/hr Intravenous Stopped 01/27/18 1636)  sodium chloride 0.9 % bolus 500 mL (0 mLs Intravenous Stopped 01/27/18 1631)  etomidate (AMIDATE) injection 10 mg (10 mg Intravenous Given 01/27/18 1639)  ondansetron (ZOFRAN) injection 4 mg (4  mg Intravenous Given 01/27/18 1635)  ondansetron (ZOFRAN) injection (4 mg Intravenous Given 01/27/18 1652)    Labs reviewed bnp mildly elevated, cbc, bmp unremarkable Images reviewed cxr without focal infiltrate Old records reviewed ED visit 2 days ago and cards Hobbs, increased metop and flecainide, changed to xopenex, held lasix.   The patient appears reasonably screen and/or stabilized for discharge and I doubt any other medical condition or other [facility] requiring further screening, evaluation, or treatment in the ED at this time prior to discharge.    Final Clinical Impressions(s) / ED Diagnoses   Final diagnoses:  Atrial flutter with rapid ventricular response [facility])    ED Discharge Orders    None       Deno Etienne, DO 01/27/18 1731

## 2018-01-27 NOTE — ED Triage Notes (Signed)
[facility]  - patient has had SOB and coarse cough since thursday - the patient states that she has increased HR and SOB a "little while ago" and she felt more short of breath

## 2018-01-27 NOTE — Sedation Documentation (Signed)
Unable to assess pain due to sedation.  

## 2018-01-27 NOTE — ED Notes (Signed)
Pt talking with visitor at bedside; sts nausea improving.

## 2018-01-27 NOTE — Discharge Instructions (Addendum)
Continue to take your medications as prescribed.  Return for worsening symptoms.

## 2018-01-28 ENCOUNTER — Telehealth: Payer: Self-pay | Admitting: Cardiovascular Disease

## 2018-01-28 NOTE — Telephone Encounter (Signed)
1. A flutter with RVR and initially hypotensive at 80 systolic. She is on flecanide and lopressor 12.5 BID Dr. Antoine PocheHochrein to see. Will increase flecainide to 75 mg BID and BB to 12.5 every 6 hours once HR slow she will decrease BB to 12.5 BID. I have sent message to a fib clinic to have her seen the week of 22nd of April.  2. Hypotension now improved-- will hold lasix for 2 days. 3. Anticoagulation with Eliquis has not missed any doses. 4. Bronchitis treated with albuterol inhaler, tessalon and claritin no ABX. Pt cannot take prednisone. CXR without HF or PNA. Will change the albuterol to xopenex with inhaler in ER.  5. HLD continue statin For questions or updates, please contact [facility]  Please consult [email or web address] for contact info under Cardiology/STEMI.  Signed,  Nada BoozerLaura Ingold, NP  01/25/2018 4:08 PM   Spoke to patient, patient is doing okay this morning, BP 117/70s, HR 73.  She was wondering if she needs to be seen prior to June with Dr. Duke Salviaandolph.  She is concerned about side effects from flecainide.  She was also wondering how she needs to be taking her medications.  Advised to continue to flecainide as instructed at 75 mg BID and metoprolol 12.5 BID.   Advised per note, she is needing f[facility] this [facility].   Advised I would sent note to [facility] RN to assist with scheduling.   Patient aware and verbalized understanding.   She will continue medications and monitor BP and HR

## 2018-01-28 NOTE — Telephone Encounter (Signed)
appt made

## 2018-01-28 NOTE — Telephone Encounter (Signed)
New Message:       Pt states she has been in the ER all weekend and pt states that had to shock her to get her heart back in to rhythm. Pt states they increased her dosage of medication and patient is unsure if she still should be taking those dosages. Pt has appt on 6/18 but feels as if she needs to get in sooner.

## 2018-01-31 ENCOUNTER — Other Ambulatory Visit: Payer: Self-pay

## 2018-01-31 ENCOUNTER — Ambulatory Visit (HOSPITAL_COMMUNITY)
Admission: RE | Admit: 2018-01-31 | Discharge: 2018-01-31 | Disposition: A | Discharge status: Home / Self Care | Payer: Medicare Other | Source: Ambulatory Visit | Attending: Nurse Practitioner | Admitting: Nurse Practitioner

## 2018-01-31 ENCOUNTER — Encounter (HOSPITAL_COMMUNITY): Payer: Self-pay | Admitting: Nurse Practitioner

## 2018-01-31 VITALS — BP 118/76 | HR 119 | Ht 64.5 in | Wt 161.0 lb

## 2018-01-31 DIAGNOSIS — I4892 Unspecified atrial flutter: Secondary | ICD-10-CM | POA: Diagnosis not present

## 2018-01-31 DIAGNOSIS — Z79899 Other long term (current) drug therapy: Secondary | ICD-10-CM | POA: Diagnosis not present

## 2018-01-31 DIAGNOSIS — I10 Essential (primary) hypertension: Secondary | ICD-10-CM | POA: Diagnosis not present

## 2018-01-31 DIAGNOSIS — E119 Type 2 diabetes mellitus without complications: Secondary | ICD-10-CM | POA: Diagnosis not present

## 2018-01-31 DIAGNOSIS — E785 Hyperlipidemia, unspecified: Secondary | ICD-10-CM | POA: Insufficient documentation

## 2018-01-31 DIAGNOSIS — I480 Paroxysmal atrial fibrillation: Secondary | ICD-10-CM | POA: Diagnosis present

## 2018-01-31 DIAGNOSIS — J40 Bronchitis, not specified as acute or chronic: Secondary | ICD-10-CM | POA: Insufficient documentation

## 2018-01-31 NOTE — Patient Instructions (Signed)
Cardioversion scheduled for Tuesday, April 30th  - Ar[facility] at [facility] and go to admitting at 9:30AM  -Do not eat or drink anything after midnight the night prior to your procedure.  - Take all your medication with a sip of water prior to arrival.  - You will not be able to drive home after your procedure.

## 2018-01-31 NOTE — Progress Notes (Signed)
Primary Care Physician: Berkley Harvey, NP Referring Physician: Dr. Shirlean Schlein Schimek is a 72 y.o. female with a h/o DM,HTN, typical appearing atrial flutter and atrial fibrillation in the afib clinic for evaluation for increased afib burden. She initially had an episode of typical atrial flutter in 2014 in another hospital.  When she saw Dr. Oval Linsey 3/28, she was in afib, metoprolol was increased and a 24 hour monitor is pending. She has been rx'ed multaq in the past but was too expensive and never started drug. She is in SR today and has noted less afib since increase in metoprolol.Echo in 12/2016 showed normal EF and left atrium was normal in size.No prior stress test, no known CAD.  F/u in afib clinic, 4/25. She is here for recent cardioversion for persistent flutter.I saw her after low risk stress test and she was started on flecainide 50 mg bid. When she came infor f/u EKG, she was in aflutter with RVR, low BP, clammy, in the setting of acute bronchitis.Has seen PCP the day before and started treatment with inhaler rx for cough and was to pick up antibiotic to start. She was so symptomatic/ unstable, I sent her to ER to get cardioverter. However, general cardiology saw her and  decided to increase flecainide and BB and send home when BP came up and HR slowed. However, the next day, her Afib had rapid rates again and she felt worse. Went to [employer], [employer] and was cardioverted. She returned to afib within 24-48 hours. When this occurred, she went back to 50 mg flecainide and lowered dose BB. She continues with bronchitis but is now improving. She has stopped all meds, including inhaler, as she felt this made her afib worse.   Today, she denies symptoms of palpitations, chest pain,  , orthopnea, PND, lower extremity edema, dizziness, presyncope, syncope, or neurologic sequela.  + for cough, fatigue, shortness of breath with exertion.The patient is tolerating medications without  difficulties and is otherwise without complaint today.   Past Medical History:  Diagnosis Date  . Atrial fibrillation ([place]) 04/25/2016  . Atrial flutter ([place]) 04/25/2016  . Diabetes mellitus without complication ([place])   . Essential hypertension 04/25/2016  . Hyperlipidemia 04/25/2016  . Hypertension    No past surgical history on file.  Current Outpatient Medications  Medication Sig Dispense Refill  . apixaban (ELIQUIS) 5 MG TABS tablet Take 1 tablet (5 mg total) by mouth 2 (two) times daily. 180 tablet 1  . atorvastatin (LIPITOR) 20 MG tablet Take 1 tablet (20 mg total) by mouth daily at 6 PM. 90 tablet 3  . Blood Glucose Monitoring Suppl (GHT BLOOD GLUCOSE MONITOR) w/Device KIT Apply 1 strip topically as directed.    . Calcium Carb-Cholecalciferol (CALCIUM+D3 PO) Take 2 tablets by mouth 2 (two) times daily.    . flecainide (TAMBOCOR) 50 MG tablet Take 50 mg by mouth 2 (two) times daily.    . metoprolol tartrate (LOPRESSOR) 25 MG tablet Take 12.5 mg by mouth 2 (two) times daily.    . Multiple Vitamins-Minerals (WOMENS MULTIVITAMIN PLUS PO) Take 1 tablet by mouth daily.    . ReliOn Ultra Thin Lancets MISC Apply 1 strip topically as directed.     No current facility-administered medications for this encounter.     Allergies  Allergen Reactions  . Codeine Nausea And Vomiting  . Prednisone Other (See Comments)    Made the patient overly jittery    Social History   Socioeconomic  History  . Marital status: Married    Spouse name: Not on file  . Number of children: Not on file  . Years of education: Not on file  . Highest education level: Not on file  Occupational History  . Not on file  Social Needs  . Financial resource strain: Not on file  . Food insecurity:    Worry: Not on file    Inability: Not on file  . Transportation needs:    Medical: Not on file    Non-medical: Not on file  Tobacco Use  . Smoking status: Never Smoker  . Smokeless tobacco: Never Used  Substance  and Sexual Activity  . Alcohol use: Yes  . Drug use: Not on file  . Sexual activity: Not on file  Lifestyle  . Physical activity:    Days per week: Not on file    Minutes per session: Not on file  . Stress: Not on file  Relationships  . Social connections:    Talks on phone: Not on file    Gets together: Not on file    Attends religious service: Not on file    Active member of club or organization: Not on file    Attends meetings of clubs or organizations: Not on file    Relationship status: Not on file  . Intimate partner violence:    Fear of current or ex partner: Not on file    Emotionally abused: Not on file    Physically abused: Not on file    Forced sexual activity: Not on file  Other Topics Concern  . Not on file  Social History Narrative  . Not on file    Family History  Problem Relation Age of Onset  . Cancer Father     ROS- All systems are reviewed and negative except as per the HPI above  Physical Exam: Vitals:   01/31/18 0900  BP: 118/76  Pulse: (!) 119  Weight: 161 lb (73 kg)  Height: 5' 4.5" (1.638 m)   Wt Readings from Last 3 Encounters:  01/31/18 161 lb (73 kg)  01/27/18 164 lb (74.4 kg)  01/25/18 164 lb (74.4 kg)    Labs: Lab Results  Component Value Date   NA 139 01/27/2018   K 4.0 01/27/2018   CL 105 01/27/2018   CO2 24 01/27/2018   GLUCOSE 110 (H) 01/27/2018   BUN 23 (H) 01/27/2018   CREATININE 0.92 01/27/2018   CALCIUM 9.3 01/27/2018   MG 2.2 01/27/2018   No results found for: INR No results found for: CHOL, HDL, LDLCALC, TRIG   GEN- The patient is well appearing, alert and oriented x 3 today.   Head- normocephalic, atraumatic Eyes-  Sclera clear, conjunctiva pink Ears- hearing intact Oropharynx- clear Neck- supple, no JVP Lymph- no cervical lymphadenopathy Lungs- Clear to ausculation bilaterally, normal work of breathing Heart- Rapid, regular rate and rhythm, no murmurs, rubs or gallops, PMI not laterally displaced GI-  soft, NT, ND, + BS Extremities- no clubbing, cyanosis, or edema MS- no significant deformity or atrophy Skin- no rash or lesion Psych- euthymic mood, full affect Neuro- strength and sensation are intact  EKG-  Probable atrial flutter  at 119 bpm, NSIVB Epic records reviewed    Assessment and Plan: 1. Paroxysmal afib/ typical flutter Recent cardioversion on flecainide,  that was successful but ERAF This has been in the setting of URI, which is aggravating afib and not to say that flecainide will not work when she gets  over URI Will try cardioversion again next week In the interim, increase flecainide back to 100 mg bid Increase metoprolol to 25 mg bid By guidelines, she will have needed to try/fail  an antiarrythmic first before being a candidate for ablation, if ERAF after this cardioversion, on flecainide, will refer for ablation Continue eliquis 5 mg bid for a chadsvasc score of at least 4, states no missed doses  2. Bronchitis Has complicated recent treatment of afib with flecainide/cardioversion  Improving She has stopped inhaler/ tessalon perls Suggested Muccinex/robitussin without decongestants  F/u here in one week after cardioversion  Butch Penny C. Neven Fina, [facility] [address] [place], [place] [address] [phone]

## 2018-01-31 NOTE — H&P (View-Only) (Signed)
Primary Care Physician: Berkley Harvey, NP Referring Physician: Dr. Shirlean Schlein Ann Hobbs is a 72 y.o. female with a h/o DM,HTN, typical appearing atrial flutter and atrial fibrillation in the afib clinic for evaluation for increased afib burden. She initially had an episode of typical atrial flutter in 2014 in another hospital.  When she saw Dr. Oval Linsey 3/28, she was in afib, metoprolol was increased and a 24 hour monitor is pending. She has been rx'ed multaq in the past but was too expensive and never started drug. She is in SR today and has noted less afib since increase in metoprolol.Echo in 12/2016 showed normal EF and left atrium was normal in size.No prior stress test, no known CAD.  F/u in afib clinic, 4/25. She is here for recent cardioversion for persistent flutter.I saw her after low risk stress test and she was started on flecainide 50 mg bid. When she came infor f/u EKG, she was in aflutter with RVR, low BP, clammy, in the setting of acute bronchitis.Has seen PCP the day before and started treatment with inhaler rx for cough and was to pick up antibiotic to start. She was so symptomatic/ unstable, I sent her to ER to get cardioverter. However, general cardiology saw her and  decided to increase flecainide and BB and send home when BP came up and HR slowed. However, the next day, her Afib had rapid rates again and she felt worse. Went to [employer], [employer] and was cardioverted. She returned to afib within 24-48 hours. When this occurred, she went back to 50 mg flecainide and lowered dose BB. She continues with bronchitis but is now improving. She has stopped all meds, including inhaler, as she felt this made her afib worse.   Today, she denies symptoms of palpitations, chest pain,  , orthopnea, PND, lower extremity edema, dizziness, presyncope, syncope, or neurologic sequela.  + for cough, fatigue, shortness of breath with exertion.The patient is tolerating medications without  difficulties and is otherwise without complaint today.   Past Medical History:  Diagnosis Date  . Atrial fibrillation ([place]) 04/25/2016  . Atrial flutter ([place]) 04/25/2016  . Diabetes mellitus without complication ([place])   . Essential hypertension 04/25/2016  . Hyperlipidemia 04/25/2016  . Hypertension    No past surgical history on file.  Current Outpatient Medications  Medication Sig Dispense Refill  . apixaban (ELIQUIS) 5 MG TABS tablet Take 1 tablet (5 mg total) by mouth 2 (two) times daily. 180 tablet 1  . atorvastatin (LIPITOR) 20 MG tablet Take 1 tablet (20 mg total) by mouth daily at 6 PM. 90 tablet 3  . Blood Glucose Monitoring Suppl (GHT BLOOD GLUCOSE MONITOR) w/Device KIT Apply 1 strip topically as directed.    . Calcium Carb-Cholecalciferol (CALCIUM+D3 PO) Take 2 tablets by mouth 2 (two) times daily.    . flecainide (TAMBOCOR) 50 MG tablet Take 50 mg by mouth 2 (two) times daily.    . metoprolol tartrate (LOPRESSOR) 25 MG tablet Take 12.5 mg by mouth 2 (two) times daily.    . Multiple Vitamins-Minerals (WOMENS MULTIVITAMIN PLUS PO) Take 1 tablet by mouth daily.    . ReliOn Ultra Thin Lancets MISC Apply 1 strip topically as directed.     No current facility-administered medications for this encounter.     Allergies  Allergen Reactions  . Codeine Nausea And Vomiting  . Prednisone Other (See Comments)    Made the patient overly jittery    Social History   Socioeconomic  History  . Marital status: Married    Spouse name: Not on file  . Number of children: Not on file  . Years of education: Not on file  . Highest education level: Not on file  Occupational History  . Not on file  Social Needs  . Financial resource strain: Not on file  . Food insecurity:    Worry: Not on file    Inability: Not on file  . Transportation needs:    Medical: Not on file    Non-medical: Not on file  Tobacco Use  . Smoking status: Never Smoker  . Smokeless tobacco: Never Used  Substance  and Sexual Activity  . Alcohol use: Yes  . Drug use: Not on file  . Sexual activity: Not on file  Lifestyle  . Physical activity:    Days per week: Not on file    Minutes per session: Not on file  . Stress: Not on file  Relationships  . Social connections:    Talks on phone: Not on file    Gets together: Not on file    Attends religious service: Not on file    Active member of club or organization: Not on file    Attends meetings of clubs or organizations: Not on file    Relationship status: Not on file  . Intimate partner violence:    Fear of current or ex partner: Not on file    Emotionally abused: Not on file    Physically abused: Not on file    Forced sexual activity: Not on file  Other Topics Concern  . Not on file  Social History Narrative  . Not on file    Family History  Problem Relation Age of Onset  . Cancer Father     ROS- All systems are reviewed and negative except as per the HPI above  Physical Exam: Vitals:   01/31/18 0900  BP: 118/76  Pulse: (!) 119  Weight: 161 lb (73 kg)  Height: 5' 4.5" (1.638 m)   Wt Readings from Last 3 Encounters:  01/31/18 161 lb (73 kg)  01/27/18 164 lb (74.4 kg)  01/25/18 164 lb (74.4 kg)    Labs: Lab Results  Component Value Date   NA 139 01/27/2018   K 4.0 01/27/2018   CL 105 01/27/2018   CO2 24 01/27/2018   GLUCOSE 110 (H) 01/27/2018   BUN 23 (H) 01/27/2018   CREATININE 0.92 01/27/2018   CALCIUM 9.3 01/27/2018   MG 2.2 01/27/2018   No results found for: INR No results found for: CHOL, HDL, LDLCALC, TRIG   GEN- The patient is well appearing, alert and oriented x 3 today.   Head- normocephalic, atraumatic Eyes-  Sclera clear, conjunctiva pink Ears- hearing intact Oropharynx- clear Neck- supple, no JVP Lymph- no cervical lymphadenopathy Lungs- Clear to ausculation bilaterally, normal work of breathing Heart- Rapid, regular rate and rhythm, no murmurs, rubs or gallops, PMI not laterally displaced GI-  soft, NT, ND, + BS Extremities- no clubbing, cyanosis, or edema MS- no significant deformity or atrophy Skin- no rash or lesion Psych- euthymic mood, full affect Neuro- strength and sensation are intact  EKG-  Probable atrial flutter  at 119 bpm, NSIVB Epic records reviewed    Assessment and Plan: 1. Paroxysmal afib/ typical flutter Recent cardioversion on flecainide,  that was successful but ERAF This has been in the setting of URI, which is aggravating afib and not to say that flecainide will not work when she gets  over URI Will try cardioversion again next week In the interim, increase flecainide back to 100 mg bid Increase metoprolol to 25 mg bid By guidelines, she will have needed to try/fail  an antiarrythmic first before being a candidate for ablation, if ERAF after this cardioversion, on flecainide, will refer for ablation Continue eliquis 5 mg bid for a chadsvasc score of at least 4, states no missed doses  2. Bronchitis Has complicated recent treatment of afib with flecainide/cardioversion  Improving She has stopped inhaler/ tessalon perls Suggested Muccinex/robitussin without decongestants  F/u here in one week after cardioversion  Butch Penny C. Zymarion Favorite, [facility] [address] [place], [place] [address] [phone]

## 2018-02-01 ENCOUNTER — Other Ambulatory Visit: Payer: Self-pay | Admitting: Cardiovascular Disease

## 2018-02-05 ENCOUNTER — Other Ambulatory Visit: Payer: Self-pay

## 2018-02-05 ENCOUNTER — Ambulatory Visit (HOSPITAL_COMMUNITY): Payer: Medicare Other | Admitting: Anesthesiology

## 2018-02-05 ENCOUNTER — Encounter (HOSPITAL_COMMUNITY): Payer: Self-pay | Admitting: Anesthesiology

## 2018-02-05 ENCOUNTER — Encounter (HOSPITAL_COMMUNITY)
Admission: RE | Disposition: A | Discharge status: Home / Self Care | Payer: Self-pay | Source: Ambulatory Visit | Attending: Cardiovascular Disease

## 2018-02-05 ENCOUNTER — Ambulatory Visit (HOSPITAL_COMMUNITY)
Admission: RE | Admit: 2018-02-05 | Discharge: 2018-02-05 | Disposition: A | Discharge status: Home / Self Care | Payer: Medicare Other | Source: Ambulatory Visit | Attending: Cardiovascular Disease | Admitting: Cardiovascular Disease

## 2018-02-05 DIAGNOSIS — E785 Hyperlipidemia, unspecified: Secondary | ICD-10-CM | POA: Diagnosis not present

## 2018-02-05 DIAGNOSIS — E119 Type 2 diabetes mellitus without complications: Secondary | ICD-10-CM | POA: Diagnosis not present

## 2018-02-05 DIAGNOSIS — Z7901 Long term (current) use of anticoagulants: Secondary | ICD-10-CM | POA: Insufficient documentation

## 2018-02-05 DIAGNOSIS — I483 Typical atrial flutter: Secondary | ICD-10-CM | POA: Insufficient documentation

## 2018-02-05 DIAGNOSIS — I480 Paroxysmal atrial fibrillation: Secondary | ICD-10-CM | POA: Diagnosis present

## 2018-02-05 DIAGNOSIS — J209 Acute bronchitis, unspecified: Secondary | ICD-10-CM | POA: Diagnosis not present

## 2018-02-05 DIAGNOSIS — I10 Essential (primary) hypertension: Secondary | ICD-10-CM | POA: Insufficient documentation

## 2018-02-05 HISTORY — PX: CARDIOVERSION: SHX1299

## 2018-02-05 HISTORY — DX: Other specified postprocedural states: R11.2

## 2018-02-05 HISTORY — DX: Other specified postprocedural states: Z98.890

## 2018-02-05 LAB — GLUCOSE, CAPILLARY: Glucose-Capillary: 81 mg/dL (ref 65–99)

## 2018-02-05 SURGERY — CARDIOVERSION
Anesthesia: General

## 2018-02-05 MED ORDER — DEXAMETHASONE SODIUM PHOSPHATE 4 MG/ML IJ SOLN
INTRAMUSCULAR | Status: DC | PRN
  Administered 2018-02-05: 10 mg via INTRAVENOUS

## 2018-02-05 MED ORDER — SODIUM CHLORIDE 0.9 % IV SOLN
INTRAVENOUS | Status: DC
  Administered 2018-02-05: 500 mL via INTRAVENOUS

## 2018-02-05 MED ORDER — LIDOCAINE HCL (CARDIAC) PF 100 MG/5ML IV SOSY
PREFILLED_SYRINGE | INTRAVENOUS | Status: DC | PRN
  Administered 2018-02-05: 60 mg via INTRAVENOUS

## 2018-02-05 MED ORDER — ONDANSETRON HCL 4 MG/2ML IJ SOLN
INTRAMUSCULAR | Status: DC | PRN
  Administered 2018-02-05: 4 mg via INTRAVENOUS

## 2018-02-05 MED ORDER — PROPOFOL 10 MG/ML IV BOLUS
INTRAVENOUS | Status: DC | PRN
  Administered 2018-02-05: 60 mg via INTRAVENOUS
  Administered 2018-02-05: 20 mg via INTRAVENOUS

## 2018-02-05 NOTE — Interval H&P Note (Signed)
History and Physical Interval Note:  02/05/2018 11:14 AM  Ann Hobbs  has presented today for surgery, with the diagnosis of AFIB  The various methods of treatment have been discussed with the patient and family. After consideration of risks, benefits and other options for treatment, the patient has consented to  Procedure(s): CARDIOVERSION (N/A) as a surgical intervention .  The patient's history has been reviewed, patient examined, no change in status, stable for surgery.  I have reviewed the patient's chart and labs.  Questions were answered to the patient's satisfaction.     Charlton Haws

## 2018-02-05 NOTE — Anesthesia Postprocedure Evaluation (Signed)
Anesthesia Post Note  Patient: Ann Hobbs  Procedure(s) Performed: CARDIOVERSION (N/A )     Patient location during evaluation: PACU Anesthesia Type: General Level of consciousness: awake and alert Pain management: pain level controlled Vital Signs Assessment: post-procedure vital signs reviewed and stable Respiratory status: spontaneous breathing, nonlabored ventilation and respiratory function stable Cardiovascular status: blood pressure returned to baseline and stable Postop Assessment: no apparent nausea or vomiting Anesthetic complications: no    Last Vitals:  Vitals:   02/05/18 1120 02/05/18 1122  BP: (!) 110/51   Pulse: (!) 56 (!) 58  Resp: 15 19  Temp:    SpO2: 100% 98%    Last Pain:  Vitals:   02/05/18 1122  TempSrc:   PainSc: 0-No pain                 Lowella Curb

## 2018-02-05 NOTE — CV Procedure (Signed)
[facility]: Anesthesia: Dr Broadus John Zofran and Proopfol On flecainide and eliquis no missed doses  DCC x 1 150 J Converted from aflutter rate 80 to NSR rate 63 No immediate neurologic sequeleae  Ann Hobbs

## 2018-02-05 NOTE — Anesthesia Preprocedure Evaluation (Signed)
Anesthesia Evaluation  Patient identified by MRN, date of birth, ID band Patient awake    Reviewed: Allergy & Precautions, NPO status , Patient's Chart, lab work & pertinent test results, reviewed documented beta blocker date and time   History of Anesthesia Complications (+) PONV  Airway Mallampati: II  TM Distance: >3 FB Neck ROM: Full    Dental no notable dental hx.    Pulmonary neg pulmonary ROS,    Pulmonary exam normal breath sounds clear to auscultation       Cardiovascular hypertension, Pt. on medications and Pt. on home beta blockers negative cardio ROS Normal cardiovascular exam+ dysrhythmias Atrial Fibrillation  Rhythm:Regular Rate:Normal     Neuro/Psych negative neurological ROS  negative psych ROS   GI/Hepatic negative GI ROS, Neg liver ROS,   Endo/Other  negative endocrine ROSdiabetes  Renal/GU negative Renal ROS  negative genitourinary   Musculoskeletal negative musculoskeletal ROS (+)   Abdominal   Peds negative pediatric ROS (+)  Hematology negative hematology ROS (+)   Anesthesia Other Findings   Reproductive/Obstetrics negative OB ROS                             Anesthesia Physical Anesthesia Plan  ASA: III  Anesthesia Plan: General   Post-op Pain Management:    Induction: Intravenous  PONV Risk Score and Plan: 4 or greater and Treatment may vary due to age or medical condition  Airway Management Planned: Mask  Additional Equipment:   Intra-op Plan:   Post-operative Plan:   Informed Consent: I have reviewed the patients History and Physical, chart, labs and discussed the procedure including the risks, benefits and alternatives for the proposed anesthesia with the patient or authorized representative who has indicated his/her understanding and acceptance.   Dental advisory given  Plan Discussed with: CRNA  Anesthesia Plan Comments:          Anesthesia Quick Evaluation

## 2018-02-05 NOTE — Discharge Instructions (Signed)
Electrical Cardioversion, Care After °This sheet gives you information about how to care for yourself after your procedure. Your health care provider may also give you more specific instructions. If you have problems or questions, contact your health care provider. °What can I expect after the procedure? °After the procedure, it is common to have: °· Some redness on the skin where the shocks were given. ° °Follow these instructions at home: °· Do not drive for 24 hours if you were given a medicine to help you relax (sedative). °· Take over-the-counter and prescription medicines only as told by your health care provider. °· Ask your health care provider how to check your pulse. Check it often. °· Rest for 48 hours after the procedure or as told by your health care provider. °· Avoid or limit your caffeine use as told by your health care provider. °Contact a health care provider if: °· You feel like your heart is beating too quickly or your pulse is not regular. °· You have a serious muscle cramp that does not go away. °Get help right away if: °· You have discomfort in your chest. °· You are dizzy or you feel faint. °· You have trouble breathing or you are short of breath. °· Your speech is slurred. °· You have trouble moving an arm or leg on one side of your body. °· Your fingers or toes turn cold or blue. °This information is not intended to replace advice given to you by your health care provider. Make sure you discuss any questions you have with your health care provider. °Document Released: 07/16/2013 Document Revised: 04/28/2016 Document Reviewed: 03/31/2016 °Elsevier Interactive Patient Education © 2018 [employer] ° °

## 2018-02-05 NOTE — Transfer of Care (Signed)
Immediate Anesthesia Transfer of Care Note  Patient: Ann Hobbs  Procedure(s) Performed: CARDIOVERSION (N/A )  Patient Location: Endoscopy Unit  Anesthesia Type:MAC  Level of Consciousness: awake  Airway & Oxygen Therapy: Patient Spontanous Breathing  Post-op Assessment: Report given to RN and Post -op Vital signs reviewed and stable  Post vital signs: Reviewed and stable  Last Vitals:  Vitals Value Taken Time  BP    Temp    Pulse    Resp    SpO2      Last Pain:  Vitals:   02/05/18 1014  TempSrc: Oral  PainSc: 0-No pain         Complications: No apparent anesthesia complications

## 2018-02-06 ENCOUNTER — Encounter (HOSPITAL_COMMUNITY): Payer: Self-pay | Admitting: Cardiovascular Disease

## 2018-02-12 ENCOUNTER — Ambulatory Visit (HOSPITAL_COMMUNITY)
Admission: RE | Admit: 2018-02-12 | Discharge: 2018-02-12 | Disposition: A | Discharge status: Home / Self Care | Payer: Medicare Other | Source: Ambulatory Visit | Attending: Nurse Practitioner | Admitting: Nurse Practitioner

## 2018-02-12 ENCOUNTER — Encounter (HOSPITAL_COMMUNITY): Payer: Self-pay | Admitting: Nurse Practitioner

## 2018-02-12 VITALS — BP 116/74 | HR 68 | Ht 64.5 in | Wt 162.0 lb

## 2018-02-12 DIAGNOSIS — I483 Typical atrial flutter: Secondary | ICD-10-CM | POA: Diagnosis not present

## 2018-02-12 DIAGNOSIS — J40 Bronchitis, not specified as acute or chronic: Secondary | ICD-10-CM | POA: Insufficient documentation

## 2018-02-12 DIAGNOSIS — Z79899 Other long term (current) drug therapy: Secondary | ICD-10-CM | POA: Insufficient documentation

## 2018-02-12 DIAGNOSIS — E785 Hyperlipidemia, unspecified: Secondary | ICD-10-CM | POA: Insufficient documentation

## 2018-02-12 DIAGNOSIS — I10 Essential (primary) hypertension: Secondary | ICD-10-CM | POA: Diagnosis not present

## 2018-02-12 DIAGNOSIS — Z9889 Other specified postprocedural states: Secondary | ICD-10-CM | POA: Insufficient documentation

## 2018-02-12 DIAGNOSIS — I480 Paroxysmal atrial fibrillation: Secondary | ICD-10-CM | POA: Insufficient documentation

## 2018-02-12 DIAGNOSIS — Z885 Allergy status to narcotic agent status: Secondary | ICD-10-CM | POA: Insufficient documentation

## 2018-02-12 DIAGNOSIS — Z888 Allergy status to other drugs, medicaments and biological substances status: Secondary | ICD-10-CM | POA: Diagnosis not present

## 2018-02-12 DIAGNOSIS — E119 Type 2 diabetes mellitus without complications: Secondary | ICD-10-CM | POA: Diagnosis not present

## 2018-02-12 MED ORDER — FLECAINIDE ACETATE 50 MG PO TABS: 75.0000 mg | ORAL_TABLET | Freq: Two times a day (BID) | ORAL | 3 refills | Status: DC

## 2018-02-12 NOTE — Progress Notes (Signed)
Primary Care Physician: Berkley Harvey, NP Referring Physician: Dr. Julieta Gutting Ann Hobbs is a 72 y.o. female with a h/o DM,HTN, typical appearing atrial flutter and atrial fibrillation in the afib clinic for evaluation for increased afib burden. She initially had an episode of typical atrial flutter in 2014 in another hospital.  When she saw Dr. Oval Linsey 3/28, she was in afib, metoprolol was increased and a 24 hour monitor is pending. She has been rx'ed multaq in the past but was too expensive and never started drug. She is in SR today and has noted less afib since increase in metoprolol.Echo in 12/2016 showed normal EF and left atrium was normal in size.No prior stress test, no known CAD.  F/u in afib clinic, 4/25. She is here for recent cardioversion for persistent flutter.I saw her after low risk stress test and she was started on flecainide 50 mg bid. When she came infor f/u EKG, she was in aflutter with RVR, low BP, clammy, in the setting of acute bronchitis.Has seen PCP the day before and started treatment with inhaler rx for cough and was to pick up antibiotic to start. She was so symptomatic/ unstable, I sent her to ER to get cardioverter. However, general cardiology saw her and  decided to increase flecainide and BB and send home when BP came up and HR slowed. However, the next day, her Afib had rapid rates again and she felt worse. Went to [employer] [employer] and was cardioverted. She returned to afib within 24-48 hours. When this occurred, she went back to 50 mg flecainide and lowered dose BB. She continues with bronchitis but is now improving. She has stopped all meds, including inhaler, as she felt this made her afib worse.   F/u in afib clinic, s/p successful cardioversion. She is in SR. She has noted some drops in her blood pressure 23-53 systolic, with which she is symptomatic, and some increase in her Blood Sugars. Her URI is improved but lingering.   Today, she denies  symptoms of palpitations, chest pain,  , orthopnea, PND, lower extremity edema, dizziness, presyncope, syncope, or neurologic sequela.  + for cough, fatigue.The patient is tolerating medications without difficulties and is otherwise without complaint today.   Past Medical History:  Diagnosis Date  . Atrial fibrillation ([place]) 04/25/2016  . Atrial flutter ([place]) 04/25/2016  . Diabetes mellitus without complication ([place])   . Essential hypertension 04/25/2016  . Hyperlipidemia 04/25/2016  . Hypertension   . PONV (postoperative nausea and vomiting)    Past Surgical History:  Procedure Laterality Date  . CARDIOVERSION N/A 02/05/2018   Procedure: CARDIOVERSION;  Surgeon: Josue Hector, MD;  Location: [facility] ENDOSCOPY;  Service: Cardiovascular;  Laterality: N/A;    Current Outpatient Medications  Medication Sig Dispense Refill  . atorvastatin (LIPITOR) 20 MG tablet Take 1 tablet (20 mg total) by mouth daily at 6 PM. 90 tablet 3  . Blood Glucose Monitoring Suppl (GHT BLOOD GLUCOSE MONITOR) w/Device KIT Apply 1 strip topically as directed.    . Calcium Carb-Cholecalciferol (CALCIUM+D3 PO) Take 2 tablets by mouth 2 (two) times daily.    Marland Kitchen ELIQUIS 5 MG TABS tablet TAKE ONE TABLET BY MOUTH TWICE DAILY 180 tablet 1  . flecainide (TAMBOCOR) 50 MG tablet Take 1.5 tablets (75 mg total) by mouth 2 (two) times daily. 90 tablet 3  . metoprolol tartrate (LOPRESSOR) 25 MG tablet Take 12.5 mg by mouth 2 (two) times daily.    . Multiple Vitamins-Minerals (  WOMENS MULTIVITAMIN PLUS PO) Take 1 tablet by mouth daily.    . ReliOn Ultra Thin Lancets MISC Apply 1 strip topically as directed.     No current facility-administered medications for this encounter.     Allergies  Allergen Reactions  . Codeine Nausea And Vomiting  . Prednisone Other (See Comments)    Made the patient overly jittery    Social History   Socioeconomic History  . Marital status: Married    Spouse name: Not on file  . Number of children:  Not on file  . Years of education: Not on file  . Highest education level: Not on file  Occupational History  . Not on file  Social Needs  . Financial resource strain: Not on file  . Food insecurity:    Worry: Not on file    Inability: Not on file  . Transportation needs:    Medical: Not on file    Non-medical: Not on file  Tobacco Use  . Smoking status: Never Smoker  . Smokeless tobacco: Never Used  Substance and Sexual Activity  . Alcohol use: Yes    Comment: occasional social  . Drug use: Never  . Sexual activity: Not on file  Lifestyle  . Physical activity:    Days per week: Not on file    Minutes per session: Not on file  . Stress: Not on file  Relationships  . Social connections:    Talks on phone: Not on file    Gets together: Not on file    Attends religious service: Not on file    Active member of club or organization: Not on file    Attends meetings of clubs or organizations: Not on file    Relationship status: Not on file  . Intimate partner violence:    Fear of current or ex partner: Not on file    Emotionally abused: Not on file    Physically abused: Not on file    Forced sexual activity: Not on file  Other Topics Concern  . Not on file  Social History Narrative  . Not on file    Family History  Problem Relation Age of Onset  . Cancer Father     ROS- All systems are reviewed and negative except as per the HPI above  Physical Exam: Vitals:   02/12/18 0946  BP: 116/74  Pulse: 68  Weight: 162 lb (73.5 kg)  Height: 5' 4.5" (1.638 m)   Wt Readings from Last 3 Encounters:  02/12/18 162 lb (73.5 kg)  02/05/18 160 lb (72.6 kg)  01/31/18 161 lb (73 kg)    Labs: Lab Results  Component Value Date   NA 139 01/27/2018   K 4.0 01/27/2018   CL 105 01/27/2018   CO2 24 01/27/2018   GLUCOSE 110 (H) 01/27/2018   BUN 23 (H) 01/27/2018   CREATININE 0.92 01/27/2018   CALCIUM 9.3 01/27/2018   MG 2.2 01/27/2018   No results found for: INR No  results found for: CHOL, HDL, LDLCALC, TRIG   GEN- The patient is well appearing, alert and oriented x 3 today.   Head- normocephalic, atraumatic Eyes-  Sclera clear, conjunctiva pink Ears- hearing intact Oropharynx- clear Neck- supple, no JVP Lymph- no cervical lymphadenopathy Lungs- Clear to ausculation bilaterally, normal work of breathing Heart- regular rate and rhythm, no murmurs, rubs or gallops, PMI not laterally displaced GI- soft, NT, ND, + BS Extremities- no clubbing, cyanosis, or edema MS- no significant deformity or atrophy Skin-  no rash or lesion Psych- euthymic mood, full affect Neuro- strength and sensation are intact  EKG-  Normal Sinus rhythm at 68 bpm, pr int 194 ms, qrs int 92 ms qtc 469 ms Epic records reviewed    Assessment and Plan: 1. Paroxysmal afib/ typical flutter Recent cardioversion on flecainide, that was successful but ERAF, aggravated with URI Second cardioversion successful  Continue  Flecainide 75 mg bid Will decrease metoprolol tartrate  to 25 mg , 1/2 tab bid, as pt is having some low sys BP's and has also noted that her blood sugars are not as well controlled IF this does not help, will consider low dose  Cardizem or , per pharmD, coreg is the least offensive BB to blood sugar control IF early breakthrough afib, will send for ablation evaluation  Continue eliquis 5 mg bid for a chadsvasc score of at least 4, states no missed doses  2. Bronchitis Has complicated recent treatment of afib with flecainide/cardioversion  Improving If symptoms are not significantly improved in one week, f/u with PCP  F/u with Dr. Oval Linsey 6/18 afib clinic as needed  Butch Penny C. Payeton Germani, [facility] [address] [place], [place] [address] [phone]

## 2018-02-12 NOTE — Patient Instructions (Signed)
Decrease metoprolol to 1/2 tablet twice a day  Send message on mychart in a week with blood pressure and heart rate readings.

## 2018-03-07 ENCOUNTER — Other Ambulatory Visit (HOSPITAL_COMMUNITY): Payer: Self-pay | Admitting: *Deleted

## 2018-03-07 MED ORDER — FLECAINIDE ACETATE 50 MG PO TABS: 75.0000 mg | ORAL_TABLET | Freq: Two times a day (BID) | ORAL | 3 refills | Status: DC

## 2018-03-26 ENCOUNTER — Ambulatory Visit (INDEPENDENT_AMBULATORY_CARE_PROVIDER_SITE_OTHER): Payer: Medicare Other | Admitting: Cardiovascular Disease

## 2018-03-26 ENCOUNTER — Encounter: Payer: Self-pay | Admitting: Cardiovascular Disease

## 2018-03-26 VITALS — BP 100/58 | HR 77 | Ht 64.5 in | Wt 160.2 lb

## 2018-03-26 DIAGNOSIS — E7800 Pure hypercholesterolemia, unspecified: Secondary | ICD-10-CM

## 2018-03-26 DIAGNOSIS — I10 Essential (primary) hypertension: Secondary | ICD-10-CM

## 2018-03-26 DIAGNOSIS — I480 Paroxysmal atrial fibrillation: Secondary | ICD-10-CM

## 2018-03-26 MED ORDER — FLECAINIDE ACETATE 100 MG PO TABS: 100.0000 mg | ORAL_TABLET | Freq: Two times a day (BID) | ORAL | 1 refills | Status: DC

## 2018-03-26 NOTE — Progress Notes (Signed)
Cardiology Office Note   Date:  03/26/2018   ID:  Ann Hobbs, DOB [birth year], MRN [id number]  PCP:  Berkley Harvey, NP  Cardiologist:   Skeet Latch, MD   Chief Complaint  Patient presents with  . Follow-up  . Edema    in left leg, when on feet alot.  . Shortness of Breath    when HR is elevated.      History of Present Illness: Ann Hobbs is a 72 y.o. female with paroxysmal atrial fibrillation/flutter, diabetes, hypertension, and hyperlipidemia who presents for follow up.  [name] presented to the ED in 2012 with atrial flutter that was not diagnosed. She was lost to follow up.  She established care in cardiology clinic 04/2016 due to recurrent palpitations.  Given that her blood pressure is normal and her heart rate is low when in sinus rhythm, she was started on metoprolol as needed. She was also started on Eliquis bid. She had an echo 05/2015 that revealed LVEF 60-65% and was otherwise unremarkable. Thyroid function was within normal limits 11/2015.  [name] was prescribed dronedarone 03/2017 but did not start it due to cost concerns.  At her last appointment 12/2017 she was in atrial fibrillation so metoprolol was increased and she had a 24 hour Holter 01/2018 that showed atrial flutter with RVR and rates in the 130s.   Since her last appointment [name] developed a URI and went back into atrial fibrillation.  She was [facility] [place] and was referred to the ED for cardioversion but instead flecainide was increased.  She has persistent atrial fibrillation and underwent cardioversion 2 days later at [facility].  However she had recurrent atrial fibrillation and had a repeat DCCV on 02/05/18.  Since then she has been feeling generally well.  She has noted 3 episodes of atrial fibrillation.  The last occurred last night.  She went into atrial fibrillation in the evening and her rate was around 130.  She took her evening medications and the rate  decreased to 110 so she was able to sleep.  However upon awakening her heart rate was still around 110.  She went back into sinus rhythm at 8 AM.  She has an app on her phone that shows when her heart is out of rhythm and she checks it twice daily.  She has been concerned that in the past she was always able to tell when she is in atrial fibrillation and she is no longer able to do so.  She is very interested in an ablation.  Past Medical History:  Diagnosis Date  . Atrial fibrillation ([place]) 04/25/2016  . Atrial flutter ([place]) 04/25/2016  . Diabetes mellitus without complication ([place])   . Essential hypertension 04/25/2016  . Hyperlipidemia 04/25/2016  . Hypertension   . PONV (postoperative nausea and vomiting)     Past Surgical History:  Procedure Laterality Date  . CARDIOVERSION N/A 02/05/2018   Procedure: CARDIOVERSION;  Surgeon: Josue Hector, MD;  Location: [facility] ENDOSCOPY;  Service: Cardiovascular;  Laterality: N/A;     Current Outpatient Medications  Medication Sig Dispense Refill  . atorvastatin (LIPITOR) 20 MG tablet Take 1 tablet (20 mg total) by mouth daily at 6 PM. 90 tablet 3  . Blood Glucose Monitoring Suppl (GHT BLOOD GLUCOSE MONITOR) w/Device KIT Apply 1 strip topically as directed.    . Calcium Carb-Cholecalciferol (CALCIUM+D3 PO) Take 2 tablets by mouth 2 (two) times daily.    Marland Kitchen ELIQUIS  5 MG TABS tablet TAKE ONE TABLET BY MOUTH TWICE DAILY 180 tablet 1  . flecainide (TAMBOCOR) 100 MG tablet Take 1 tablet (100 mg total) by mouth 2 (two) times daily. 180 tablet 1  . metoprolol tartrate (LOPRESSOR) 25 MG tablet Take 12.5 mg by mouth 2 (two) times daily.    . Multiple Vitamins-Minerals (WOMENS MULTIVITAMIN PLUS PO) Take 1 tablet by mouth daily.    . ReliOn Ultra Thin Lancets MISC Apply 1 strip topically as directed.     No current facility-administered medications for this visit.     Allergies:   Codeine and Prednisone    Social History:  The patient  reports that she has  never smoked. She has never used smokeless tobacco. She reports that she drinks alcohol. She reports that she does not use drugs.   Family History:  The patient's family history includes Cancer in her father.    ROS:  Please see the history of present illness.   Otherwise, review of systems are positive for knee pain.   All other systems are reviewed and negative.    PHYSICAL EXAM: VS:  BP (!) 100/58   Pulse 77   Ht 5' 4.5" (1.638 m)   Wt 160 lb 3.2 oz (72.7 kg)   BMI 27.07 kg/m  , BMI Body mass index is 27.07 kg/m. GENERAL:  Well appearing HEENT: Pupils equal round and reactive, fundi not visualized, oral mucosa unremarkable NECK:  No jugular venous distention, waveform within normal limits, carotid upstroke brisk and symmetric, no bruits LUNGS:  Clear to auscultation bilaterally HEART:  RRR.  PMI not displaced or sustained,S1 and S2 within normal limits, no S3, no S4, no clicks, no rubs, no murmurs ABD:  Flat, positive bowel sounds normal in frequency in pitch, no bruits, no rebound, no guarding, no midline pulsatile mass, no hepatomegaly, no splenomegaly EXT:  2 plus pulses throughout, no edema, no cyanosis no clubbing SKIN:  No rashes no nodules NEURO:  Cranial nerves II through XII grossly intact, motor grossly intact throughout PSYCH:  Cognitively intact, oriented to person place and time   EKG:  EKG is ordered today. 03/20/17: Sinus rhythm.  Rate 65 bpm. 06/28/16: Sinus rhythm. 83 bpm. Nonspecific ST/T changes. The ekg ordered 04/25/16 demonstrates sinus rhythm.  Rate 67 bpm. 05/18/11: typical atrial flutter.  Rate 130 bpm. 05/18/11: Atrial fibrillation rate 100 bpm. 12/24/17: Atrial fibrillation rate 127 bpm 03/26/18: Sinus rhythm.  Rate 75 bpm.  QTc 460 ms.   Echo 05/24/16: Study Conclusions  - Left ventricle: The cavity size was normal. Systolic function was   normal. The estimated ejection fraction was in the range of 60%   to 65%. Wall motion was normal; there were no  regional wall   motion abnormalities. Left ventricular diastolic function   parameters were normal. - Aortic valve: Trileaflet; normal thickness leaflets. There was   mild regurgitation. - Aortic root: The aortic root was normal in size. - Ascending aorta: The ascending aorta was normal in size. - Mitral valve: Structurally normal valve. There was no   regurgitation. - Left atrium: The atrium was normal in size. - Right ventricle: The cavity size was normal. Wall thickness was   normal. Systolic function was normal. - Tricuspid valve: There was trivial regurgitation. - Pulmonary arteries: Systolic pressure was within the normal   range. - Inferior vena cava: The vessel was normal in size. - Pericardium, extracardiac: There was no pericardial effusion.  Lexiscan Myoview 01/17/18:  Nuclear stress EF:  53%.  There was no ST segment deviation noted during stress.  Defect 1: There is a medium defect of moderate severity present in the basal anterior and mid anterior location.  This is a low risk study.  The study is normal.  The left ventricular ejection fraction is mildly decreased (45-54%).   Normal stress nuclear study with probable breast attenuation but no ischemia; EF low normal at 53 with normal wall motion.   29 Hour Holter Monitor 01/2018:  Quality: Fair.  Baseline artifact. Predominant rhythm: sinus rhythm Average heart rate: 85bpm Max heart rate: 157 bpm Min heart rate: 63 bpm  Atrial flutter with variable ventricular response.  Ventricular rate 130s. PVCs, PACs. Ventricular trigeminy  Recent Labs: 01/27/2018: B Natriuretic Peptide 382.9; BUN 23; Creatinine, Ser 0.92; Hemoglobin 13.7; Magnesium 2.2; Platelets 160; Potassium 4.0; Sodium 139   05/20/15:  LDL 155, triglycerides 132, HDL 43 Hemoglobin A1c 05/20/15: 5.7%12/04/16:  12/04/16:  Total cholesterol 126, triglycerides 76, HDL 42, LDL 76  Lipid Panel No results found for: CHOL, TRIG, HDL, CHOLHDL, VLDL,  LDLCALC, LDLDIRECT    Wt Readings from Last 3 Encounters:  03/26/18 160 lb 3.2 oz (72.7 kg)  02/12/18 162 lb (73.5 kg)  02/05/18 160 lb (72.6 kg)     ASSESSMENT AND PLAN:  # Paroxysmal atrial fibrillation/flutter: Ms. Rueth is having more frequent episodes of atrial fibrillation.  She has undergone DCCV x2 and this persistent episode occurred in the setting of a URI.  However, she continues to have symptomatic, paroxysmal atrial fibrillation.  BP prohibits titration of her nodal agent.  We will increase flecainide to 120m q12h.  Continue metoprolol and Eliquis.  She will be referred to EP for consideration of ablation.  This patients CHA2DS2-VASc Score and unadjusted Ischemic Stroke Rate (% per year) is equal to 3.2 % stroke rate/year from a score of 3  Above score calculated as 1 point each if present [CHF, HTN, DM, Vascular=MI/PAD/Aortic Plaque, Age if 65-74, or Female] Above score calculated as 2 points each if present [Age > 75, or Stroke/TIA/TE]  # Hypertension: BP is controlled on metoprolol.  # Hyperlipidemia: Continue atorvastatin.  LDL was 69 11/2016.  LDL goal <70.  Her PCP manages her lipids.     Current medicines are reviewed at length with the patient today.  The patient does not have concerns regarding medicines.  The following changes have been made: Increase flecainide to 100 mg   Labs/ tests ordered today include:   Orders Placed This Encounter  Procedures  . Ambulatory referral to Cardiac Electrophysiology  . EKG 12-Lead     Disposition:   FU with Eithen Castiglia C. ROval Linsey MD, [facility].  Referral to EP.   Time spent: 45 minutes-Greater than 50% of this time was spent in counseling, explanation of diagnosis, planning of further management, and coordination of care.      Signed, Rabab Currington C. ROval Linsey MD, [facility] 03/26/2018 10:41 AM    [facility]

## 2018-03-26 NOTE — Patient Instructions (Addendum)
Medication Instructions:  INCREASE FLECAINIDE TO 100 MG TWICE A DAY   Labwork: NONE  Testing/Procedures: NONE  Follow-Up: Your physician recommends that you schedule a follow-up appointment in: 2 MONTHS   You have been referred to ELECTROPHYSIOLOGY (EP) AT [address] OFFICE IF YOU DO NOT HEAR FROM THE OFFICE BY THE END OF THE WEEK CALL BACK [phone]   If you need a refill on your cardiac medications before your next appointment, please call your pharmacy.

## 2018-04-10 ENCOUNTER — Institutional Professional Consult (permissible substitution): Payer: Medicare Other | Admitting: Cardiology

## 2018-05-06 ENCOUNTER — Encounter: Payer: Self-pay | Admitting: Internal Medicine

## 2018-05-06 ENCOUNTER — Encounter

## 2018-05-06 ENCOUNTER — Ambulatory Visit (INDEPENDENT_AMBULATORY_CARE_PROVIDER_SITE_OTHER): Payer: Medicare Other | Admitting: Internal Medicine

## 2018-05-06 VITALS — BP 138/40 | HR 64 | Ht 64.5 in | Wt 164.0 lb

## 2018-05-06 DIAGNOSIS — I483 Typical atrial flutter: Secondary | ICD-10-CM

## 2018-05-06 DIAGNOSIS — I10 Essential (primary) hypertension: Secondary | ICD-10-CM | POA: Diagnosis not present

## 2018-05-06 DIAGNOSIS — I480 Paroxysmal atrial fibrillation: Secondary | ICD-10-CM

## 2018-05-06 NOTE — H&P (View-Only) (Signed)
Electrophysiology Office Note   Date:  05/06/2018   ID:  Ann Hobbs, DOB 09-Jun-1946, MRN [id number]  PCP:  Ann Harvey, NP  Cardiologist:  Dr Ann Hobbs Primary Electrophysiologist: Ann Grayer, MD    CC: afib   History of Present Illness: Ann Hobbs is a 72 y.o. female who presents today for electrophysiology evaluation.   She is referred by Dr Ann Hobbs for EP consultation regarding atrial fibrillation and atrial flutter.  She reports that she was initially diagnosed with typical atrial flutter in 2012.  She did well until 2017 when she developed recurrent palpitations.  She has been documented to have both atrial fibrillation and atrial flutter. She has been treated with eliquis.  She has since been treated with flecainide.  She continues to have episodes of afib.  She also has fatigue with flecainide.  She is unaware of triggers/ precipitants.  She has reduced caffeine and ETOh without improvement.  Today, she denies symptoms of palpitations, chest pain, shortness of breath, orthopnea, PND, lower extremity edema, claudication, dizziness, presyncope, syncope, bleeding, or neurologic sequela. The patient is tolerating medications without difficulties and is otherwise without complaint today.    Past Medical History:  Diagnosis Date  . Diabetes mellitus without complication ([place])   . Essential hypertension 04/25/2016  . Hyperlipidemia 04/25/2016  . Hypertension   . Paroxysmal atrial fibrillation ([place]) 04/25/2016  . PONV (postoperative nausea and vomiting)   . Typical atrial flutter ([place]) 04/25/2016   Past Surgical History:  Procedure Laterality Date  . CARDIOVERSION N/A 02/05/2018   Procedure: CARDIOVERSION;  Surgeon: Ann Hector, MD;  Location: [facility] ENDOSCOPY;  Service: Cardiovascular;  Laterality: N/A;     Current Outpatient Medications  Medication Sig Dispense Refill  . atorvastatin (LIPITOR) 20 MG tablet Take 1 tablet (20 mg total) by mouth daily at 6 PM.  90 tablet 3  . Blood Glucose Monitoring Suppl (GHT BLOOD GLUCOSE MONITOR) w/Device KIT Apply 1 strip topically as directed.    Marland Kitchen ELIQUIS 5 MG TABS tablet TAKE ONE TABLET BY MOUTH TWICE DAILY 180 tablet 1  . flecainide (TAMBOCOR) 100 MG tablet Take 1 tablet (100 mg total) by mouth 2 (two) times daily. 180 tablet 1  . metoprolol tartrate (LOPRESSOR) 25 MG tablet Take 12.5 mg by mouth 2 (two) times daily.    . Multiple Vitamins-Minerals (WOMENS MULTIVITAMIN PLUS PO) Take 1 tablet by mouth daily.    . ReliOn Ultra Thin Lancets MISC Apply 1 strip topically as directed.    . Calcium Carb-Cholecalciferol (CALCIUM+D3 PO) Take 2 tablets by mouth 2 (two) times daily.     No current facility-administered medications for this visit.     Allergies:   Codeine and Prednisone   Social History:  The patient  reports that she has never smoked. She has never used smokeless tobacco. She reports that she drinks alcohol. She reports that she does not use drugs.   Family History:  The patient's  family history includes Cancer in her father.    ROS:  Please see the history of present illness.   All other systems are personally reviewed and negative.    PHYSICAL EXAM: VS:  BP (!) 138/40   Pulse 64   Ht 5' 4.5" (1.638 m)   Wt 164 lb (74.4 kg)   BMI 27.72 kg/m  , BMI Body mass index is 27.72 kg/m. GEN: Well nourished, well developed, in no acute distress  HEENT: normal  Neck: no JVD, carotid bruits, or masses Cardiac:  RRR; no murmurs, rubs, or gallops,no edema  Respiratory:  clear to auscultation bilaterally, normal work of breathing GI: soft, nontender, nondistended, + BS MS: no deformity or atrophy  Skin: warm and dry  Neuro:  Strength and sensation are intact Psych: euthymic mood, full affect  EKG:  EKG is ordered today. The ekg ordered today is personally reviewed and shows sinus rhythm 64 bpm, PR 190 msec, QRS 90 msec, Qtc 468 msec  ekg 2012 is reviewed and reveals typical atrial  flutter  Recent Labs: 01/27/2018: B Natriuretic Peptide 382.9; BUN 23; Creatinine, Ser 0.92; Hemoglobin 13.7; Magnesium 2.2; Platelets 160; Potassium 4.0; Sodium 139  personally reviewed   Lipid Panel  No results found for: CHOL, TRIG, HDL, CHOLHDL, VLDL, LDLCALC, LDLDIRECT personally reviewed   Wt Readings from Last 3 Encounters:  05/06/18 164 lb (74.4 kg)  03/26/18 160 lb 3.2 oz (72.7 kg)  02/12/18 162 lb (73.5 kg)      Other studies personally reviewed: Additional studies/ records that were reviewed today include: echo 05/24/16 reveals EF 60%, mild AI, no MR, normal LA size  Review of the above records today demonstrates: as above   ASSESSMENT AND PLAN:  1.  The patient has symptomatic, recurrent paroxysmal atrial fibrillation and typical atrial flutter. she has failed medical therapy with flecainide.  Her afib appears to be becoming more persistent. Chads2vasc score is 4.  she is anticoagulated with eliquis . Therapeutic strategies for afib including medicine and ablation were discussed in detail with the patient today. Risk, benefits, and alternatives to EP study and radiofrequency ablation for afib were also discussed in detail today. These risks include but are not limited to stroke, bleeding, vascular damage, tamponade, perforation, damage to the esophagus, lungs, and other structures, pulmonary vein stenosis, worsening renal function, and death. The patient understands these risk and wishes to proceed.  We will therefore proceed with catheter ablation at the next available time.  Carto, ICE, anesthesia are requested for the procedure.  Will also obtain cardiac CT prior to the procedure to exclude LAA thrombus and further evaluate atrial anatomy.  2. HTN Stable No change required today   Current medicines are reviewed at length with the patient today.   The patient does not have concerns regarding her medicines.  The following changes were made today:  none  Labs/ tests  ordered today include:  Orders Placed This Encounter  Procedures  . EKG 12-Lead     Signed, Ann Grayer, MD  05/06/2018 4:17 PM     Ann Hobbs [id number] [phone] (office) [phone] (fax)

## 2018-05-06 NOTE — Patient Instructions (Addendum)
Medication Instructions:  Your physician recommends that you continue on your current medications as directed. Please refer to the Current Medication list given to you today. If you need a refill on your cardiac medications before your next appointment, please call your pharmacy.  Do NOT miss any doses of your Eliquis or your procedure will be rescheduled.  Labwork: You will get lab work today:  BMP and CBC.  Testing/Procedures:  Your physician has requested that you have cardiac CT. Cardiac computed tomography (CT) is a painless test that uses an x-ray machine to take clear, detailed pictures of your heart. For further information please visit [email or web address] Please follow instruction sheet as given.  Schedule cardiac CT  Your physician has recommended that you have an ablation. Catheter ablation is a medical procedure used to treat some cardiac arrhythmias (irregular heartbeats). During catheter ablation, a long, thin, flexible tube is put into a blood vessel in your groin (upper thigh), or neck. This tube is called an ablation catheter. It is then guided to your heart through the blood vessel. Radio frequency waves destroy small areas of heart tissue where abnormal heartbeats may cause an arrhythmia to start. Please see the instruction sheet given to you today.  Follow-Up: You will follow up with Rudi Coco, NP with the Atrial fibrillation (Afib) clinic 4 weeks after your ablation.  You will follow up with Dr. Johney Frame 3 months after your procedure.  CARDIAC CT INSTRUCTIONS:  Please arrive at the [facility] main entrance of [facility]. [facility] [address] [place], [place] [address] [phone]  Proceed to the [facility] Radiology Department (First Floor).  Please follow these instructions carefully (unless otherwise directed):  On the Night Before the Test: . Drink plenty of water. . Do not consume any caffeinated/decaffeinated beverages or  chocolate 12 hours prior to your test. . Do not take any antihistamines 12 hours prior to your test.  On the Day of the Test: . Drink plenty of water. Do not drink any water within one hour of the test. . Do not eat any food 4 hours prior to the test. . You may take your regular medications prior to the test. . Take your METOPROLOL before your test!!!  After the Test: . Drink plenty of water. . After receiving IV contrast, you may experience a mild flushed feeling. This is normal. . On occasion, you may experience a mild rash up to 24 hours after the test. This is not dangerous. If this occurs, you can take Benadryl 25 mg and increase your fluid intake. . If you experience trouble breathing, this can be serious. If it is severe call 911 IMMEDIATELY. If it is mild, please call our office.   ABLATION INSTRUCTIONS:  Please arrive at the [facility] main entrance of [facility] at: 9:00 am on May 30, 2018 Do not eat or drink after midnight prior to procedure On the morning of your procedure do not take any medications. Plan for one night stay.  You will need someone to drive you home at discharge.    Cardiac Ablation Cardiac ablation is a procedure to disable (ablate) a small amount of heart tissue in very specific places. The heart has many electrical connections. Sometimes these connections are abnormal and can cause the heart to beat very fast or irregularly. Ablating some of the problem areas can improve the heart rhythm or return it to normal. Ablation may be done for people who:  Have Wolff-Parkinson-White syndrome.  Have fast heart rhythms (tachycardia).  Have taken medicines for an abnormal heart rhythm (arrhythmia) that were not effective or caused side effects.  Have a high-risk heartbeat that may be life-threatening.  During the procedure, a small incision is made in the neck or the groin, and a long, thin, flexible tube (catheter) is inserted into the incision  and moved to the heart. Small devices (electrodes) on the tip of the catheter will send out electrical currents. A type of X-ray (fluoroscopy) will be used to help guide the catheter and to provide images of the heart. Tell a health care provider about:  Any allergies you have.  All medicines you are taking, including vitamins, herbs, eye drops, creams, and over-the-counter medicines.  Any problems you or family members have had with anesthetic medicines.  Any blood disorders you have.  Any surgeries you have had.  Any medical conditions you have, such as kidney failure.  Whether you are pregnant or may be pregnant. What are the risks? Generally, this is a safe procedure. However, problems may occur, including:  Infection.  Bruising and bleeding at the catheter insertion site.  Bleeding into the chest, especially into the sac that surrounds the heart. This is a serious complication.  Stroke or blood clots.  Damage to other structures or organs.  Allergic reaction to medicines or dyes.  Need for a permanent pacemaker if the normal electrical system is damaged. A pacemaker is a small computer that sends electrical signals to the heart and helps your heart beat normally.  The procedure not being fully effective. This may not be recognized until months later. Repeat ablation procedures are sometimes required.  What happens before the procedure?  Follow instructions from your health care provider about eating or drinking restrictions.  Ask your health care provider about: ? Changing or stopping your regular medicines. This is especially important if you are taking diabetes medicines or blood thinners. ? Taking medicines such as aspirin and ibuprofen. These medicines can thin your blood. Do not take these medicines before your procedure if your health care provider instructs you not to.  Plan to have someone take you home from the hospital or clinic.  If you will be going home  right after the procedure, plan to have someone with you for 24 hours. What happens during the procedure?  To lower your risk of infection: ? Your health care team will wash or sanitize their hands. ? Your skin will be washed with soap. ? Hair may be removed from the incision area.  An IV tube will be inserted into one of your veins.  You will be given a medicine to help you relax (sedative).  The skin on your neck or groin will be numbed.  An incision will be made in your neck or your groin.  A needle will be inserted through the incision and into a large vein in your neck or groin.  A catheter will be inserted into the needle and moved to your heart.  Dye may be injected through the catheter to help your surgeon see the area of the heart that needs treatment.  Electrical currents will be sent from the catheter to ablate heart tissue in desired areas. There are three types of energy that may be used to ablate heart tissue: ? Heat (radiofrequency energy). ? Laser energy. ? Extreme cold (cryoablation).  When the necessary tissue has been ablated, the catheter will be removed.  Pressure will be held on the catheter insertion area  to prevent excessive bleeding.  A bandage (dressing) will be placed over the catheter insertion area. The procedure may vary among health care providers and hospitals. What happens after the procedure?  Your blood pressure, heart rate, breathing rate, and blood oxygen level will be monitored until the medicines you were given have worn off.  Your catheter insertion area will be monitored for bleeding. You will need to lie still for a few hours to ensure that you do not bleed from the catheter insertion area.  Do not drive for 24 hours or as long as directed by your health care provider. Summary  Cardiac ablation is a procedure to disable (ablate) a small amount of heart tissue in very specific places. Ablating some of the problem areas can improve the  heart rhythm or return it to normal.  During the procedure, electrical currents will be sent from the catheter to ablate heart tissue in desired areas. This information is not intended to replace advice given to you by your health care provider. Make sure you discuss any questions you have with your health care provider. Document Released: 02/11/2009 Document Revised: 08/14/2016 Document Reviewed: 08/14/2016 Elsevier Interactive Patient Education  [employer]2018 Elsevier Inc.

## 2018-05-06 NOTE — Progress Notes (Signed)
 Electrophysiology Office Note   Date:  05/06/2018   ID:  Ann Hobbs, DOB 05/22/1946, MRN [id number]  PCP:  Jones, Penny L, NP  Cardiologist:  Dr [place] Primary Electrophysiologist: Shiron Whetsel, MD    CC: afib   History of Present Illness: Ann Hobbs is a 71 y.o. female who presents today for electrophysiology evaluation.   She is referred by Dr Hollis for EP consultation regarding atrial fibrillation and atrial flutter.  She reports that she was initially diagnosed with typical atrial flutter in 2012.  She did well until 2017 when she developed recurrent palpitations.  She has been documented to have both atrial fibrillation and atrial flutter. She has been treated with eliquis.  She has since been treated with flecainide.  She continues to have episodes of afib.  She also has fatigue with flecainide.  She is unaware of triggers/ precipitants.  She has reduced caffeine and ETOh without improvement.  Today, she denies symptoms of palpitations, chest pain, shortness of breath, orthopnea, PND, lower extremity edema, claudication, dizziness, presyncope, syncope, bleeding, or neurologic sequela. The patient is tolerating medications without difficulties and is otherwise without complaint today.    Past Medical History:  Diagnosis Date  . Diabetes mellitus without complication (HCC)   . Essential hypertension 04/25/2016  . Hyperlipidemia 04/25/2016  . Hypertension   . Paroxysmal atrial fibrillation (HCC) 04/25/2016  . PONV (postoperative nausea and vomiting)   . Typical atrial flutter (HCC) 04/25/2016   Past Surgical History:  Procedure Laterality Date  . CARDIOVERSION N/A 02/05/2018   Procedure: CARDIOVERSION;  Surgeon: Nishan, Peter C, MD;  Location: [facility] ENDOSCOPY;  Service: Cardiovascular;  Laterality: N/A;     Current Outpatient Medications  Medication Sig Dispense Refill  . atorvastatin (LIPITOR) 20 MG tablet Take 1 tablet (20 mg total) by mouth daily at 6 PM.  90 tablet 3  . Blood Glucose Monitoring Suppl (GHT BLOOD GLUCOSE MONITOR) w/Device KIT Apply 1 strip topically as directed.    . ELIQUIS 5 MG TABS tablet TAKE ONE TABLET BY MOUTH TWICE DAILY 180 tablet 1  . flecainide (TAMBOCOR) 100 MG tablet Take 1 tablet (100 mg total) by mouth 2 (two) times daily. 180 tablet 1  . metoprolol tartrate (LOPRESSOR) 25 MG tablet Take 12.5 mg by mouth 2 (two) times daily.    . Multiple Vitamins-Minerals (WOMENS MULTIVITAMIN PLUS PO) Take 1 tablet by mouth daily.    . ReliOn Ultra Thin Lancets MISC Apply 1 strip topically as directed.    . Calcium Carb-Cholecalciferol (CALCIUM+D3 PO) Take 2 tablets by mouth 2 (two) times daily.     No current facility-administered medications for this visit.     Allergies:   Codeine and Prednisone   Social History:  The patient  reports that she has never smoked. She has never used smokeless tobacco. She reports that she drinks alcohol. She reports that she does not use drugs.   Family History:  The patient's  family history includes Cancer in her father.    ROS:  Please see the history of present illness.   All other systems are personally reviewed and negative.    PHYSICAL EXAM: VS:  BP (!) 138/40   Pulse 64   Ht 5' 4.5" (1.638 m)   Wt 164 lb (74.4 kg)   BMI 27.72 kg/m  , BMI Body mass index is 27.72 kg/m. GEN: Well nourished, well developed, in no acute distress  HEENT: normal  Neck: no JVD, carotid bruits, or masses Cardiac:   RRR; no murmurs, rubs, or gallops,no edema  Respiratory:  clear to auscultation bilaterally, normal work of breathing GI: soft, nontender, nondistended, + BS MS: no deformity or atrophy  Skin: warm and dry  Neuro:  Strength and sensation are intact Psych: euthymic mood, full affect  EKG:  EKG is ordered today. The ekg ordered today is personally reviewed and shows sinus rhythm 64 bpm, PR 190 msec, QRS 90 msec, Qtc 468 msec  ekg 2012 is reviewed and reveals typical atrial  flutter  Recent Labs: 01/27/2018: B Natriuretic Peptide 382.9; BUN 23; Creatinine, Ser 0.92; Hemoglobin 13.7; Magnesium 2.2; Platelets 160; Potassium 4.0; Sodium 139  personally reviewed   Lipid Panel  No results found for: CHOL, TRIG, HDL, CHOLHDL, VLDL, LDLCALC, LDLDIRECT personally reviewed   Wt Readings from Last 3 Encounters:  05/06/18 164 lb (74.4 kg)  03/26/18 160 lb 3.2 oz (72.7 kg)  02/12/18 162 lb (73.5 kg)      Other studies personally reviewed: Additional studies/ records that were reviewed today include: echo 05/24/16 reveals EF 60%, mild AI, no MR, normal LA size  Review of the above records today demonstrates: as above   ASSESSMENT AND PLAN:  1.  The patient has symptomatic, recurrent paroxysmal atrial fibrillation and typical atrial flutter. she has failed medical therapy with flecainide.  Her afib appears to be becoming more persistent. Chads2vasc score is 4.  she is anticoagulated with eliquis . Therapeutic strategies for afib including medicine and ablation were discussed in detail with the patient today. Risk, benefits, and alternatives to EP study and radiofrequency ablation for afib were also discussed in detail today. These risks include but are not limited to stroke, bleeding, vascular damage, tamponade, perforation, damage to the esophagus, lungs, and other structures, pulmonary vein stenosis, worsening renal function, and death. The patient understands these risk and wishes to proceed.  We will therefore proceed with catheter ablation at the next available time.  Carto, ICE, anesthesia are requested for the procedure.  Will also obtain cardiac CT prior to the procedure to exclude LAA thrombus and further evaluate atrial anatomy.  2. HTN Stable No change required today   Current medicines are reviewed at length with the patient today.   The patient does not have concerns regarding her medicines.  The following changes were made today:  none  Labs/ tests  ordered today include:  Orders Placed This Encounter  Procedures  . EKG 12-Lead     Signed, Thompson Grayer, MD  05/06/2018 4:17 PM     [address] [place] [address] [phone] (office) [phone] (fax)

## 2018-05-07 LAB — CBC WITH DIFFERENTIAL/PLATELET
BASOS ABS: 0 10*3/uL (ref 0.0–0.2)
Basos: 1 %
EOS (ABSOLUTE): 0.1 10*3/uL (ref 0.0–0.4)
Eos: 2 %
HEMOGLOBIN: 12.1 g/dL (ref 11.1–15.9)
Hematocrit: 38.8 % (ref 34.0–46.6)
IMMATURE GRANS (ABS): 0 10*3/uL (ref 0.0–0.1)
IMMATURE GRANULOCYTES: 0 %
LYMPHS: 41 %
Lymphocytes Absolute: 2.8 10*3/uL (ref 0.7–3.1)
MCH: 30.9 pg (ref 26.6–33.0)
MCHC: 31.2 g/dL — ABNORMAL LOW (ref 31.5–35.7)
MCV: 99 fL — ABNORMAL HIGH (ref 79–97)
MONOCYTES: 6 %
Monocytes Absolute: 0.4 10*3/uL (ref 0.1–0.9)
NEUTROS PCT: 50 %
Neutrophils Absolute: 3.5 10*3/uL (ref 1.4–7.0)
Platelets: 181 10*3/uL (ref 150–450)
RBC: 3.91 x10E6/uL (ref 3.77–5.28)
RDW: 15.1 % (ref 12.3–15.4)
WBC: 6.9 10*3/uL (ref 3.4–10.8)

## 2018-05-07 LAB — BASIC METABOLIC PANEL
BUN/Creatinine Ratio: 21 (ref 12–28)
BUN: 18 mg/dL (ref 8–27)
CALCIUM: 9.8 mg/dL (ref 8.7–10.3)
CO2: 25 mmol/L (ref 20–29)
Chloride: 104 mmol/L (ref 96–106)
Creatinine, Ser: 0.86 mg/dL (ref 0.57–1.00)
GFR calc Af Amer: 80 mL/min/{1.73_m2} (ref >59)
GFR calc non Af Amer: 69 mL/min/{1.73_m2} (ref >59)
Glucose: 93 mg/dL (ref 65–99)
Potassium: 4.8 mmol/L (ref 3.5–5.2)
Sodium: 143 mmol/L (ref 134–144)

## 2018-05-28 ENCOUNTER — Encounter (HOSPITAL_COMMUNITY): Payer: Self-pay

## 2018-05-28 ENCOUNTER — Ambulatory Visit (HOSPITAL_COMMUNITY)
Admission: RE | Admit: 2018-05-28 | Discharge: 2018-05-28 | Disposition: A | Discharge status: Home / Self Care | Payer: Medicare Other | Source: Ambulatory Visit | Attending: Internal Medicine | Admitting: Internal Medicine

## 2018-05-28 DIAGNOSIS — I480 Paroxysmal atrial fibrillation: Secondary | ICD-10-CM | POA: Diagnosis present

## 2018-05-28 DIAGNOSIS — I483 Typical atrial flutter: Secondary | ICD-10-CM | POA: Diagnosis present

## 2018-05-28 DIAGNOSIS — I10 Essential (primary) hypertension: Secondary | ICD-10-CM | POA: Diagnosis present

## 2018-05-28 DIAGNOSIS — I4891 Unspecified atrial fibrillation: Secondary | ICD-10-CM

## 2018-05-28 MED ORDER — IOPAMIDOL (ISOVUE-370) INJECTION 76%
INTRAVENOUS | Status: AC
  Administered 2018-05-28: 80 mL
  Filled 2018-05-28: qty 100

## 2018-05-30 ENCOUNTER — Ambulatory Visit (HOSPITAL_COMMUNITY): Payer: Medicare Other | Admitting: Anesthesiology

## 2018-05-30 ENCOUNTER — Ambulatory Visit (HOSPITAL_COMMUNITY)
Admission: RE | Admit: 2018-05-30 | Discharge: 2018-05-30 | Disposition: A | Discharge status: Home / Self Care | Payer: Medicare Other | Source: Ambulatory Visit | Attending: Internal Medicine | Admitting: Internal Medicine

## 2018-05-30 ENCOUNTER — Encounter (HOSPITAL_COMMUNITY): Payer: Self-pay | Admitting: Anesthesiology

## 2018-05-30 ENCOUNTER — Encounter (HOSPITAL_COMMUNITY)
Admission: RE | Disposition: A | Discharge status: Home / Self Care | Payer: Self-pay | Source: Ambulatory Visit | Attending: Internal Medicine

## 2018-05-30 ENCOUNTER — Other Ambulatory Visit: Payer: Self-pay

## 2018-05-30 DIAGNOSIS — Z79899 Other long term (current) drug therapy: Secondary | ICD-10-CM | POA: Insufficient documentation

## 2018-05-30 DIAGNOSIS — I480 Paroxysmal atrial fibrillation: Secondary | ICD-10-CM | POA: Diagnosis not present

## 2018-05-30 DIAGNOSIS — I483 Typical atrial flutter: Secondary | ICD-10-CM

## 2018-05-30 DIAGNOSIS — Z7901 Long term (current) use of anticoagulants: Secondary | ICD-10-CM | POA: Insufficient documentation

## 2018-05-30 DIAGNOSIS — I10 Essential (primary) hypertension: Secondary | ICD-10-CM | POA: Diagnosis not present

## 2018-05-30 DIAGNOSIS — E785 Hyperlipidemia, unspecified: Secondary | ICD-10-CM | POA: Insufficient documentation

## 2018-05-30 DIAGNOSIS — Z9889 Other specified postprocedural states: Secondary | ICD-10-CM | POA: Insufficient documentation

## 2018-05-30 DIAGNOSIS — E119 Type 2 diabetes mellitus without complications: Secondary | ICD-10-CM | POA: Insufficient documentation

## 2018-05-30 HISTORY — PX: ATRIAL FIBRILLATION ABLATION: EP1191

## 2018-05-30 LAB — POCT ACTIVATED CLOTTING TIME: ACTIVATED CLOTTING TIME: 142 s

## 2018-05-30 LAB — GLUCOSE, CAPILLARY
Glucose-Capillary: 106 mg/dL — ABNORMAL HIGH (ref 70–99)
Glucose-Capillary: 100 mg/dL — ABNORMAL HIGH (ref 70–99)

## 2018-05-30 SURGERY — ATRIAL FIBRILLATION ABLATION
Anesthesia: General

## 2018-05-30 MED ORDER — ROCURONIUM BROMIDE 10 MG/ML (PF) SYRINGE
PREFILLED_SYRINGE | INTRAVENOUS | Status: DC | PRN
  Administered 2018-05-30: 20 mg via INTRAVENOUS
  Administered 2018-05-30: 50 mg via INTRAVENOUS

## 2018-05-30 MED ORDER — PANTOPRAZOLE SODIUM 40 MG PO TBEC: 40.0000 mg | DELAYED_RELEASE_TABLET | Freq: Every day | ORAL | 0 refills | Status: DC

## 2018-05-30 MED ORDER — ACETAMINOPHEN 325 MG PO TABS: 650.0000 mg | ORAL_TABLET | ORAL | Status: DC | PRN

## 2018-05-30 MED ORDER — FENTANYL CITRATE (PF) 100 MCG/2ML IJ SOLN: 25.0000 ug | INTRAMUSCULAR | Status: DC | PRN

## 2018-05-30 MED ORDER — APIXABAN 5 MG PO TABS: 5.0000 mg | ORAL_TABLET | Freq: Two times a day (BID) | ORAL | Status: DC

## 2018-05-30 MED ORDER — SODIUM CHLORIDE 0.9 % IV SOLN
INTRAVENOUS | Status: DC
  Administered 2018-05-30 (×2): via INTRAVENOUS

## 2018-05-30 MED ORDER — LIDOCAINE 2% (20 MG/ML) 5 ML SYRINGE
INTRAMUSCULAR | Status: DC | PRN
  Administered 2018-05-30: 80 mg via INTRAVENOUS

## 2018-05-30 MED ORDER — HEPARIN SODIUM (PORCINE) 1000 UNIT/ML IJ SOLN
INTRAMUSCULAR | Status: DC | PRN
  Administered 2018-05-30: 12000 [IU] via INTRAVENOUS
  Administered 2018-05-30 (×2): 1000 [IU] via INTRAVENOUS

## 2018-05-30 MED ORDER — BUPIVACAINE HCL (PF) 0.25 % IJ SOLN
INTRAMUSCULAR | Status: DC | PRN
  Administered 2018-05-30: 30 mL

## 2018-05-30 MED ORDER — FENTANYL CITRATE (PF) 100 MCG/2ML IJ SOLN
INTRAMUSCULAR | Status: DC | PRN
  Administered 2018-05-30: 50 ug via INTRAVENOUS
  Administered 2018-05-30: 100 ug via INTRAVENOUS

## 2018-05-30 MED ORDER — SODIUM CHLORIDE 0.9% FLUSH: 3.0000 mL | INTRAVENOUS | Status: DC | PRN

## 2018-05-30 MED ORDER — HEPARIN SODIUM (PORCINE) 1000 UNIT/ML IJ SOLN
INTRAMUSCULAR | Status: AC
  Filled 2018-05-30: qty 1

## 2018-05-30 MED ORDER — ISOPROTERENOL HCL 0.2 MG/ML IJ SOLN
INTRAVENOUS | Status: DC | PRN
  Administered 2018-05-30: 20 ug/min via INTRAVENOUS

## 2018-05-30 MED ORDER — HEPARIN (PORCINE) IN NACL 1000-0.9 UT/500ML-% IV SOLN
INTRAVENOUS | Status: AC
  Filled 2018-05-30: qty 500

## 2018-05-30 MED ORDER — ISOPROTERENOL HCL 0.2 MG/ML IJ SOLN
INTRAMUSCULAR | Status: AC
  Filled 2018-05-30: qty 5

## 2018-05-30 MED ORDER — BUPIVACAINE HCL (PF) 0.25 % IJ SOLN
INTRAMUSCULAR | Status: AC
  Filled 2018-05-30: qty 30

## 2018-05-30 MED ORDER — SODIUM CHLORIDE 0.9% FLUSH: 3.0000 mL | Freq: Two times a day (BID) | INTRAVENOUS | Status: DC

## 2018-05-30 MED ORDER — HEPARIN (PORCINE) IN NACL 1000-0.9 UT/500ML-% IV SOLN
INTRAVENOUS | Status: DC | PRN
  Administered 2018-05-30: 500 mL

## 2018-05-30 MED ORDER — DEXAMETHASONE SODIUM PHOSPHATE 10 MG/ML IJ SOLN
INTRAMUSCULAR | Status: DC | PRN
  Administered 2018-05-30: 10 mg via INTRAVENOUS

## 2018-05-30 MED ORDER — OXYCODONE HCL 5 MG/5ML PO SOLN: 5.0000 mg | Freq: Once | ORAL | Status: DC | PRN

## 2018-05-30 MED ORDER — ONDANSETRON HCL 4 MG/2ML IJ SOLN: 4.0000 mg | Freq: Once | INTRAMUSCULAR | Status: DC | PRN

## 2018-05-30 MED ORDER — PROPOFOL 10 MG/ML IV BOLUS
INTRAVENOUS | Status: DC | PRN
  Administered 2018-05-30: 130 mg via INTRAVENOUS

## 2018-05-30 MED ORDER — MIDAZOLAM HCL 5 MG/5ML IJ SOLN
INTRAMUSCULAR | Status: DC | PRN
  Administered 2018-05-30: 2 mg via INTRAVENOUS

## 2018-05-30 MED ORDER — SODIUM CHLORIDE 0.9 % IV SOLN: 250.0000 mL | INTRAVENOUS | Status: DC | PRN

## 2018-05-30 MED ORDER — ONDANSETRON HCL 4 MG/2ML IJ SOLN: 4.0000 mg | Freq: Four times a day (QID) | INTRAMUSCULAR | Status: DC | PRN

## 2018-05-30 MED ORDER — ONDANSETRON HCL 4 MG/2ML IJ SOLN
INTRAMUSCULAR | Status: DC | PRN
  Administered 2018-05-30: 4 mg via INTRAVENOUS

## 2018-05-30 MED ORDER — PROPOFOL 500 MG/50ML IV EMUL
INTRAVENOUS | Status: DC | PRN
  Administered 2018-05-30: 150 ug/kg/min via INTRAVENOUS
  Administered 2018-05-30: 13:00:00 via INTRAVENOUS

## 2018-05-30 MED ORDER — PROTAMINE SULFATE 10 MG/ML IV SOLN
INTRAVENOUS | Status: DC | PRN
  Administered 2018-05-30 (×4): 10 mg via INTRAVENOUS

## 2018-05-30 MED ORDER — SUGAMMADEX SODIUM 200 MG/2ML IV SOLN
INTRAVENOUS | Status: DC | PRN
  Administered 2018-05-30: 150 mg via INTRAVENOUS

## 2018-05-30 MED ORDER — HEPARIN SODIUM (PORCINE) 1000 UNIT/ML IJ SOLN
INTRAMUSCULAR | Status: DC | PRN
  Administered 2018-05-30: 4000 [IU] via INTRAVENOUS
  Administered 2018-05-30: 3000 [IU] via INTRAVENOUS

## 2018-05-30 MED ORDER — OXYCODONE HCL 5 MG PO TABS: 5.0000 mg | ORAL_TABLET | Freq: Once | ORAL | Status: DC | PRN

## 2018-05-30 SURGICAL SUPPLY — 18 items
BLANKET WARM UNDERBOD FULL ACC (MISCELLANEOUS) ×4 IMPLANT
CATH MAPPNG PENTARAY F 2-6-2MM (CATHETERS) IMPLANT
CATH NAVISTAR SMARTTOUCH DF (ABLATOR) ×2 IMPLANT
CATH SOUNDSTAR 3D IMAGING (CATHETERS) ×2 IMPLANT
CATH WEBSTER BI DIR CS D-F CRV (CATHETERS) ×2 IMPLANT
COVER SWIFTLINK CONNECTOR (BAG) ×4 IMPLANT
NDL BAYLIS TRANSSEPTAL 71CM (NEEDLE) IMPLANT
NEEDLE BAYLIS TRANSSEPTAL 71CM (NEEDLE) ×3 IMPLANT
PACK EP LATEX FREE (CUSTOM PROCEDURE TRAY) ×3
PACK EP LF (CUSTOM PROCEDURE TRAY) ×1 IMPLANT
PAD PRO RADIOLUCENT 2001M-C (PAD) ×3 IMPLANT
PATCH CARTO3 (PAD) ×2 IMPLANT
PENTARAY F 2-6-2MM (CATHETERS) ×3
SHEATH AVANTI 11F 11CM (SHEATH) ×2 IMPLANT
SHEATH PINNACLE 7F 10CM (SHEATH) ×4 IMPLANT
SHEATH PINNACLE 9F 10CM (SHEATH) ×2 IMPLANT
SHEATH SWARTZ TS SL2 63CM 8.5F (SHEATH) ×2 IMPLANT
TUBING SMART ABLATE COOLFLOW (TUBING) ×2 IMPLANT

## 2018-05-30 NOTE — Anesthesia Postprocedure Evaluation (Signed)
Anesthesia Post Note  Patient: Ann Hobbs  Procedure(s) Performed: ATRIAL FIBRILLATION ABLATION (N/A )     Patient location during evaluation: PACU Anesthesia Type: General Level of consciousness: awake and alert Pain management: pain level controlled Vital Signs Assessment: post-procedure vital signs reviewed and stable Respiratory status: spontaneous breathing, nonlabored ventilation and respiratory function stable Cardiovascular status: blood pressure returned to baseline and stable Postop Assessment: no apparent nausea or vomiting Anesthetic complications: no    Last Vitals:  Vitals:   05/30/18 1515 05/30/18 1519  BP: (!) 154/68   Pulse: 60 (!) 59  Resp: 14 13  Temp:  (!) 36.1 C  SpO2: 100% 100%    Last Pain:  Vitals:   05/30/18 1519  TempSrc: Temporal  PainSc:                  Beryle Lathehomas E Brock

## 2018-05-30 NOTE — Discharge Summary (Signed)
ELECTROPHYSIOLOGY PROCEDURE DISCHARGE SUMMARY    Patient ID: Ann Hobbs,  MRN: [id number], DOB/AGE: 12-04-1945 72 y.o.  Admit date: 05/30/2018 Discharge date: 05/30/2018   Electrophysiologist: Thompson Grayer, MD  Primary Discharge Diagnosis:  Paroxysmal atrial fibrillation  Secondary Discharge Diagnosis:  Atrial flutter (typical)  Procedures This Admission:  1.  Electrophysiology study and radiofrequency catheter ablation on 05/30/18 by Dr Thompson Grayer.  This study demonstrated successful PVI and CTI ablations.    Brief HPI: Ann Hobbs is a 72 y.o. female with a history of paaroxysmal atrial fibrillation and typical atrial flutter.  They have failed medical therapy with flecainide. Risks, benefits, and alternatives to catheter ablation of atrial fibrillation were reviewed with the patient who wished to proceed.  The patient underwent Cardiac CT prior to the procedure which demonstrated normal LV function and no LAA thrombus.    Hospital Course:  The patient was admitted and underwent EPS/RFCA of atrial fibrillation with details as outlined above.  They were monitored on telemetry which demonstrated sinus rhythm.  Groin was without complication on the day of discharge.  The patient was examined and considered to be stable for discharge.  Wound care and restrictions were reviewed with the patient.  The patient will be seen back by Roderic Palau, NP in 4 weeks and Dr Rayann Heman in 12 weeks for post ablation follow up.   This patients CHA2DS2-VASc Score and unadjusted Ischemic Stroke Rate (% per year) is equal to 3.2 % stroke rate/year from a score of 3 Above score calculated as 1 point each if present [CHF, HTN, DM, Vascular=MI/PAD/Aortic Plaque, Age if 65-74, or Female] Above score calculated as 2 points each if present [Age > 75, or Stroke/TIA/TE]   Physical Exam: Vitals:   05/30/18 1640 05/30/18 1645 05/30/18 1650 05/30/18 1653  BP: (!) 157/60 (!) 143/60    Pulse: (!)  56 (!) 56 (!) 56   Resp: _0 Temp:    (!) 97.3 F (36.3 C)  TempSrc:    Temporal  SpO2: 100% 100% 100%   Weight:      Height:        GEN- The patient is well appearing, alert and oriented x 3 today.   HEENT: normocephalic, atraumatic; sclera clear, conjunctiva pink; hearing intact; oropharynx clear; neck supple  Lungs- Clear to ausculation bilaterally, normal work of breathing.  No wheezes, rales, rhonchi Heart- Regular rate and rhythm, no murmurs, rubs or gallops  GI- soft, non-tender, non-distended, bowel sounds present  Extremities- no clubbing, cyanosis, or edema; DP/PT/radial pulses 2+ bilaterally, groin without hematoma/bruit MS- no significant deformity or atrophy Skin- warm and dry, no rash or lesion Psych- euthymic mood, full affect Neuro- strength and sensation are intact   Labs:   Lab Results  Component Value Date   WBC 6.9 05/06/2018   HGB 12.1 05/06/2018   HCT 38.8 05/06/2018   MCV 99 (H) 05/06/2018   PLT 181 05/06/2018   No results for input(s): NA, K, CL, CO2, BUN, CREATININE, CALCIUM, PROT, BILITOT, ALKPHOS, ALT, AST, GLUCOSE in the last 168 hours.  Invalid input(s): LABALBU   Discharge Medications:  Allergies as of 05/30/2018      Reactions   Codeine Nausea And Vomiting   Prednisone Other (See Comments)   Made patient overly jittery, Can tolerate in low doses for short periods of time      Medication List    STOP taking these medications   flecainide 100 MG tablet Commonly known  as:  TAMBOCOR     TAKE these medications   atorvastatin 20 MG tablet Commonly known as:  LIPITOR Take 1 tablet (20 mg total) by mouth daily at 6 PM.   CALCIUM+D3 600-800 MG-UNIT Tabs Generic drug:  Calcium Carb-Cholecalciferol Take 2 tablets by mouth 2 (two) times daily.   ELIQUIS 5 MG Tabs tablet Generic drug:  apixaban TAKE ONE TABLET BY MOUTH TWICE DAILY   GHT BLOOD GLUCOSE MONITOR w/Device Kit Apply 1 strip topically as directed.   metoprolol  tartrate 25 MG tablet Commonly known as:  LOPRESSOR Take 12.5 mg by mouth 2 (two) times daily.   pantoprazole 40 MG tablet Commonly known as:  PROTONIX Take 1 tablet (40 mg total) by mouth daily.   ReliOn Ultra Thin Lancets Misc Apply 1 strip topically as directed.   WOMENS MULTIVITAMIN PLUS PO Take 1 tablet by mouth daily.       Disposition:   Follow-up Information    [facility] Follow up on 06/26/2018.   Specialty:  Cardiology Why:  AT 10am Contact information: [address] [id number] [place] [address] [phone]       Thompson Grayer, MD Follow up on 09/02/2018.   Specialty:  Cardiology Why:  at [facility] information: [place] [address] [phone]           Duration of Discharge Encounter: Greater than 30 minutes including physician time.  Army Fossa MD 05/30/2018 5:56 PM

## 2018-05-30 NOTE — Progress Notes (Signed)
Three venous sheaths removed and pressure held for 10 minutes. Patient remained hemodynamically stable throughout. Site is Level 0.

## 2018-05-30 NOTE — Transfer of Care (Signed)
Immediate Anesthesia Transfer of Care Note  Patient: Ann Hobbs  Procedure(s) Performed: ATRIAL FIBRILLATION ABLATION (N/A )  Patient Location: Cath Lab  Anesthesia Type:General  Level of Consciousness: awake, patient cooperative and responds to stimulation  Airway & Oxygen Therapy: Patient Spontanous Breathing and Patient connected to face mask oxygen  Post-op Assessment: Report given to RN and Post -op Vital signs reviewed and stable  Post vital signs: Reviewed and stable  Last Vitals:  Vitals Value Taken Time  BP    Temp    Pulse    Resp    SpO2      Last Pain:  Vitals:   05/30/18 0935  TempSrc:   PainSc: 0-No pain      Patients Stated Pain Goal: 5 (05/30/18 0935)  Complications: No apparent anesthesia complications

## 2018-05-30 NOTE — Discharge Instructions (Signed)
No driving for 4 days. No lifting over 5 lbs for 1 week. No sexual activity for 1 week. You may return to work in 1 week. Keep procedure site clean & dry. If you notice increased pain, swelling, bleeding or pus, call/return!  You may shower, but no soaking baths/hot tubs/pools for 1 week.  ° ° °You have an appointment set up with the Atrial Fibrillation Clinic.  Multiple studies have shown that being followed by a dedicated atrial fibrillation clinic in addition to the standard care you receive from your other physicians improves health. We believe that enrollment in the atrial fibrillation clinic will allow us to better care for you.  ° °The phone number to the Atrial Fibrillation Clinic is [phone]. The clinic is staffed Monday through Friday from 8:30am to 5pm. ° °Parking Directions: The clinic is located in the Heart and Vascular Building connected to [facility]. °1)From [address] turn on to [address] and go to the 3rd entrance  (Heart and Vascular entrance) on the right. °2)Look to the right for Heart &Vascular Parking Garage. °3)A code for the entrance is required please call the clinic to receive this.   °4)Take the elevators to the 1st floor. Registration is in the room with the glass walls at the end of the hallway. ° °If you have any trouble parking or locating the clinic, please don’t hesitate to call [phone]. ° ° °

## 2018-05-30 NOTE — Anesthesia Procedure Notes (Signed)
Procedure Name: Intubation Date/Time: 05/30/2018 11:56 AM Performed by: Lovie Cholock, Zayla Agar K, CRNA Pre-anesthesia Checklist: Patient identified, Emergency Drugs available, Suction available and Patient being monitored Patient Re-evaluated:Patient Re-evaluated prior to induction Oxygen Delivery Method: Circle System Utilized Preoxygenation: Pre-oxygenation with 100% oxygen Induction Type: IV induction Ventilation: Mask ventilation without difficulty Laryngoscope Size: Miller and 2 Grade View: Grade II Tube type: Oral Tube size: 7.0 mm Number of attempts: 1 Airway Equipment and Method: Stylet Placement Confirmation: ETT inserted through vocal cords under direct vision,  positive ETCO2 and breath sounds checked- equal and bilateral Secured at: 21 cm Tube secured with: Tape Dental Injury: Teeth and Oropharynx as per pre-operative assessment

## 2018-05-30 NOTE — Anesthesia Preprocedure Evaluation (Addendum)
Anesthesia Evaluation  Patient identified by MRN, date of birth, ID band Patient awake    Reviewed: Allergy & Precautions, NPO status , Patient's Chart, lab work & pertinent test results, reviewed documented beta blocker date and time   History of Anesthesia Complications (+) PONV and history of anesthetic complications  Airway Mallampati: II  TM Distance: >3 FB Neck ROM: Full    Dental  (+) Dental Advisory Given, Teeth Intact, Chipped,    Pulmonary neg pulmonary ROS,    breath sounds clear to auscultation       Cardiovascular hypertension, Pt. on home beta blockers and Pt. on medications (-) angina+ dysrhythmias Atrial Fibrillation  Rhythm:Irregular Rate:Normal   '19 Myoperfusion - Nuclear stress EF: 53%. There was no ST segment deviation noted during stress. Defect 1: There is a medium defect of moderate severity present in the basal anterior and mid anterior location. Probable breast attenuation. This is a low risk study. The study is normal. The left ventricular ejection fraction is mildly decreased (45-54%).  '17 TTE - EF 60% to 65%. Mild AI. Trivial TR.    Neuro/Psych negative neurological ROS  negative psych ROS   GI/Hepatic negative GI ROS, Neg liver ROS,   Endo/Other  diabetes, Well Controlled, Type 2  Renal/GU negative Renal ROS  negative genitourinary   Musculoskeletal  (+) Arthritis ,   Abdominal   Peds  Hematology negative hematology ROS (+)   Anesthesia Other Findings   Reproductive/Obstetrics                           Anesthesia Physical Anesthesia Plan  ASA: III  Anesthesia Plan: General   Post-op Pain Management:    Induction: Intravenous  PONV Risk Score and Plan: 4 or greater and Treatment may vary due to age or medical condition, Ondansetron, Dexamethasone and TIVA  Airway Management Planned: Oral ETT  Additional Equipment: None  Intra-op Plan:    Post-operative Plan: Extubation in OR  Informed Consent: I have reviewed the patients History and Physical, chart, labs and discussed the procedure including the risks, benefits and alternatives for the proposed anesthesia with the patient or authorized representative who has indicated his/her understanding and acceptance.   Dental advisory given  Plan Discussed with: CRNA and Anesthesiologist  Anesthesia Plan Comments:        Anesthesia Quick Evaluation

## 2018-05-30 NOTE — Progress Notes (Signed)
Discharge instructions given to patient and husband. They both acknowledged understanding of information given. Old drainage noted to right femoral . Denies pain.

## 2018-05-30 NOTE — Interval H&P Note (Signed)
History and Physical Interval Note:  05/30/2018 11:04 AM  Ann KillSherry J Dante  has presented today for surgery, with the diagnosis of afib  The various methods of treatment have been discussed with the patient and family. After consideration of risks, benefits and other options for treatment, the patient has consented to  Procedure(s): ATRIAL FIBRILLATION ABLATION (N/A) as a surgical intervention .  The patient's history has been reviewed, patient examined, no change in status, stable for surgery.  I have reviewed the patient's chart and labs.  Questions were answered to the patient's satisfaction.    Cardiac CT reviewed with patient.  She reports compliance with eliquis.  Hillis RangeJames Marisel Tostenson

## 2018-05-31 ENCOUNTER — Telehealth: Payer: Self-pay | Admitting: Internal Medicine

## 2018-05-31 ENCOUNTER — Encounter (HOSPITAL_COMMUNITY): Payer: Self-pay | Admitting: Internal Medicine

## 2018-05-31 NOTE — Telephone Encounter (Signed)
Per discharge note Medication List    STOP taking these medications   flecainide 100 MG tablet Commonly known as:  TAMBOCOR    Pt aware ./cy

## 2018-05-31 NOTE — Telephone Encounter (Signed)
New Message      Patient had a abrasion yesterday and she is suppose to take medication this morning; however; patient is confused on which medication she is suppose today. Pls advise.

## 2018-06-03 ENCOUNTER — Ambulatory Visit: Payer: Medicare Other | Admitting: Cardiovascular Disease

## 2018-06-03 LAB — POCT ACTIVATED CLOTTING TIME
ACTIVATED CLOTTING TIME: 263 s
ACTIVATED CLOTTING TIME: 312 s
Activated Clotting Time: 373 seconds

## 2018-06-04 DIAGNOSIS — I481 Persistent atrial fibrillation: Secondary | ICD-10-CM

## 2018-06-24 ENCOUNTER — Ambulatory Visit (INDEPENDENT_AMBULATORY_CARE_PROVIDER_SITE_OTHER): Payer: Medicare Other | Admitting: Cardiovascular Disease

## 2018-06-24 ENCOUNTER — Encounter: Payer: Self-pay | Admitting: Cardiovascular Disease

## 2018-06-24 VITALS — BP 128/70 | HR 80 | Ht 64.0 in | Wt 158.0 lb

## 2018-06-24 DIAGNOSIS — E7800 Pure hypercholesterolemia, unspecified: Secondary | ICD-10-CM

## 2018-06-24 DIAGNOSIS — I480 Paroxysmal atrial fibrillation: Secondary | ICD-10-CM | POA: Diagnosis not present

## 2018-06-24 DIAGNOSIS — I483 Typical atrial flutter: Secondary | ICD-10-CM | POA: Diagnosis not present

## 2018-06-24 DIAGNOSIS — I10 Essential (primary) hypertension: Secondary | ICD-10-CM

## 2018-06-24 NOTE — Patient Instructions (Signed)
Medication Instructions:  Your physician recommends that you continue on your current medications as directed. Please refer to the Current Medication list given to you today.  Labwork: HAVE YOUR PRIMARY CARE FAX YOUR LABS TO [phone] WHEN YOU HAVE THEM DONE   Testing/Procedures: NONE  Follow-Up: Your physician wants you to follow-up in: 1 YEAR  You will receive a reminder letter in the mail two months in advance. If you don't receive a letter, please call our office to schedule the follow-up appointment.  If you need a refill on your cardiac medications before your next appointment, please call your pharmacy.

## 2018-06-24 NOTE — Progress Notes (Signed)
Cardiology Office Note   Date:  06/24/2018   ID:  Ann Hobbs, DOB July 10, 1946, MRN [id number]  PCP:  Berkley Harvey, NP  Cardiologist:   Skeet Latch, MD   No chief complaint on file.    History of Present Illness: Ann Hobbs is a 72 y.o. female with paroxysmal atrial fibrillation/flutter, diabetes, hypertension, and hyperlipidemia who presents for follow up.  Ann Hobbs presented to the ED in 2012 with atrial flutter that was not diagnosed. She was lost to follow up.  She established care in cardiology clinic 04/2016 due to recurrent palpitations.  Given that her blood pressure is normal and her heart rate is low when in sinus rhythm, she was started on metoprolol as needed. She was also started on Eliquis bid. She had an echo 05/2015 that revealed LVEF 60-65% and was otherwise unremarkable. Thyroid function was within normal limits 11/2015.  Ann Hobbs was prescribed dronedarone 03/2017 but did not start it due to cost concerns.  Metoprolol was increased and she had a 24 hour Holter 01/2018 that showed atrial flutter with RVR and rates in the 130s.   Ann Hobbs developed a URI and went back into atrial fibrillation.  She saw Roderic Palau and was referred to the ED for cardioversion but instead flecainide was increased.  She has persistent atrial fibrillation and underwent cardioversion 2 days later at [facility].  However she had recurrent atrial fibrillation and had a repeat DCCV on 02/05/18.  She continued to have episodes of symptom medic atrial fibrillation and underwent ablation 05/30/2018.  Since that time she has experienced some recurrent episodes of atrial fibrillation.  She noted 2 days after the ablation her heart rate was intermittently in the 140s.  Since then she has experienced a few other episodes of atrial fibrillation.  It has been worse in the last week due to an upper respiratory infection.  She is unsure if she had a fever but denies chills.   She feels congested and has a sore throat.  The symptoms became worse over this weekend.  She has some amoxicillin that her husband had leftover from a dental procedure earlier this month and wonders if it would be okay for her to use it.  She has had purulent drainage.  She also noticed that flecainide made her glucose levels elevate.  Typically she is able to control her diabetes with diet.  However, even after stopping the flecainide her glucose levels are still running high.  She is scheduled to follow up in afib clinic later this week.   Past Medical History:  Diagnosis Date  . Diabetes mellitus without complication ([place])   . Essential hypertension 04/25/2016  . Hyperlipidemia 04/25/2016  . Hypertension   . Paroxysmal atrial fibrillation ([place]) 04/25/2016  . PONV (postoperative nausea and vomiting)   . Typical atrial flutter ([place]) 04/25/2016    Past Surgical History:  Procedure Laterality Date  . ATRIAL FIBRILLATION ABLATION N/A 05/30/2018   Procedure: ATRIAL FIBRILLATION ABLATION;  Surgeon: Thompson Grayer, MD;  Location: [place] [facility];  Service: Cardiovascular;  Laterality: N/A;  . CARDIOVERSION N/A 02/05/2018   Procedure: CARDIOVERSION;  Surgeon: Josue Hector, MD;  Location: [facility] ENDOSCOPY;  Service: Cardiovascular;  Laterality: N/A;     Current Outpatient Medications  Medication Sig Dispense Refill  . atorvastatin (LIPITOR) 20 MG tablet Take 1 tablet (20 mg total) by mouth daily at 6 PM. 90 tablet 3  . Blood Glucose Monitoring Suppl (GHT  BLOOD GLUCOSE MONITOR) w/Device KIT Apply 1 strip topically as directed.    . Calcium Carb-Cholecalciferol (CALCIUM+D3) 600-800 MG-UNIT TABS Take 2 tablets by mouth 2 (two) times daily.     Marland Kitchen ELIQUIS 5 MG TABS tablet TAKE ONE TABLET BY MOUTH TWICE DAILY 180 tablet 1  . metoprolol tartrate (LOPRESSOR) 25 MG tablet Take 12.5 mg by mouth 2 (two) times daily.    . Multiple Vitamins-Minerals (WOMENS MULTIVITAMIN PLUS PO) Take 1 tablet by mouth  daily.    . ReliOn Ultra Thin Lancets MISC Apply 1 strip topically as directed.     No current facility-administered medications for this visit.     Allergies:   Codeine and Prednisone    Social History:  The patient  reports that she has never smoked. She has never used smokeless tobacco. She reports that she drinks alcohol. She reports that she does not use drugs.   Family History:  The patient's family history includes Cancer in her father.    ROS:  Please see the history of present illness.   Otherwise, review of systems are positive for knee pain.   All other systems are reviewed and negative.    PHYSICAL EXAM: VS:  BP 128/70   Pulse 80   Ht _0  (1.626 m)   Wt 158 lb (71.7 kg)   BMI 27.12 kg/m  , BMI Body mass index is 27.12 kg/m. GENERAL:  Mildly ill-appearing.  Hoarse voice and frequent cough.  HEENT: Pupils equal round and reactive, fundi not visualized, oral mucosa unremarkable.  Maxillary sinus tenderness to palpation. NECK:  No jugular venous distention, waveform within normal limits, carotid upstroke brisk and symmetric, no bruits, no thyromegaly LYMPHATICS:  No cervical adenopathy LUNGS:  Clear to auscultation bilaterally HEART:  RRR.  PMI not displaced or sustained,S1 and S2 within normal limits, no S3, no S4, no clicks, no rubs, no murmurs ABD:  Flat, positive bowel sounds normal in frequency in pitch, no bruits, no rebound, no guarding, no midline pulsatile mass, no hepatomegaly, no splenomegaly EXT:  2 plus pulses throughout, no edema, no cyanosis no clubbing SKIN:  No rashes no nodules NEURO:  Cranial nerves II through XII grossly intact, motor grossly intact throughout PSYCH:  Cognitively intact, oriented to person place and time   EKG:  EKG is not ordered today. 03/20/17: Sinus rhythm.  Rate 65 bpm. 06/28/16: Sinus rhythm. 83 bpm. Nonspecific ST/T changes. The ekg ordered 04/25/16 demonstrates sinus rhythm.  Rate 67 bpm. 05/18/11: typical atrial flutter.   Rate 130 bpm. 05/18/11: Atrial fibrillation rate 100 bpm. 12/24/17: Atrial fibrillation rate 127 bpm 03/26/18: Sinus rhythm.  Rate 75 bpm.  QTc 460 ms.   Echo 05/24/16: Study Conclusions  - Left ventricle: The cavity size was normal. Systolic function was   normal. The estimated ejection fraction was in the range of 60%   to 65%. Wall motion was normal; there were no regional wall   motion abnormalities. Left ventricular diastolic function   parameters were normal. - Aortic valve: Trileaflet; normal thickness leaflets. There was   mild regurgitation. - Aortic root: The aortic root was normal in size. - Ascending aorta: The ascending aorta was normal in size. - Mitral valve: Structurally normal valve. There was no   regurgitation. - Left atrium: The atrium was normal in size. - Right ventricle: The cavity size was normal. Wall thickness was   normal. Systolic function was normal. - Tricuspid valve: There was trivial regurgitation. - Pulmonary arteries: Systolic pressure was  within the normal   range. - Inferior vena cava: The vessel was normal in size. - Pericardium, extracardiac: There was no pericardial effusion.  Lexiscan Myoview 01/17/18:  Nuclear stress EF: 53%.  There was no ST segment deviation noted during stress.  Defect 1: There is a medium defect of moderate severity present in the basal anterior and mid anterior location.  This is a low risk study.  The study is normal.  The left ventricular ejection fraction is mildly decreased (45-54%).   Normal stress nuclear study with probable breast attenuation but no ischemia; EF low normal at 53 with normal wall motion.   29 Hour Holter Monitor 01/2018:  Quality: Fair.  Baseline artifact. Predominant rhythm: sinus rhythm Average heart rate: 85bpm Max heart rate: 157 bpm Min heart rate: 63 bpm  Atrial flutter with variable ventricular response.  Ventricular rate 130s. PVCs, PACs. Ventricular trigeminy  Recent  Labs: 01/27/2018: B Natriuretic Peptide 382.9; Magnesium 2.2 05/06/2018: BUN 18; Creatinine, Ser 0.86; Hemoglobin 12.1; Platelets 181; Potassium 4.8; Sodium 143   05/20/15:  LDL 155, triglycerides 132, HDL 43 Hemoglobin A1c 05/20/15: 5.7%12/04/16:  12/04/16:  Total cholesterol 126, triglycerides 76, HDL 42, LDL 76  Lipid Panel No results found for: CHOL, TRIG, HDL, CHOLHDL, VLDL, LDLCALC, LDLDIRECT    Wt Readings from Last 3 Encounters:  06/24/18 158 lb (71.7 kg)  05/30/18 160 lb (72.6 kg)  05/06/18 164 lb (74.4 kg)     ASSESSMENT AND PLAN:  # Paroxysmal atrial fibrillation/flutter: Ms. Faerber is having recurrent episodes of atrial fibrillation after ablation.  She is no longer on flecainide.  She is symptomatic in the atrial fibrillation and her heart rates are not well-controlled.  We cannot increase her baseline dose of metoprolol as she would be bradycardic when in sinus rhythm.   For now she wil take extra metoprolol as neededl if she goes back in atrial fibrillation.  Continue Eliquis.  Follow-up with atrial fibrillation clinic as scheduled.  They will help decide if she may benefit from an alternative antiarrhythmic.   This patients CHA2DS2-VASc Score and unadjusted Ischemic Stroke Rate (% per year) is equal to 3.2 % stroke rate/year from a score of 3  Above score calculated as 1 point each if present [CHF, HTN, DM, Vascular=MI/PAD/Aortic Plaque, Age if 65-74, or Female] Above score calculated as 2 points each if present [Age > 75, or Stroke/TIA/TE]  # Hypertension: BP is controlled on metoprolol.  # Hyperlipidemia: Continue atorvastatin.  LDL was 69 11/2016.  LDL goal <70.  She is scheduled to get lipids checked with her PCP in October.   Current medicines are reviewed at length with the patient today.  The patient does not have concerns regarding medicines.  The following changes have been made: metoprolol tartrate 12.5 mg prn   Labs/ tests ordered today include:   No  orders of the defined types were placed in this encounter.    Disposition:   FU with Raquelle Pietro C. Oval Linsey, MD, [facility] in 1 year       Signed, Renee Erb C. Oval Linsey, MD, [facility]  06/24/2018 11:06 AM    [place]

## 2018-06-26 ENCOUNTER — Encounter: Payer: Self-pay | Admitting: Cardiovascular Disease

## 2018-06-26 ENCOUNTER — Encounter (HOSPITAL_COMMUNITY): Payer: Self-pay | Admitting: Nurse Practitioner

## 2018-06-26 ENCOUNTER — Ambulatory Visit (HOSPITAL_COMMUNITY)
Admission: RE | Admit: 2018-06-26 | Discharge: 2018-06-26 | Disposition: A | Discharge status: Home / Self Care | Payer: Medicare Other | Source: Ambulatory Visit | Attending: Nurse Practitioner | Admitting: Nurse Practitioner

## 2018-06-26 VITALS — BP 106/64 | HR 85 | Ht 64.0 in | Wt 159.0 lb

## 2018-06-26 DIAGNOSIS — Z9889 Other specified postprocedural states: Secondary | ICD-10-CM | POA: Diagnosis not present

## 2018-06-26 DIAGNOSIS — I10 Essential (primary) hypertension: Secondary | ICD-10-CM | POA: Diagnosis not present

## 2018-06-26 DIAGNOSIS — E785 Hyperlipidemia, unspecified: Secondary | ICD-10-CM | POA: Diagnosis not present

## 2018-06-26 DIAGNOSIS — I480 Paroxysmal atrial fibrillation: Secondary | ICD-10-CM | POA: Diagnosis present

## 2018-06-26 DIAGNOSIS — Z885 Allergy status to narcotic agent status: Secondary | ICD-10-CM | POA: Diagnosis not present

## 2018-06-26 DIAGNOSIS — E119 Type 2 diabetes mellitus without complications: Secondary | ICD-10-CM | POA: Diagnosis not present

## 2018-06-26 DIAGNOSIS — Z7901 Long term (current) use of anticoagulants: Secondary | ICD-10-CM | POA: Insufficient documentation

## 2018-06-26 DIAGNOSIS — Z888 Allergy status to other drugs, medicaments and biological substances status: Secondary | ICD-10-CM | POA: Diagnosis not present

## 2018-06-26 NOTE — Progress Notes (Signed)
Primary Care Physician: Berkley Harvey, NP Referring Physician: Dr. Abran Cantor Mcbreen is a 72 y.o. female with a h/o paroxysmal afib  that is in the afib clinic for f/u of ablation 9/22. She has only noted a few episodes of afib that lasted less than 10 mins. No swallowing or groin issues.   Today, she denies symptoms of palpitations, chest pain, shortness of breath, orthopnea, PND, lower extremity edema, dizziness, presyncope, syncope, or neurologic sequela. The patient is tolerating medications without difficulties and is otherwise without complaint today.   Past Medical History:  Diagnosis Date  . Diabetes mellitus without complication ([place])   . Essential hypertension 04/25/2016  . Hyperlipidemia 04/25/2016  . Hypertension   . Paroxysmal atrial fibrillation ([place]) 04/25/2016  . PONV (postoperative nausea and vomiting)   . Typical atrial flutter ([place]) 04/25/2016   Past Surgical History:  Procedure Laterality Date  . ATRIAL FIBRILLATION ABLATION N/A 05/30/2018   Procedure: ATRIAL FIBRILLATION ABLATION;  Surgeon: Thompson Grayer, MD;  Location: [facility];  Service: Cardiovascular;  Laterality: N/A;  . CARDIOVERSION N/A 02/05/2018   Procedure: CARDIOVERSION;  Surgeon: Josue Hector, MD;  Location: [facility] ENDOSCOPY;  Service: Cardiovascular;  Laterality: N/A;    Current Outpatient Medications  Medication Sig Dispense Refill  . atorvastatin (LIPITOR) 20 MG tablet Take 1 tablet (20 mg total) by mouth daily at 6 PM. 90 tablet 3  . Blood Glucose Monitoring Suppl (GHT BLOOD GLUCOSE MONITOR) w/Device KIT Apply 1 strip topically as directed.    . Calcium Carb-Cholecalciferol (CALCIUM+D3) 600-800 MG-UNIT TABS Take 2 tablets by mouth 2 (two) times daily.     Marland Kitchen ELIQUIS 5 MG TABS tablet TAKE ONE TABLET BY MOUTH TWICE DAILY 180 tablet 1  . metoprolol tartrate (LOPRESSOR) 25 MG tablet Take 12.5 mg by mouth 2 (two) times daily.    . Multiple Vitamins-Minerals (WOMENS MULTIVITAMIN PLUS  PO) Take 1 tablet by mouth daily.    . ReliOn Ultra Thin Lancets MISC Apply 1 strip topically as directed.     No current facility-administered medications for this encounter.     Allergies  Allergen Reactions  . Codeine Nausea And Vomiting  . Prednisone Other (See Comments)    Made patient overly jittery, Can tolerate in low doses for short periods of time    Social History   Socioeconomic History  . Marital status: Married    Spouse name: Not on file  . Number of children: Not on file  . Years of education: Not on file  . Highest education level: Not on file  Occupational History  . Not on file  Social Needs  . Financial resource strain: Not on file  . Food insecurity:    Worry: Not on file    Inability: Not on file  . Transportation needs:    Medical: Not on file    Non-medical: Not on file  Tobacco Use  . Smoking status: Never Smoker  . Smokeless tobacco: Never Used  Substance and Sexual Activity  . Alcohol use: Yes    Comment: occasional social  . Drug use: Never  . Sexual activity: Not on file  Lifestyle  . Physical activity:    Days per week: Not on file    Minutes per session: Not on file  . Stress: Not on file  Relationships  . Social connections:    Talks on phone: Not on file    Gets together: Not on file    Attends religious  service: Not on file    Active member of club or organization: Not on file    Attends meetings of clubs or organizations: Not on file    Relationship status: Not on file  . Intimate partner violence:    Fear of current or ex partner: Not on file    Emotionally abused: Not on file    Physically abused: Not on file    Forced sexual activity: Not on file  Other Topics Concern  . Not on file  Social History Narrative   Lives with spouse   Retired Surveyor, [employer]    Family History  Problem Relation Age of Onset  . Cancer Father     ROS- All systems are reviewed and negative except as per the HPI above  Physical  Exam: Vitals:   06/26/18 1014  BP: 106/64  Pulse: 85  Weight: 72.1 kg  Height: '5\' 4"'$  (1.626 m)   Wt Readings from Last 3 Encounters:  06/26/18 72.1 kg  06/24/18 71.7 kg  05/30/18 72.6 kg    Labs: Lab Results  Component Value Date   NA 143 05/06/2018   K 4.8 05/06/2018   CL 104 05/06/2018   CO2 25 05/06/2018   GLUCOSE 93 05/06/2018   BUN 18 05/06/2018   CREATININE 0.86 05/06/2018   CALCIUM 9.8 05/06/2018   MG 2.2 01/27/2018   No results found for: INR No results found for: CHOL, HDL, LDLCALC, TRIG   GEN- The patient is well appearing, alert and oriented x 3 today.   Head- normocephalic, atraumatic Eyes-  Sclera clear, conjunctiva pink Ears- hearing intact Oropharynx- clear Neck- supple, no JVP Lymph- no cervical lymphadenopathy Lungs- Clear to ausculation bilaterally, normal work of breathing Heart- Regular rate and rhythm, no murmurs, rubs or gallops, PMI not laterally displaced GI- soft, NT, ND, + BS Extremities- no clubbing, cyanosis, or edema MS- no significant deformity or atrophy Skin- no rash or lesion Psych- euthymic mood, full affect Neuro- strength and sensation are intact  EKG-NSR at 85 bpm, Pr int 150 ms, qrs int 76 ms, qtc 464 ms     Assessment and Plan: 1. Afib  S/p ablation She is doing well, very little afib burden No swallowing or groin issues  Knows not to interrupt anticoagulation for the 3 months following ablation Eliquis 5 mg bid for CHA2DS2VASc score of at least 4  F/u with Dr. Rayann Heman 11/25  Geroge Baseman. Rehmat Murtagh, [facility] [address] [place], [place] [address] [phone]

## 2018-07-25 ENCOUNTER — Other Ambulatory Visit: Payer: Self-pay | Admitting: Cardiovascular Disease

## 2018-09-02 ENCOUNTER — Ambulatory Visit (INDEPENDENT_AMBULATORY_CARE_PROVIDER_SITE_OTHER): Payer: Medicare Other | Admitting: Internal Medicine

## 2018-09-02 ENCOUNTER — Encounter: Payer: Self-pay | Admitting: Internal Medicine

## 2018-09-02 VITALS — BP 118/80 | HR 77 | Ht 64.5 in | Wt 160.4 lb

## 2018-09-02 DIAGNOSIS — I483 Typical atrial flutter: Secondary | ICD-10-CM

## 2018-09-02 DIAGNOSIS — I10 Essential (primary) hypertension: Secondary | ICD-10-CM

## 2018-09-02 DIAGNOSIS — I480 Paroxysmal atrial fibrillation: Secondary | ICD-10-CM

## 2018-09-02 MED ORDER — METOPROLOL TARTRATE 25 MG PO TABS: 12.5000 mg | ORAL_TABLET | Freq: Two times a day (BID) | ORAL | 3 refills | Status: DC | PRN

## 2018-09-02 NOTE — Progress Notes (Signed)
   PCP: Berkley Harvey, NP Primary Cardiologist: Dr Julieta Gutting Ann Hobbs is a 72 y.o. female who presents today for routine electrophysiology followup.  Since her recent afib ablation, the patient reports doing very well.  she denies procedure related complications and is pleased with the results of the procedure.  Today, she denies symptoms of palpitations, chest pain, shortness of breath,  lower extremity edema, dizziness, presyncope, or syncope.  The patient is otherwise without complaint today.   Past Medical History:  Diagnosis Date  . Diabetes mellitus without complication ([place])   . Essential hypertension 04/25/2016  . Hyperlipidemia 04/25/2016  . Hypertension   . Paroxysmal atrial fibrillation ([place]) 04/25/2016  . PONV (postoperative nausea and vomiting)   . Typical atrial flutter ([place]) 04/25/2016   Past Surgical History:  Procedure Laterality Date  . ATRIAL FIBRILLATION ABLATION N/A 05/30/2018   Procedure: ATRIAL FIBRILLATION ABLATION;  Surgeon: Thompson Grayer, MD;  Location: [place] [facility] LAB;  Service: Cardiovascular;  Laterality: N/A;  . CARDIOVERSION N/A 02/05/2018   Procedure: CARDIOVERSION;  Surgeon: Josue Hector, MD;  Location: [facility] ENDOSCOPY;  Service: Cardiovascular;  Laterality: N/A;    ROS- all systems are personally reviewed and negatives except as per HPI above  Current Outpatient Medications  Medication Sig Dispense Refill  . atorvastatin (LIPITOR) 20 MG tablet Take 1 tablet (20 mg total) by mouth daily at 6 PM. 90 tablet 3  . Blood Glucose Monitoring Suppl (GHT BLOOD GLUCOSE MONITOR) w/Device KIT Apply 1 strip topically as directed.    . Calcium Carb-Cholecalciferol (CALCIUM+D3) 600-800 MG-UNIT TABS Take 2 tablets by mouth 2 (two) times daily.     Marland Kitchen ELIQUIS 5 MG TABS tablet TAKE ONE (1) TABLET BY MOUTH TWO (2) TIMES DAILY 180 tablet 1  . metoprolol tartrate (LOPRESSOR) 25 MG tablet Take 12.5 mg by mouth 2 (two) times daily.    . Multiple Vitamins-Minerals  (WOMENS MULTIVITAMIN PLUS PO) Take 1 tablet by mouth daily.    . ReliOn Ultra Thin Lancets MISC Apply 1 strip topically as directed.     No current facility-administered medications for this visit.     Physical Exam: There were no vitals filed for this visit.  GEN- The patient is well appearing, alert and oriented x 3 today.   Head- normocephalic, atraumatic Eyes-  Sclera clear, conjunctiva pink Ears- hearing intact Oropharynx- clear Lungs- Clear to ausculation bilaterally, normal work of breathing Heart- Regular rate and rhythm, no murmurs, rubs or gallops, PMI not laterally displaced GI- soft, NT, ND, + BS Extremities- no clubbing, cyanosis, or edema  EKG tracing ordered today is personally reviewed and shows sinus rhythm  Assessment and Plan:  1. Paroxysmal atrial fibrillation and atrial flutter Doing well s/p ablation chads2vasc score is 4.  She is on eliquis She really wants to stop anticoagulation.  We discussed guidelines at length today.  I have advised that she continue anticoagulation with eliquis.  We could consider ILR for long term management with hopes of reducing anticoagulation burden or REACT AF trial should this become an option.  2. HTN Stable No change required today  Return to see me in 3 months  Thompson Grayer MD, [facility] 09/02/2018 11:19 AM

## 2018-09-02 NOTE — Patient Instructions (Addendum)
Medication Instructions:  Your physician has recommended you make the following change in your medication:   1.  Change how you take your metoprolol tartrate 25 mg---Take 1/2 tablet by mouth twice a day AS NEEDED.     Labwork: None ordered.  Testing/Procedures: None ordered.  Follow-Up: Your physician wants you to follow-up in: 3 months with Dr. Johney FrameAllred.      Any Other Special Instructions Will Be Listed Below (If Applicable).  If you need a refill on your cardiac medications before your next appointment, please call your pharmacy.

## 2018-09-25 ENCOUNTER — Telehealth: Payer: Self-pay | Admitting: Cardiovascular Disease

## 2018-09-25 NOTE — Telephone Encounter (Signed)
[employer] Patient Assistance packet received from patient. Routing to Dr. Duke Salviaandolph. 09/25/18 vlm

## 2018-09-30 ENCOUNTER — Telehealth: Payer: Self-pay | Admitting: *Deleted

## 2018-09-30 MED ORDER — APIXABAN 5 MG PO TABS: ORAL_TABLET | ORAL | 3 refills | Status: DC

## 2018-09-30 NOTE — Telephone Encounter (Signed)
[employer] patient assistance forms filled out for Dr Duke Salviaandolph to sign along with RX

## 2018-10-22 NOTE — Telephone Encounter (Signed)
Received a letter that patient denied patient assistance. Called [employer] [employer] to follow up and she does need resubmit pharmacy printout when she has reachead $507.91 out of pocket then they will cover Elqiuis. Advised patient, verbalized understanding. She will bring once she has met this amount

## 2018-10-23 ENCOUNTER — Other Ambulatory Visit: Payer: Self-pay | Admitting: Cardiovascular Disease

## 2018-12-02 ENCOUNTER — Ambulatory Visit (INDEPENDENT_AMBULATORY_CARE_PROVIDER_SITE_OTHER): Payer: Medicare Other | Admitting: Internal Medicine

## 2018-12-02 ENCOUNTER — Encounter: Payer: Self-pay | Admitting: Internal Medicine

## 2018-12-02 VITALS — BP 128/72 | HR 79 | Ht 64.5 in | Wt 167.8 lb

## 2018-12-02 DIAGNOSIS — I10 Essential (primary) hypertension: Secondary | ICD-10-CM | POA: Diagnosis not present

## 2018-12-02 DIAGNOSIS — I480 Paroxysmal atrial fibrillation: Secondary | ICD-10-CM

## 2018-12-02 NOTE — Patient Instructions (Addendum)

## 2018-12-02 NOTE — Progress Notes (Signed)
   PCP: Berkley Harvey, NP Primary Cardiologist:  Dr Oval Linsey Primary EP: Dr Abran Cantor Ann Hobbs is a 73 y.o. female who presents today for routine electrophysiology followup.  Since last being seen in our clinic, the patient reports doing very well. + rare palpitations  Today, she denies symptoms of chest pain, shortness of breath,  lower extremity edema, dizziness, presyncope, or syncope.  The patient is otherwise without complaint today.   Past Medical History:  Diagnosis Date  . Diabetes mellitus without complication ([place])   . Essential hypertension 04/25/2016  . Hyperlipidemia 04/25/2016  . Hypertension   . Paroxysmal atrial fibrillation ([place]) 04/25/2016  . PONV (postoperative nausea and vomiting)   . Typical atrial flutter ([place]) 04/25/2016   Past Surgical History:  Procedure Laterality Date  . ATRIAL FIBRILLATION ABLATION N/A 05/30/2018   Procedure: ATRIAL FIBRILLATION ABLATION;  Surgeon: Thompson Grayer, MD;  Location: [facility];  Service: Cardiovascular;  Laterality: N/A;  . CARDIOVERSION N/A 02/05/2018   Procedure: CARDIOVERSION;  Surgeon: Josue Hector, MD;  Location: [facility] ENDOSCOPY;  Service: Cardiovascular;  Laterality: N/A;    ROS- all systems are reviewed and negatives except as per HPI above  Current Outpatient Medications  Medication Sig Dispense Refill  . apixaban (ELIQUIS) 5 MG TABS tablet TAKE ONE (1) TABLET BY MOUTH TWO (2) TIMES DAILY 180 tablet 3  . atorvastatin (LIPITOR) 20 MG tablet TAKE ONE (1) TABLET BY MOUTH EACH DAY AT6PM 90 tablet 3  . Blood Glucose Monitoring Suppl (GHT BLOOD GLUCOSE MONITOR) w/Device KIT Apply 1 strip topically as directed.    . Calcium Carb-Cholecalciferol (CALCIUM+D3) 600-800 MG-UNIT TABS Take 2 tablets by mouth 2 (two) times daily.     . Multiple Vitamins-Minerals (WOMENS MULTIVITAMIN PLUS PO) Take 1 tablet by mouth daily.    . ReliOn Ultra Thin Lancets MISC Apply 1 strip topically as directed.    . metoprolol tartrate  (LOPRESSOR) 25 MG tablet Take 0.5 tablets (12.5 mg total) by mouth 2 (two) times daily as needed. 90 tablet 3   No current facility-administered medications for this visit.     Physical Exam: Vitals:   12/02/18 1206  BP: 128/72  Pulse: 79  SpO2: 99%  Weight: 167 lb 12.8 oz (76.1 kg)  Height: 5' 4.5" (1.638 m)    GEN- The patient is well appearing, alert and oriented x 3 today.   Head- normocephalic, atraumatic Eyes-  Sclera clear, conjunctiva pink Ears- hearing intact Oropharynx- clear Lungs- Clear to ausculation bilaterally, normal work of breathing Heart- Regular rate and rhythm, no murmurs, rubs or gallops, PMI not laterally displaced GI- soft, NT, ND, + BS Extremities- no clubbing, cyanosis, or edema  Wt Readings from Last 3 Encounters:  12/02/18 167 lb 12.8 oz (76.1 kg)  09/02/18 160 lb 6.4 oz (72.8 kg)  06/26/18 159 lb (72.1 kg)    EKG tracing ordered today is personally reviewed and shows sinus rhythm 79 bpm  Assessment and Plan:  1. Paroxysmal atrial fibrillation and atrial flutter Doing very well post ablation off AAD therapy On eliquis for chads2vasc score of 4. I have reviewed all of her tracings on her [employer] today and do not see any afib.  She is worried about costs of eliquis. We will screen her for REACT AF if this study becomes available to [place].  2. HTN Stable No change required today  Return in 6 months  Thompson Grayer MD, [facility] 12/02/2018 12:11 PM

## 2018-12-09 ENCOUNTER — Telehealth: Payer: Self-pay | Admitting: Cardiovascular Disease

## 2018-12-09 NOTE — Telephone Encounter (Signed)
SPOKE TO patient she states she has reached the $500 needed of medication purchase , she will be faxing information  So that it can added to the patient assistance form for eliquis   RN GAVE PATIENT FAX Number - attn to Juliette Alcide  will contact patient to let her know when information has arrived. She verbalized understanding

## 2018-12-09 NOTE — Telephone Encounter (Signed)
New Message   Patient states she has the information for financial aid from Ann Hobbs to get her Eliquis and wants to make sure the nurse gets the information faxed over to [employer].

## 2018-12-10 ENCOUNTER — Ambulatory Visit (INDEPENDENT_AMBULATORY_CARE_PROVIDER_SITE_OTHER): Payer: Self-pay | Admitting: Orthopaedic Surgery

## 2018-12-10 NOTE — Telephone Encounter (Signed)
Received information from patient

## 2018-12-10 NOTE — Telephone Encounter (Signed)
Spoke with patients wife yesterday and advised unable to find information but would follow up with medical records this am. Did receive from [employer] this am, left message to call back.

## 2018-12-11 ENCOUNTER — Ambulatory Visit (INDEPENDENT_AMBULATORY_CARE_PROVIDER_SITE_OTHER): Payer: Medicare Other | Admitting: Orthopaedic Surgery

## 2018-12-11 ENCOUNTER — Ambulatory Visit (INDEPENDENT_AMBULATORY_CARE_PROVIDER_SITE_OTHER): Payer: Medicare Other

## 2018-12-11 ENCOUNTER — Encounter (INDEPENDENT_AMBULATORY_CARE_PROVIDER_SITE_OTHER): Payer: Self-pay | Admitting: Orthopaedic Surgery

## 2018-12-11 DIAGNOSIS — G8929 Other chronic pain: Secondary | ICD-10-CM | POA: Diagnosis not present

## 2018-12-11 DIAGNOSIS — M25561 Pain in right knee: Secondary | ICD-10-CM | POA: Diagnosis not present

## 2018-12-11 DIAGNOSIS — M1712 Unilateral primary osteoarthritis, left knee: Secondary | ICD-10-CM | POA: Diagnosis not present

## 2018-12-11 DIAGNOSIS — M25562 Pain in left knee: Secondary | ICD-10-CM | POA: Diagnosis not present

## 2018-12-11 DIAGNOSIS — M1711 Unilateral primary osteoarthritis, right knee: Secondary | ICD-10-CM | POA: Diagnosis not present

## 2018-12-11 MED ORDER — METHYLPREDNISOLONE ACETATE 40 MG/ML IJ SUSP
40.0000 mg | INTRAMUSCULAR | Status: AC | PRN
  Administered 2018-12-11: 40 mg via INTRA_ARTICULAR

## 2018-12-11 MED ORDER — LIDOCAINE HCL 1 % IJ SOLN
3.0000 mL | INTRAMUSCULAR | Status: AC | PRN
  Administered 2018-12-11: 3 mL

## 2018-12-11 NOTE — Telephone Encounter (Signed)
Follow up   Patient is returning your call. Patient states that the information that was previously talked about has been  faxed to [facility] # [phone] on yesterday morning at 8:30am. Please call with any questions.

## 2018-12-11 NOTE — Telephone Encounter (Signed)
Information for Patient Assistance has been faxed to [employer]

## 2018-12-11 NOTE — Progress Notes (Signed)
Office Visit Note   Patient: Ann Hobbs           Date of Birth: [birth year]           MRN: [id number] Visit Date: 12/11/2018              Requested by: Iona Hansen, NP [address] [place], [place] [address] PCP: Iona Hansen, NP   Assessment & Plan: Visit Diagnoses:  1. Chronic pain of right knee   2. Unilateral primary osteoarthritis, right knee   3. Chronic pain of left knee   4. Unilateral primary osteoarthritis, left knee     Plan: I feel that she would benefit from continued weight loss and activity modification as well as a steroid injection in both knees followed by hyaluronic acid in both knees in a month from now.  I explained in detail the risk and benefits of these types of injections as well as the rationale behind these types of injections.  At being 73 years old very active she does wish to try to stay active and hopefully injections will help her.  She tolerated steroid injections well.  We will see her back in a month for hyaluronic acid injections hopefully for both knees.  Follow-Up Instructions: Return in about 4 weeks (around 01/08/2019).   Orders:  Orders Placed This Encounter  Procedures  . Large Joint Inj  . XR Knee 1-2 Views Right   No orders of the defined types were placed in this encounter.     Procedures: Large Joint Inj: L knee on 12/11/2018 2:26 PM Indications: diagnostic evaluation and pain Details: 22 G 1.5 in needle, superolateral approach  Arthrogram: No  Medications: 3 mL lidocaine 1 %; 40 mg methylPREDNISolone acetate 40 MG/ML Outcome: tolerated well, no immediate complications Procedure, treatment alternatives, risks and benefits explained, specific risks discussed. Consent was given by the patient. Immediately prior to procedure a time out was called to verify the correct patient, procedure, equipment, support staff and site/side marked as required. Patient was prepped and draped in the usual sterile fashion.        Clinical Data: No additional findings.   Subjective: Chief Complaint  Patient presents with  . Right Knee - Pain  The patient is someone I am seeing for the first time as a patient but have seen her husband before.  She has a history of bilateral knee pain and actually had arthroscopic intervention 5 years ago on the right knee where they performed a partial medial meniscectomy.  She said it took her several years to get over that with abundant swelling in her knee.  She denies any locking catching in either knee but she does say when she is been sitting for any period of time and gets up to walk her knees are very stiff and it takes a while to walk them off.  They do swell on occasion.  He mainly hurts on the medial joint line with both knees and they do pop on occasion but there is no locking and catching.  She is worked on activity modification and is working on weight loss.  We talked about quad strengthening exercises as well.  HPI  Review of Systems She currently denies any headache, chest pain, shortness of breath, fever, chills, nausea, vomiting  Objective: Vital Signs: There were no vitals taken for this visit.  Physical Exam She is alert and orient x3 and in no acute distress Ortho  Exam Examination of both knees shows medial joint line tenderness with no effusion.  Both knees have full range of motion and are ligamentously stable.  Both knees have patellofemoral crepitation Specialty Comments:  No specialty comments available.  Imaging: Xr Knee 1-2 Views Right  Result Date: 12/11/2018 An AP and lateral the right knee with a standing x-ray shows the AP of both knees.  Both knees have slight varus malalignment with just slight medial joint space narrowing and a small amount of particular osteophytes.  The lateral view of the right knee show some patellofemoral arthritic changes.  There is otherwise no acute findings and no evidence of fracture.    PMFS  History: Patient Active Problem List   Diagnosis Date Noted  . Paroxysmal atrial fibrillation (HCC) 05/30/2018  . Positive colorectal cancer screening using DNA-based stool test 12/19/2016  . Low bone mass 12/04/2016  . Atrial fibrillation (HCC) 04/25/2016  . Atrial flutter (HCC) 04/25/2016  . Hyperlipidemia 04/25/2016  . History of diabetes mellitus 05/20/2015  . Mixed hyperlipidemia 05/20/2015  . Essential hypertension 02/17/2015  . Palpitations 10/17/2013  . Heart murmur 10/15/2013  . 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/15/2013   Past Medical History:  Diagnosis Date  . Diabetes mellitus without complication (HCC)   . Essential hypertension 04/25/2016  . Hyperlipidemia 04/25/2016  . Hypertension   . Paroxysmal atrial fibrillation (HCC) 04/25/2016  . PONV (postoperative nausea and vomiting)   . Typical atrial flutter (HCC) 04/25/2016    Family History  Problem Relation Age of Onset  . Cancer Father     Past Surgical History:  Procedure Laterality Date  . ATRIAL FIBRILLATION ABLATION N/A 05/30/2018   Procedure: ATRIAL FIBRILLATION ABLATION;  Surgeon: Hillis Range, MD;  Location: [facility] LAB;  Service: Cardiovascular;  Laterality: N/A;  . CARDIOVERSION N/A 02/05/2018   Procedure: CARDIOVERSION;  Surgeon: Wendall Stade, MD;  Location: [facility] ENDOSCOPY;  Service: Cardiovascular;  Laterality: N/A;   Social History   Occupational History  . Not on file  Tobacco Use  . Smoking status: Never Smoker  . Smokeless tobacco: Never Used  Substance and Sexual Activity  . Alcohol use: Yes    Comment: occasional social  . Drug use: Never  . Sexual activity: Not on file

## 2018-12-11 NOTE — Telephone Encounter (Signed)
Bilateral knee gel injections 

## 2018-12-12 NOTE — Telephone Encounter (Signed)
Noted  

## 2018-12-27 ENCOUNTER — Telehealth (INDEPENDENT_AMBULATORY_CARE_PROVIDER_SITE_OTHER): Payer: Self-pay

## 2018-12-27 NOTE — Telephone Encounter (Signed)
Submitted VOB for SynviscOne, bilateral knee. 

## 2018-12-31 ENCOUNTER — Telehealth (INDEPENDENT_AMBULATORY_CARE_PROVIDER_SITE_OTHER): Payer: Self-pay

## 2018-12-31 NOTE — Telephone Encounter (Signed)
Patient is approved for SynviscOne, bilateral knee. Buy & Bill Covered at 100% through secondary insurance [employer]) after [employer] pays. No Co-pay No PA required  Appt. 01/08/2019

## 2019-01-07 ENCOUNTER — Telehealth (INDEPENDENT_AMBULATORY_CARE_PROVIDER_SITE_OTHER): Payer: Self-pay | Admitting: Radiology

## 2019-01-07 NOTE — Telephone Encounter (Signed)
LMOM to return call for screening questions. Please ask with return call, thanks!  Do you have now or have you had in the past 7 days a fever and/or chills? Do you have now or have you had in the past 7 days a cough? Do you have now or have you had in the last 7 days nausea, vomiting or abdominal pain? Have you been exposed to anyone who has tested positive for COVID-19? Have you or anyone who lives with you traveled within the last month? 

## 2019-01-08 ENCOUNTER — Ambulatory Visit (INDEPENDENT_AMBULATORY_CARE_PROVIDER_SITE_OTHER): Payer: Medicare Other | Admitting: Orthopaedic Surgery

## 2019-02-06 ENCOUNTER — Other Ambulatory Visit: Payer: Self-pay | Admitting: Cardiovascular Disease

## 2019-02-06 NOTE — Telephone Encounter (Signed)
Pt last saw Dr Johney Frame 12/02/18, last labs 05/06/18 Creat 0.86, age 73, weight 76.1kg, based on specified criteria pt is on appropriate dosage of Eliquis 5mg  BID.  Will refill rx.

## 2019-03-06 NOTE — Telephone Encounter (Signed)
Patient is returning call.  °

## 2019-03-06 NOTE — Telephone Encounter (Signed)
Spoke with patient, she will get printout from pharmacy and fax to [facility].

## 2019-03-06 NOTE — Telephone Encounter (Signed)
Called Ann Hobbs to follow up on application since nothing received from them. Patient is just un $3.00 short of meeting out of pocket and will need to submit before approved. Left message to call back

## 2019-03-18 NOTE — Telephone Encounter (Signed)
Spoke with Ann Hobbs to follow up. Patient approved 5/29 through end of year however no medication mailed to patient yet. Was advised to have patient call [phone] option 6. Advised patient and she will call

## 2019-03-31 ENCOUNTER — Encounter: Payer: Self-pay | Admitting: Orthopaedic Surgery

## 2019-03-31 ENCOUNTER — Ambulatory Visit (INDEPENDENT_AMBULATORY_CARE_PROVIDER_SITE_OTHER): Payer: Medicare Other | Admitting: Orthopaedic Surgery

## 2019-03-31 ENCOUNTER — Other Ambulatory Visit: Payer: Self-pay

## 2019-03-31 DIAGNOSIS — M1711 Unilateral primary osteoarthritis, right knee: Secondary | ICD-10-CM | POA: Diagnosis not present

## 2019-03-31 DIAGNOSIS — M1712 Unilateral primary osteoarthritis, left knee: Secondary | ICD-10-CM | POA: Diagnosis not present

## 2019-03-31 MED ORDER — HYLAN G-F 20 48 MG/6ML IX SOSY
48.0000 mg | PREFILLED_SYRINGE | INTRA_ARTICULAR | Status: AC | PRN
  Administered 2019-03-31: 48 mg via INTRA_ARTICULAR

## 2019-03-31 MED ORDER — LIDOCAINE HCL 1 % IJ SOLN
0.5000 mL | INTRAMUSCULAR | Status: AC | PRN
  Administered 2019-03-31: .5 mL

## 2019-03-31 NOTE — Progress Notes (Signed)
   Procedure Note  Patient: Ann Hobbs             Date of Birth: Aug 12, 1946           MRN: [id number]             Visit Date: 03/31/2019 HPI: Ann Hobbs comes in today for injections in both knees.  She is here for Synvisc 1 injections.  States over the last few weeks she has had really bad pain in both knees.  She had cortisone injections in both knees back in March.  She is had no new injury to the knees.  Physical exam: Bilateral knees no abnormal warmth erythema or effusion.  Good range of motion of both knees.  Procedures: Visit Diagnoses:  1. Unilateral primary osteoarthritis, left knee   2. Unilateral primary osteoarthritis, right knee     Large Joint Inj: bilateral knee on 03/31/2019 4:52 PM Indications: pain Details: 22 G 1.5 in needle, superolateral approach  Arthrogram: No  Medications (Right): 0.5 mL lidocaine 1 %; 48 mg Hylan 48 MG/6ML Medications (Left): 0.5 mL lidocaine 1 %; 48 mg Hylan 48 MG/6ML Outcome: tolerated well, no immediate complications Procedure, treatment alternatives, risks and benefits explained, specific risks discussed. Consent was given by the patient. Immediately prior to procedure a time out was called to verify the correct patient, procedure, equipment, support staff and site/side marked as required. Patient was prepped and draped in the usual sterile fashion.     Plan: She will follow-up with [place] on an as-needed basis.  She understands that she needs to wait 3 months between cortisone injections in 6 months between supplemental injections.

## 2019-05-30 ENCOUNTER — Telehealth: Payer: Self-pay

## 2019-06-04 ENCOUNTER — Telehealth: Payer: Medicare Other | Admitting: Internal Medicine

## 2019-06-05 ENCOUNTER — Encounter: Payer: Self-pay | Admitting: Internal Medicine

## 2019-06-05 ENCOUNTER — Telehealth (INDEPENDENT_AMBULATORY_CARE_PROVIDER_SITE_OTHER): Payer: Medicare Other | Admitting: Internal Medicine

## 2019-06-05 VITALS — BP 123/81 | HR 81 | Ht 64.5 in | Wt 168.0 lb

## 2019-06-05 DIAGNOSIS — I480 Paroxysmal atrial fibrillation: Secondary | ICD-10-CM

## 2019-06-05 DIAGNOSIS — I10 Essential (primary) hypertension: Secondary | ICD-10-CM | POA: Diagnosis not present

## 2019-06-05 NOTE — Progress Notes (Signed)
Electrophysiology TeleHealth Note   Due to national recommendations of social distancing due to COVID 19, an audio/video telehealth visit is felt to be most appropriate for this patient at this time.  See MyChart message from today for the patient's consent to telehealth for [facility].   Date:  06/05/2019   ID:  Ann Hobbs, DOB 10/30/1945, MRN [id number]  Location: patient's home  Provider location:  [facility]  Evaluation Performed: Follow-up visit  PCP:  Berkley Harvey, NP   Electrophysiologist:  Dr Rayann Heman  Chief Complaint:  palpitations  History of Present Illness:    Ann Hobbs is a 73 y.o. female who presents via telehealth conferencing today.  Since last being seen in our clinic, the patient reports doing very well.  AF is well controlled. Today, she denies symptoms of palpitations, chest pain, shortness of breath,  lower extremity edema, dizziness, presyncope, or syncope.  The patient is otherwise without complaint today.  The patient denies symptoms of fevers, chills, cough, or new SOB worrisome for COVID 19.  Past Medical History:  Diagnosis Date  . Diabetes mellitus without complication ([place])   . Essential hypertension 04/25/2016  . Hyperlipidemia 04/25/2016  . Hypertension   . Paroxysmal atrial fibrillation ([place]) 04/25/2016  . PONV (postoperative nausea and vomiting)   . Typical atrial flutter ([place]) 04/25/2016    Past Surgical History:  Procedure Laterality Date  . ATRIAL FIBRILLATION ABLATION N/A 05/30/2018   Procedure: ATRIAL FIBRILLATION ABLATION;  Surgeon: Thompson Grayer, MD;  Location: [place] CV LAB;  Service: Cardiovascular;  Laterality: N/A;  . CARDIOVERSION N/A 02/05/2018   Procedure: CARDIOVERSION;  Surgeon: Josue Hector, MD;  Location: [facility] ENDOSCOPY;  Service: Cardiovascular;  Laterality: N/A;    Current Outpatient Medications  Medication Sig Dispense Refill  . atorvastatin (LIPITOR) 20 MG tablet TAKE ONE (1) TABLET BY  MOUTH EACH DAY AT6PM 90 tablet 3  . Blood Glucose Monitoring Suppl (GHT BLOOD GLUCOSE MONITOR) w/Device KIT Apply 1 strip topically as directed.    . Calcium Carb-Cholecalciferol (CALCIUM+D3) 600-800 MG-UNIT TABS Take 2 tablets by mouth 2 (two) times daily.     Marland Kitchen ELIQUIS 5 MG TABS tablet TAKE ONE TABLET BY MOUTH TWICE DAILY 180 tablet 1  . Multiple Vitamins-Minerals (WOMENS MULTIVITAMIN PLUS PO) Take 1 tablet by mouth daily.    . ReliOn Ultra Thin Lancets MISC Apply 1 strip topically as directed.     No current facility-administered medications for this visit.     Allergies:   Codeine and Prednisone   Social History:  The patient  reports that she has never smoked. She has never used smokeless tobacco. She reports current alcohol use. She reports that she does not use drugs.   Family History:  The patient's family history includes Cancer in her father.   ROS:  Please see the history of present illness.   All other systems are personally reviewed and negative.    Exam:    Vital Signs:  BP 123/81   Pulse 81   Ht 5' 4.5" (1.638 m)   Wt 168 lb (76.2 kg)   BMI 28.39 kg/m   Well sounding and appearing, alert and conversant, regular work of breathing,  good skin color Eyes- anicteric, neuro- grossly intact, skin- no apparent rash or lesions or cyanosis, mouth- oral mucosa is pink  Labs/Other Tests and Data Reviewed:    Recent Labs: No results found for requested labs within last 8760 hours.   Wt Readings from  Last 3 Encounters:  06/05/19 168 lb (76.2 kg)  12/02/18 167 lb 12.8 oz (76.1 kg)  09/02/18 160 lb 6.4 oz (72.8 kg)     ASSESSMENT & PLAN:    1.  Paroxysmal atrial fibrillation/ atrial flutter Doing great post ablation off AAD therapy She is on eliquis She has a [employer] which she uses to check her rhythm from time to time.  2. HTN Stable No change required today  Follow-up:  6 months with AF clinic   Patient Risk:  after full review of this patients clinical  status, I feel that they are at moderate risk at this time.  Today, I have spent 15 minutes with the patient with telehealth technology discussing arrhythmia management .    Army Fossa, MD  06/05/2019 9:57 AM     [place] [address] [place] [id number] [phone] (office) [phone] (fax)

## 2019-09-29 ENCOUNTER — Other Ambulatory Visit: Payer: Self-pay

## 2019-09-29 ENCOUNTER — Ambulatory Visit: Payer: Medicare Other | Attending: Nurse Practitioner | Admitting: Physical Therapy

## 2019-09-29 ENCOUNTER — Encounter: Payer: Self-pay | Admitting: Physical Therapy

## 2019-09-29 DIAGNOSIS — M25521 Pain in right elbow: Secondary | ICD-10-CM

## 2019-09-29 DIAGNOSIS — M25621 Stiffness of right elbow, not elsewhere classified: Secondary | ICD-10-CM | POA: Insufficient documentation

## 2019-09-29 DIAGNOSIS — R29898 Other symptoms and signs involving the musculoskeletal system: Secondary | ICD-10-CM | POA: Diagnosis present

## 2019-09-29 DIAGNOSIS — M25511 Pain in right shoulder: Secondary | ICD-10-CM | POA: Insufficient documentation

## 2019-09-29 DIAGNOSIS — M25611 Stiffness of right shoulder, not elsewhere classified: Secondary | ICD-10-CM | POA: Diagnosis present
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
## 2019-10-01 ENCOUNTER — Ambulatory Visit: Payer: Medicare Other | Admitting: Physician Assistant

## 2019-10-01 ENCOUNTER — Ambulatory Visit: Payer: Medicare Other | Admitting: Orthopaedic Surgery

## 2019-10-08 ENCOUNTER — Encounter: Payer: Self-pay | Admitting: Physical Therapy

## 2019-10-08 ENCOUNTER — Ambulatory Visit: Payer: Medicare Other | Admitting: Physical Therapy

## 2019-10-08 ENCOUNTER — Other Ambulatory Visit: Payer: Self-pay

## 2019-10-08 DIAGNOSIS — R29898 Other symptoms and signs involving the musculoskeletal system: Secondary | ICD-10-CM

## 2019-10-08 DIAGNOSIS — M25621 Stiffness of right elbow, not elsewhere classified: Secondary | ICD-10-CM

## 2019-10-08 DIAGNOSIS — M25521 Pain in right elbow: Secondary | ICD-10-CM | POA: Diagnosis not present

## 2019-10-08 DIAGNOSIS — M25511 Pain in right shoulder: Secondary | ICD-10-CM

## 2019-10-08 DIAGNOSIS — M25611 Stiffness of right shoulder, not elsewhere classified: Secondary | ICD-10-CM
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
## 2019-10-13 ENCOUNTER — Ambulatory Visit: Payer: Medicare Other | Admitting: Physical Therapy

## 2019-10-13 ENCOUNTER — Encounter: Payer: Self-pay | Admitting: Physical Therapy

## 2019-10-13 ENCOUNTER — Ambulatory Visit: Payer: Medicare Other | Attending: Nurse Practitioner | Admitting: Physical Therapy

## 2019-10-13 ENCOUNTER — Other Ambulatory Visit: Payer: Self-pay

## 2019-10-13 DIAGNOSIS — M25611 Stiffness of right shoulder, not elsewhere classified: Secondary | ICD-10-CM | POA: Insufficient documentation

## 2019-10-13 DIAGNOSIS — R29898 Other symptoms and signs involving the musculoskeletal system: Secondary | ICD-10-CM | POA: Insufficient documentation

## 2019-10-13 DIAGNOSIS — M25621 Stiffness of right elbow, not elsewhere classified: Secondary | ICD-10-CM

## 2019-10-13 DIAGNOSIS — M25631 Stiffness of right wrist, not elsewhere classified: Secondary | ICD-10-CM | POA: Diagnosis present

## 2019-10-13 DIAGNOSIS — M25511 Pain in right shoulder: Secondary | ICD-10-CM

## 2019-10-13 DIAGNOSIS — M25521 Pain in right elbow: Secondary | ICD-10-CM | POA: Diagnosis present
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
## 2019-10-15 ENCOUNTER — Ambulatory Visit: Payer: Medicare Other

## 2019-10-15 ENCOUNTER — Other Ambulatory Visit: Payer: Self-pay

## 2019-10-15 DIAGNOSIS — M25521 Pain in right elbow: Secondary | ICD-10-CM | POA: Diagnosis not present

## 2019-10-15 DIAGNOSIS — M25621 Stiffness of right elbow, not elsewhere classified: Secondary | ICD-10-CM

## 2019-10-15 DIAGNOSIS — M25611 Stiffness of right shoulder, not elsewhere classified: Secondary | ICD-10-CM

## 2019-10-15 DIAGNOSIS — M25511 Pain in right shoulder: Secondary | ICD-10-CM

## 2019-10-15 DIAGNOSIS — R29898 Other symptoms and signs involving the musculoskeletal system: Secondary | ICD-10-CM
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
## 2019-10-20 ENCOUNTER — Other Ambulatory Visit: Payer: Self-pay

## 2019-10-20 ENCOUNTER — Ambulatory Visit: Payer: Medicare Other | Admitting: Physical Therapy

## 2019-10-20 ENCOUNTER — Encounter: Payer: Self-pay | Admitting: Physical Therapy

## 2019-10-20 DIAGNOSIS — M25621 Stiffness of right elbow, not elsewhere classified: Secondary | ICD-10-CM

## 2019-10-20 DIAGNOSIS — M25611 Stiffness of right shoulder, not elsewhere classified: Secondary | ICD-10-CM

## 2019-10-20 DIAGNOSIS — M25521 Pain in right elbow: Secondary | ICD-10-CM

## 2019-10-20 DIAGNOSIS — M25511 Pain in right shoulder: Secondary | ICD-10-CM

## 2019-10-20 DIAGNOSIS — R29898 Other symptoms and signs involving the musculoskeletal system: Secondary | ICD-10-CM

## 2019-10-20 NOTE — Patient Instructions (Addendum)
   Kinesiology tape  What is kinesiology tape?  There are many brands of kinesiology tape. KTape, Rock Tape, Body Sport, Dynamic tape, to name a few.  It is an elasticized tape designed to support the body's natural healing process. This tape provides stability and support to muscles and joints without restricting motion.  It can also help decrease swelling in the area of application.  How does it work?  The tape microscopically lifts and decompresses the skin to allow for drainage of lymph (swelling) to flow away from area, reducing inflammation. The tape has the ability to help re-educate the neuromuscular system by targeting specific receptors in the skin. The presence of the tape increases the body's awareness of posture and body mechanics.  Do not use with:  . Open wounds . Skin lesions . Adhesive allergies  In some rare cases, mild/moderate skin irritation can occur. This can include redness, itchiness, or hives. If this occurs, immediately remove tape and consult your primary care physician if symptoms are severe or do not resolve within 2 days.  Safe removal of the tape:  To remove tape safely, hold nearby skin with one hand and gentle roll tape down with other hand. You can apply oil or conditioner to tape while in shower prior to removal to loosen adhesive. DO NOT swiftly rip tape off like a band-aid, as this could cause skin tears and additional skin irritation.     For questions, please contact your therapist at:  [facility] [place] [address]  [address] [place], [place], [address] Phone: [phone]   Fax:  [phone]     
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
## 2019-10-22 ENCOUNTER — Ambulatory Visit: Payer: Medicare Other

## 2019-10-22 ENCOUNTER — Other Ambulatory Visit: Payer: Self-pay

## 2019-10-22 DIAGNOSIS — M25621 Stiffness of right elbow, not elsewhere classified: Secondary | ICD-10-CM

## 2019-10-22 DIAGNOSIS — M25511 Pain in right shoulder: Secondary | ICD-10-CM

## 2019-10-22 DIAGNOSIS — M25521 Pain in right elbow: Secondary | ICD-10-CM

## 2019-10-22 DIAGNOSIS — R29898 Other symptoms and signs involving the musculoskeletal system: Secondary | ICD-10-CM

## 2019-10-22 DIAGNOSIS — M25611 Stiffness of right shoulder, not elsewhere classified: Secondary | ICD-10-CM
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
## 2019-10-27 ENCOUNTER — Other Ambulatory Visit: Payer: Self-pay

## 2019-10-27 ENCOUNTER — Ambulatory Visit: Payer: Medicare Other

## 2019-10-27 DIAGNOSIS — M25621 Stiffness of right elbow, not elsewhere classified: Secondary | ICD-10-CM

## 2019-10-27 DIAGNOSIS — M25521 Pain in right elbow: Secondary | ICD-10-CM

## 2019-10-27 DIAGNOSIS — M25511 Pain in right shoulder: Secondary | ICD-10-CM

## 2019-10-27 DIAGNOSIS — M25611 Stiffness of right shoulder, not elsewhere classified: Secondary | ICD-10-CM

## 2019-10-27 DIAGNOSIS — R29898 Other symptoms and signs involving the musculoskeletal system: Secondary | ICD-10-CM
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
## 2019-10-29 ENCOUNTER — Encounter: Payer: Self-pay | Admitting: Physical Therapy

## 2019-10-29 ENCOUNTER — Ambulatory Visit: Payer: Medicare Other | Admitting: Physical Therapy

## 2019-10-29 ENCOUNTER — Other Ambulatory Visit: Payer: Self-pay

## 2019-10-29 DIAGNOSIS — M25511 Pain in right shoulder: Secondary | ICD-10-CM

## 2019-10-29 DIAGNOSIS — M25521 Pain in right elbow: Secondary | ICD-10-CM | POA: Diagnosis not present

## 2019-10-29 DIAGNOSIS — R29898 Other symptoms and signs involving the musculoskeletal system: Secondary | ICD-10-CM

## 2019-10-29 DIAGNOSIS — M25611 Stiffness of right shoulder, not elsewhere classified: Secondary | ICD-10-CM

## 2019-10-29 DIAGNOSIS — M25621 Stiffness of right elbow, not elsewhere classified: Secondary | ICD-10-CM
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
## 2019-10-31 ENCOUNTER — Ambulatory Visit: Payer: Medicare Other | Admitting: Physical Therapy

## 2019-11-03 ENCOUNTER — Other Ambulatory Visit: Payer: Self-pay | Admitting: Cardiovascular Disease

## 2019-11-03 ENCOUNTER — Ambulatory Visit: Payer: Medicare Other

## 2019-11-03 ENCOUNTER — Other Ambulatory Visit: Payer: Self-pay

## 2019-11-03 DIAGNOSIS — M25511 Pain in right shoulder: Secondary | ICD-10-CM

## 2019-11-03 DIAGNOSIS — R29898 Other symptoms and signs involving the musculoskeletal system: Secondary | ICD-10-CM

## 2019-11-03 DIAGNOSIS — M25521 Pain in right elbow: Secondary | ICD-10-CM

## 2019-11-03 DIAGNOSIS — M25621 Stiffness of right elbow, not elsewhere classified: Secondary | ICD-10-CM

## 2019-11-03 DIAGNOSIS — M25611 Stiffness of right shoulder, not elsewhere classified: Secondary | ICD-10-CM
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
## 2019-11-05 ENCOUNTER — Other Ambulatory Visit: Payer: Self-pay

## 2019-11-05 ENCOUNTER — Ambulatory Visit: Payer: Medicare Other | Admitting: Physical Therapy

## 2019-11-05 ENCOUNTER — Encounter: Payer: Self-pay | Admitting: Physical Therapy

## 2019-11-05 DIAGNOSIS — M25631 Stiffness of right wrist, not elsewhere classified: Secondary | ICD-10-CM

## 2019-11-05 DIAGNOSIS — M25521 Pain in right elbow: Secondary | ICD-10-CM | POA: Diagnosis not present

## 2019-11-05 DIAGNOSIS — M25621 Stiffness of right elbow, not elsewhere classified: Secondary | ICD-10-CM

## 2019-11-05 DIAGNOSIS — R29898 Other symptoms and signs involving the musculoskeletal system: Secondary | ICD-10-CM

## 2019-11-05 DIAGNOSIS — M25611 Stiffness of right shoulder, not elsewhere classified: Secondary | ICD-10-CM

## 2019-11-05 DIAGNOSIS — M25511 Pain in right shoulder: Secondary | ICD-10-CM
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
## 2019-11-10 ENCOUNTER — Encounter: Payer: Self-pay | Admitting: Physical Therapy

## 2019-11-10 ENCOUNTER — Ambulatory Visit: Payer: Medicare Other | Attending: Nurse Practitioner | Admitting: Physical Therapy

## 2019-11-10 ENCOUNTER — Other Ambulatory Visit: Payer: Self-pay

## 2019-11-10 DIAGNOSIS — M25611 Stiffness of right shoulder, not elsewhere classified: Secondary | ICD-10-CM

## 2019-11-10 DIAGNOSIS — M25521 Pain in right elbow: Secondary | ICD-10-CM | POA: Insufficient documentation

## 2019-11-10 DIAGNOSIS — R29898 Other symptoms and signs involving the musculoskeletal system: Secondary | ICD-10-CM | POA: Diagnosis present

## 2019-11-10 DIAGNOSIS — M25631 Stiffness of right wrist, not elsewhere classified: Secondary | ICD-10-CM | POA: Diagnosis present

## 2019-11-10 DIAGNOSIS — M25621 Stiffness of right elbow, not elsewhere classified: Secondary | ICD-10-CM | POA: Diagnosis present

## 2019-11-10 DIAGNOSIS — M25511 Pain in right shoulder: Secondary | ICD-10-CM | POA: Diagnosis present
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
## 2019-11-12 ENCOUNTER — Ambulatory Visit: Payer: Medicare Other

## 2019-11-12 ENCOUNTER — Other Ambulatory Visit: Payer: Self-pay

## 2019-11-12 DIAGNOSIS — M25621 Stiffness of right elbow, not elsewhere classified: Secondary | ICD-10-CM

## 2019-11-12 DIAGNOSIS — M25611 Stiffness of right shoulder, not elsewhere classified: Secondary | ICD-10-CM

## 2019-11-12 DIAGNOSIS — R29898 Other symptoms and signs involving the musculoskeletal system: Secondary | ICD-10-CM

## 2019-11-12 DIAGNOSIS — M25521 Pain in right elbow: Secondary | ICD-10-CM | POA: Diagnosis not present

## 2019-11-12 DIAGNOSIS — M25631 Stiffness of right wrist, not elsewhere classified: Secondary | ICD-10-CM

## 2019-11-12 DIAGNOSIS — M25511 Pain in right shoulder: Secondary | ICD-10-CM
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
## 2019-11-17 ENCOUNTER — Ambulatory Visit: Payer: Medicare Other

## 2019-11-17 ENCOUNTER — Other Ambulatory Visit: Payer: Self-pay

## 2019-11-17 DIAGNOSIS — M25521 Pain in right elbow: Secondary | ICD-10-CM | POA: Diagnosis not present

## 2019-11-17 DIAGNOSIS — M25631 Stiffness of right wrist, not elsewhere classified: Secondary | ICD-10-CM

## 2019-11-17 DIAGNOSIS — M25511 Pain in right shoulder: Secondary | ICD-10-CM

## 2019-11-17 DIAGNOSIS — M25611 Stiffness of right shoulder, not elsewhere classified: Secondary | ICD-10-CM

## 2019-11-17 DIAGNOSIS — M25621 Stiffness of right elbow, not elsewhere classified: Secondary | ICD-10-CM

## 2019-11-17 DIAGNOSIS — R29898 Other symptoms and signs involving the musculoskeletal system: Secondary | ICD-10-CM
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
## 2019-11-19 ENCOUNTER — Encounter: Payer: Self-pay | Admitting: Physical Therapy

## 2019-11-19 ENCOUNTER — Ambulatory Visit: Payer: Medicare Other | Admitting: Physical Therapy

## 2019-11-19 ENCOUNTER — Other Ambulatory Visit: Payer: Self-pay

## 2019-11-19 DIAGNOSIS — R29898 Other symptoms and signs involving the musculoskeletal system: Secondary | ICD-10-CM

## 2019-11-19 DIAGNOSIS — M25621 Stiffness of right elbow, not elsewhere classified: Secondary | ICD-10-CM

## 2019-11-19 DIAGNOSIS — M25521 Pain in right elbow: Secondary | ICD-10-CM | POA: Diagnosis not present

## 2019-11-19 DIAGNOSIS — M25611 Stiffness of right shoulder, not elsewhere classified: Secondary | ICD-10-CM

## 2019-11-19 DIAGNOSIS — M25511 Pain in right shoulder: Secondary | ICD-10-CM

## 2019-11-19 DIAGNOSIS — M25631 Stiffness of right wrist, not elsewhere classified: Secondary | ICD-10-CM
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
## 2019-11-24 ENCOUNTER — Ambulatory Visit: Payer: Medicare Other

## 2019-11-24 ENCOUNTER — Other Ambulatory Visit: Payer: Self-pay

## 2019-11-24 DIAGNOSIS — M25621 Stiffness of right elbow, not elsewhere classified: Secondary | ICD-10-CM

## 2019-11-24 DIAGNOSIS — M25631 Stiffness of right wrist, not elsewhere classified: Secondary | ICD-10-CM

## 2019-11-24 DIAGNOSIS — M25511 Pain in right shoulder: Secondary | ICD-10-CM

## 2019-11-24 DIAGNOSIS — M25521 Pain in right elbow: Secondary | ICD-10-CM | POA: Diagnosis not present

## 2019-11-24 DIAGNOSIS — R29898 Other symptoms and signs involving the musculoskeletal system: Secondary | ICD-10-CM

## 2019-11-24 DIAGNOSIS — M25611 Stiffness of right shoulder, not elsewhere classified: Secondary | ICD-10-CM
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
## 2019-11-26 ENCOUNTER — Other Ambulatory Visit: Payer: Self-pay

## 2019-11-26 ENCOUNTER — Encounter: Payer: Self-pay | Admitting: Physical Therapy

## 2019-11-26 ENCOUNTER — Ambulatory Visit: Payer: Medicare Other | Admitting: Physical Therapy

## 2019-11-26 DIAGNOSIS — M25521 Pain in right elbow: Secondary | ICD-10-CM | POA: Diagnosis not present

## 2019-11-26 DIAGNOSIS — M25511 Pain in right shoulder: Secondary | ICD-10-CM

## 2019-11-26 DIAGNOSIS — M25611 Stiffness of right shoulder, not elsewhere classified: Secondary | ICD-10-CM

## 2019-11-26 DIAGNOSIS — M25621 Stiffness of right elbow, not elsewhere classified: Secondary | ICD-10-CM

## 2019-11-26 DIAGNOSIS — R29898 Other symptoms and signs involving the musculoskeletal system: Secondary | ICD-10-CM

## 2019-11-26 DIAGNOSIS — M25631 Stiffness of right wrist, not elsewhere classified: Secondary | ICD-10-CM
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
## 2019-12-01 ENCOUNTER — Ambulatory Visit: Payer: Medicare Other

## 2019-12-01 ENCOUNTER — Other Ambulatory Visit: Payer: Self-pay

## 2019-12-01 DIAGNOSIS — M25511 Pain in right shoulder: Secondary | ICD-10-CM

## 2019-12-01 DIAGNOSIS — M25631 Stiffness of right wrist, not elsewhere classified: Secondary | ICD-10-CM

## 2019-12-01 DIAGNOSIS — M25521 Pain in right elbow: Secondary | ICD-10-CM

## 2019-12-01 DIAGNOSIS — M25611 Stiffness of right shoulder, not elsewhere classified: Secondary | ICD-10-CM

## 2019-12-01 DIAGNOSIS — M25621 Stiffness of right elbow, not elsewhere classified: Secondary | ICD-10-CM

## 2019-12-01 DIAGNOSIS — R29898 Other symptoms and signs involving the musculoskeletal system: Secondary | ICD-10-CM
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
## 2019-12-03 ENCOUNTER — Ambulatory Visit: Payer: Medicare Other

## 2019-12-03 ENCOUNTER — Other Ambulatory Visit: Payer: Self-pay

## 2019-12-03 DIAGNOSIS — M25631 Stiffness of right wrist, not elsewhere classified: Secondary | ICD-10-CM

## 2019-12-03 DIAGNOSIS — M25511 Pain in right shoulder: Secondary | ICD-10-CM

## 2019-12-03 DIAGNOSIS — M25521 Pain in right elbow: Secondary | ICD-10-CM | POA: Diagnosis not present

## 2019-12-03 DIAGNOSIS — M25621 Stiffness of right elbow, not elsewhere classified: Secondary | ICD-10-CM

## 2019-12-03 DIAGNOSIS — R29898 Other symptoms and signs involving the musculoskeletal system: Secondary | ICD-10-CM

## 2019-12-03 DIAGNOSIS — M25611 Stiffness of right shoulder, not elsewhere classified: Secondary | ICD-10-CM
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
## 2019-12-10 ENCOUNTER — Encounter: Payer: Self-pay | Admitting: Physical Therapy

## 2019-12-10 ENCOUNTER — Ambulatory Visit: Payer: Medicare Other | Attending: Nurse Practitioner | Admitting: Physical Therapy

## 2019-12-10 ENCOUNTER — Other Ambulatory Visit: Payer: Self-pay

## 2019-12-10 DIAGNOSIS — M25611 Stiffness of right shoulder, not elsewhere classified: Secondary | ICD-10-CM | POA: Insufficient documentation

## 2019-12-10 DIAGNOSIS — M25521 Pain in right elbow: Secondary | ICD-10-CM | POA: Diagnosis not present

## 2019-12-10 DIAGNOSIS — M25631 Stiffness of right wrist, not elsewhere classified: Secondary | ICD-10-CM | POA: Diagnosis present

## 2019-12-10 DIAGNOSIS — R29898 Other symptoms and signs involving the musculoskeletal system: Secondary | ICD-10-CM | POA: Diagnosis present

## 2019-12-10 DIAGNOSIS — M25511 Pain in right shoulder: Secondary | ICD-10-CM | POA: Insufficient documentation

## 2019-12-10 DIAGNOSIS — M25621 Stiffness of right elbow, not elsewhere classified: Secondary | ICD-10-CM | POA: Diagnosis present

## 2019-12-10 NOTE — Addendum Note (Signed)
Addended by: Anette Guarneri D on: 12/10/2019 11:25 AM   Modules accepted: Orders
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
## 2019-12-11 ENCOUNTER — Ambulatory Visit: Payer: Medicare Other

## 2019-12-11 DIAGNOSIS — M25631 Stiffness of right wrist, not elsewhere classified: Secondary | ICD-10-CM

## 2019-12-11 DIAGNOSIS — M25521 Pain in right elbow: Secondary | ICD-10-CM

## 2019-12-11 DIAGNOSIS — M25621 Stiffness of right elbow, not elsewhere classified: Secondary | ICD-10-CM

## 2019-12-11 DIAGNOSIS — R29898 Other symptoms and signs involving the musculoskeletal system: Secondary | ICD-10-CM

## 2019-12-11 DIAGNOSIS — M25611 Stiffness of right shoulder, not elsewhere classified: Secondary | ICD-10-CM

## 2019-12-11 DIAGNOSIS — M25511 Pain in right shoulder: Secondary | ICD-10-CM
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
## 2019-12-15 ENCOUNTER — Ambulatory Visit (INDEPENDENT_AMBULATORY_CARE_PROVIDER_SITE_OTHER): Payer: Medicare Other | Admitting: Family Medicine

## 2019-12-15 ENCOUNTER — Ambulatory Visit (HOSPITAL_BASED_OUTPATIENT_CLINIC_OR_DEPARTMENT_OTHER)
Admission: RE | Admit: 2019-12-15 | Discharge: 2019-12-15 | Disposition: A | Discharge status: Home / Self Care | Payer: Medicare Other | Source: Ambulatory Visit | Attending: Family Medicine | Admitting: Family Medicine

## 2019-12-15 ENCOUNTER — Other Ambulatory Visit: Payer: Self-pay

## 2019-12-15 ENCOUNTER — Ambulatory Visit: Payer: Self-pay

## 2019-12-15 ENCOUNTER — Ambulatory Visit: Payer: Medicare Other

## 2019-12-15 ENCOUNTER — Encounter: Payer: Self-pay | Admitting: Family Medicine

## 2019-12-15 VITALS — BP 141/81 | HR 85 | Ht 65.0 in | Wt 165.0 lb

## 2019-12-15 DIAGNOSIS — M25511 Pain in right shoulder: Secondary | ICD-10-CM

## 2019-12-15 DIAGNOSIS — M25621 Stiffness of right elbow, not elsewhere classified: Secondary | ICD-10-CM | POA: Diagnosis present

## 2019-12-15 DIAGNOSIS — M25611 Stiffness of right shoulder, not elsewhere classified: Secondary | ICD-10-CM

## 2019-12-15 DIAGNOSIS — M19021 Primary osteoarthritis, right elbow: Secondary | ICD-10-CM | POA: Insufficient documentation

## 2019-12-15 DIAGNOSIS — M25521 Pain in right elbow: Secondary | ICD-10-CM | POA: Insufficient documentation

## 2019-12-15 DIAGNOSIS — M25631 Stiffness of right wrist, not elsewhere classified: Secondary | ICD-10-CM

## 2019-12-15 DIAGNOSIS — R29898 Other symptoms and signs involving the musculoskeletal system: Secondary | ICD-10-CM

## 2019-12-15 MED ORDER — METHYLPREDNISOLONE ACETATE 40 MG/ML IJ SUSP
40.0000 mg | Freq: Once | INTRAMUSCULAR | Status: AC
  Administered 2019-12-15: 40 mg via INTRA_ARTICULAR
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
## 2019-12-15 NOTE — Patient Instructions (Signed)
Nice to meet you Please try ice  I will call with the results from today   Please send me a message in MyChart with any questions or updates.  Please see me back in 4 weeks.   --Dr. Jordan Likes
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
## 2019-12-16 ENCOUNTER — Telehealth: Payer: Self-pay | Admitting: Family Medicine

## 2019-12-16 NOTE — Telephone Encounter (Signed)
Informed of results.   Myra Rude, MD [facility] 12/16/2019, 9:50 AM

## 2019-12-17 ENCOUNTER — Ambulatory Visit: Payer: Medicare Other | Admitting: Physical Therapy

## 2019-12-17 ENCOUNTER — Encounter: Payer: Self-pay | Admitting: Physical Therapy

## 2019-12-17 ENCOUNTER — Other Ambulatory Visit: Payer: Self-pay

## 2019-12-17 DIAGNOSIS — M25611 Stiffness of right shoulder, not elsewhere classified: Secondary | ICD-10-CM

## 2019-12-17 DIAGNOSIS — M25511 Pain in right shoulder: Secondary | ICD-10-CM

## 2019-12-17 DIAGNOSIS — R29898 Other symptoms and signs involving the musculoskeletal system: Secondary | ICD-10-CM

## 2019-12-17 DIAGNOSIS — M25621 Stiffness of right elbow, not elsewhere classified: Secondary | ICD-10-CM

## 2019-12-17 DIAGNOSIS — M25631 Stiffness of right wrist, not elsewhere classified: Secondary | ICD-10-CM

## 2019-12-17 DIAGNOSIS — M25521 Pain in right elbow: Secondary | ICD-10-CM | POA: Diagnosis not present
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
## 2019-12-23 ENCOUNTER — Ambulatory Visit: Payer: Medicare Other

## 2019-12-23 ENCOUNTER — Other Ambulatory Visit: Payer: Self-pay

## 2019-12-23 DIAGNOSIS — M25511 Pain in right shoulder: Secondary | ICD-10-CM

## 2019-12-23 DIAGNOSIS — R29898 Other symptoms and signs involving the musculoskeletal system: Secondary | ICD-10-CM

## 2019-12-23 DIAGNOSIS — M25521 Pain in right elbow: Secondary | ICD-10-CM | POA: Diagnosis not present

## 2019-12-23 DIAGNOSIS — M25631 Stiffness of right wrist, not elsewhere classified: Secondary | ICD-10-CM

## 2019-12-23 DIAGNOSIS — M25611 Stiffness of right shoulder, not elsewhere classified: Secondary | ICD-10-CM

## 2019-12-23 DIAGNOSIS — M25621 Stiffness of right elbow, not elsewhere classified: Secondary | ICD-10-CM
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
## 2019-12-23 NOTE — Patient Instructions (Signed)

## 2019-12-29 ENCOUNTER — Ambulatory Visit: Payer: Medicare Other | Admitting: Physical Therapy

## 2019-12-29 ENCOUNTER — Other Ambulatory Visit: Payer: Self-pay

## 2019-12-29 ENCOUNTER — Encounter: Payer: Self-pay | Admitting: Physical Therapy

## 2019-12-29 DIAGNOSIS — M25521 Pain in right elbow: Secondary | ICD-10-CM | POA: Diagnosis not present

## 2019-12-29 DIAGNOSIS — M25511 Pain in right shoulder: Secondary | ICD-10-CM

## 2019-12-29 DIAGNOSIS — M25611 Stiffness of right shoulder, not elsewhere classified: Secondary | ICD-10-CM

## 2019-12-29 DIAGNOSIS — M25631 Stiffness of right wrist, not elsewhere classified: Secondary | ICD-10-CM

## 2019-12-29 DIAGNOSIS — M25621 Stiffness of right elbow, not elsewhere classified: Secondary | ICD-10-CM

## 2019-12-29 DIAGNOSIS — R29898 Other symptoms and signs involving the musculoskeletal system: Secondary | ICD-10-CM
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
## 2019-12-30 ENCOUNTER — Encounter: Payer: Medicare Other | Admitting: Physical Therapy

## 2020-01-06 ENCOUNTER — Ambulatory Visit: Payer: Medicare Other | Admitting: Physical Therapy

## 2020-01-06 ENCOUNTER — Encounter: Payer: Self-pay | Admitting: Physical Therapy

## 2020-01-06 ENCOUNTER — Other Ambulatory Visit: Payer: Self-pay

## 2020-01-06 DIAGNOSIS — M25631 Stiffness of right wrist, not elsewhere classified: Secondary | ICD-10-CM

## 2020-01-06 DIAGNOSIS — M25611 Stiffness of right shoulder, not elsewhere classified: Secondary | ICD-10-CM

## 2020-01-06 DIAGNOSIS — M25511 Pain in right shoulder: Secondary | ICD-10-CM

## 2020-01-06 DIAGNOSIS — R29898 Other symptoms and signs involving the musculoskeletal system: Secondary | ICD-10-CM

## 2020-01-06 DIAGNOSIS — M25621 Stiffness of right elbow, not elsewhere classified: Secondary | ICD-10-CM

## 2020-01-06 DIAGNOSIS — M25521 Pain in right elbow: Secondary | ICD-10-CM
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
## 2020-01-07 ENCOUNTER — Ambulatory Visit: Payer: Medicare Other | Admitting: Physical Therapy

## 2020-01-09 ENCOUNTER — Other Ambulatory Visit: Payer: Self-pay

## 2020-01-09 ENCOUNTER — Ambulatory Visit: Payer: Medicare Other | Attending: Nurse Practitioner

## 2020-01-09 DIAGNOSIS — M25521 Pain in right elbow: Secondary | ICD-10-CM | POA: Diagnosis present

## 2020-01-09 DIAGNOSIS — M25511 Pain in right shoulder: Secondary | ICD-10-CM | POA: Diagnosis present

## 2020-01-09 DIAGNOSIS — M25611 Stiffness of right shoulder, not elsewhere classified: Secondary | ICD-10-CM | POA: Diagnosis present

## 2020-01-09 DIAGNOSIS — R29898 Other symptoms and signs involving the musculoskeletal system: Secondary | ICD-10-CM | POA: Diagnosis present

## 2020-01-09 DIAGNOSIS — M25631 Stiffness of right wrist, not elsewhere classified: Secondary | ICD-10-CM | POA: Insufficient documentation

## 2020-01-09 DIAGNOSIS — M25621 Stiffness of right elbow, not elsewhere classified: Secondary | ICD-10-CM | POA: Insufficient documentation
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
## 2020-01-12 ENCOUNTER — Ambulatory Visit (INDEPENDENT_AMBULATORY_CARE_PROVIDER_SITE_OTHER): Payer: Medicare Other | Admitting: Family Medicine

## 2020-01-12 ENCOUNTER — Other Ambulatory Visit: Payer: Self-pay

## 2020-01-12 ENCOUNTER — Encounter: Payer: Self-pay | Admitting: Family Medicine

## 2020-01-12 DIAGNOSIS — M19021 Primary osteoarthritis, right elbow: Secondary | ICD-10-CM | POA: Diagnosis present

## 2020-01-12 NOTE — Assessment & Plan Note (Signed)
End-stage changes of the elbow.  Still lacks full extension.  Pain is intermittent. -Counseled on optimizing her bone health. -Counseled on home exercise therapy and supportive care. -Could consider injection prior to restarting physical therapy.

## 2020-01-12 NOTE — Progress Notes (Signed)
Ann Hobbs - 74 y.o. female MRN [id number]  Date of birth: 1945-11-07  SUBJECTIVE:  Including CC & ROS.  Chief Complaint  Patient presents with  . Follow-up    follow up for right elbow    Ann Hobbs is a 74 y.o. female that is following up for her right elbow pain.  She was provided an injection on 3/8.  She noticed improvement of the pain for about 2 weeks.  Has been taking Tylenol for the pain as well.  Denies any radicular numbness or tingling symptoms.  Has finished physical therapy for the time being   Review of the right elbow x-ray from 3/8 shows status changes of the elbow joint.   Review of Systems See HPI   HISTORY: Past Medical, Surgical, Social, and Family History Reviewed & Updated per EMR.   Pertinent Historical Findings include:  Past Medical History:  Diagnosis Date  . Diabetes mellitus without complication ([place])   . Essential hypertension 04/25/2016  . Hyperlipidemia 04/25/2016  . Hypertension   . Paroxysmal atrial fibrillation ([place]) 04/25/2016  . PONV (postoperative nausea and vomiting)   . Typical atrial flutter ([place]) 04/25/2016    Past Surgical History:  Procedure Laterality Date  . ATRIAL FIBRILLATION ABLATION N/A 05/30/2018   Procedure: ATRIAL FIBRILLATION ABLATION;  Surgeon: Thompson Grayer, MD;  Location: [facility] CV LAB;  Service: Cardiovascular;  Laterality: N/A;  . CARDIOVERSION N/A 02/05/2018   Procedure: CARDIOVERSION;  Surgeon: Josue Hector, MD;  Location: [facility] ENDOSCOPY;  Service: Cardiovascular;  Laterality: N/A;    Family History  Problem Relation Age of Onset  . Cancer Father     Social History   Socioeconomic History  . Marital status: Married    Spouse name: Not on file  . Number of children: Not on file  . Years of education: Not on file  . Highest education level: Not on file  Occupational History  . Not on file  Tobacco Use  . Smoking status: Never Smoker  . Smokeless tobacco: Never Used  Substance and  Sexual Activity  . Alcohol use: Yes    Comment: occasional social  . Drug use: Never  . Sexual activity: Not on file  Other Topics Concern  . Not on file  Social History Narrative   Lives with spouse   Retired Surveyor, quantity   Social Determinants of Health   Financial Resource Strain:   . Difficulty of Paying Living Expenses:   Food Insecurity:   . Worried About Charity fundraiser in the Last Year:   . Arboriculturist in the Last Year:   Transportation Needs:   . Film/video editor (Medical):   Marland Kitchen Lack of Transportation (Non-Medical):   Physical Activity:   . Days of Exercise per Week:   . Minutes of Exercise per Session:   Stress:   . Feeling of Stress :   Social Connections:   . Frequency of Communication with Friends and Family:   . Frequency of Social Gatherings with Friends and Family:   . Attends Religious Services:   . Active Member of Clubs or Organizations:   . Attends Archivist Meetings:   Marland Kitchen Marital Status:   Intimate Partner Violence:   . Fear of Current or Ex-Partner:   . Emotionally Abused:   Marland Kitchen Physically Abused:   . Sexually Abused:      PHYSICAL EXAM:  VS: BP 128/76   Pulse 85   Ht 5\' 5"  (1.651  m)   Wt 175 lb (79.4 kg)   BMI 29.12 kg/m  Physical Exam Gen: NAD, alert, cooperative with exam, well-appearing MSK:  Right elbow: Lacks full extension. Normal flexion. Some limitations with full supination. Normal grip strength. No specific area of tenderness palpation. Neurovascular intact     ASSESSMENT & PLAN:   Degenerative arthritis of right elbow End-stage changes of the elbow.  Still lacks full extension.  Pain is intermittent. -Counseled on optimizing her bone health. -Counseled on home exercise therapy and supportive care. -Could consider injection prior to restarting physical therapy.

## 2020-01-12 NOTE — Patient Instructions (Signed)
Good to see you Please try ice as needed Please try compression  Please try optimizing your bone health  Take tylenol 650 mg three times a day is the best evidence based medicine we have for arthritis.  Glucosamine sulfate 750mg  twice a day is a supplement that has been shown to help moderate to severe arthritis. Vitamin D 2000 IU daily Fish oil 2 grams daily.  Tumeric 500mg  twice daily.  Please send me a message in MyChart with any questions or updates.  Please see me back in 4 weeks.   --Dr. 

## 2020-01-13 ENCOUNTER — Ambulatory Visit: Payer: Medicare Other

## 2020-01-16 ENCOUNTER — Encounter: Payer: Medicare Other | Admitting: Physical Therapy

## 2020-01-20 ENCOUNTER — Encounter: Payer: Medicare Other | Admitting: Physical Therapy

## 2020-02-05 ENCOUNTER — Telehealth: Payer: Self-pay | Admitting: Cardiovascular Disease

## 2020-02-05 NOTE — Telephone Encounter (Signed)
Left message to call back  

## 2020-02-05 NOTE — Telephone Encounter (Signed)
New Message    Pt is calling about [facility] application for medication Eliquis  She says she was talking to [place] about it before and then had found another bottle of it ad finished that and now she is needing the assistance for future refills     Please call

## 2020-02-16 NOTE — Telephone Encounter (Signed)
Patient brought her financial portion of papers for patient assistance but not her application portion Left message to call back to discuss  Did give Eliquis 5 mg # 3 Lot # [id number] exp 12/2021

## 2020-02-17 NOTE — Telephone Encounter (Signed)
Spoke with patient and she will bring her portion of patient assistance forms by tomorrow for me to fax

## 2020-02-17 NOTE — Telephone Encounter (Signed)
Follow up: ° ° °Patient returning call  ° ° ° °

## 2020-02-18 MED ORDER — APIXABAN 5 MG PO TABS: 5.0000 mg | ORAL_TABLET | Freq: Two times a day (BID) | ORAL | 3 refills | Status: DC

## 2020-02-27 ENCOUNTER — Telehealth: Payer: Self-pay | Admitting: Cardiovascular Disease

## 2020-02-27 NOTE — Telephone Encounter (Signed)
New Message    Pt is calling in regards to her [employer] application and would like to discuss it with Juliette Alcide     Please call

## 2020-02-27 NOTE — Telephone Encounter (Signed)
Spoke with pt, she wanted to make melinda aware she was denied because there was no coverage information included and also that she does not meet the out of pocket requirement. She is frustrated and wonders if the rules have changed. Called and spoke with [employer], according to the income that was given to the company, the out of pocket amount increases. She would need to pay $2,611.00 out of pocket to qualify. Patient made aware.

## 2020-03-02 NOTE — Telephone Encounter (Signed)
Left message to call back  

## 2020-03-18 NOTE — Telephone Encounter (Signed)
See phone note 5/21

## 2020-03-24 ENCOUNTER — Other Ambulatory Visit: Payer: Self-pay | Admitting: Cardiovascular Disease

## 2020-03-24 MED ORDER — APIXABAN 5 MG PO TABS: 5.0000 mg | ORAL_TABLET | Freq: Two times a day (BID) | ORAL | 1 refills | Status: DC

## 2020-03-24 NOTE — Telephone Encounter (Signed)
° ° ° °*  STAT* If patient is at the pharmacy, call can be transferred to refill team.   1. Which medications need to be refilled? (please list name of each medication and dose if known)   apixaban (ELIQUIS) 5 MG TABS tablet    2. Which pharmacy/location (including street and city if local pharmacy) is medication to be sent to? [facility] [place], [place] - [address]  3. Do they need a 30 day or 90 day supply? 90 days   Pt took last one yesterday, she completely out of this medication

## 2020-05-07 ENCOUNTER — Encounter: Payer: Self-pay | Admitting: Cardiology

## 2020-05-07 ENCOUNTER — Ambulatory Visit (INDEPENDENT_AMBULATORY_CARE_PROVIDER_SITE_OTHER): Payer: Medicare Other | Admitting: Cardiology

## 2020-05-07 ENCOUNTER — Other Ambulatory Visit: Payer: Self-pay

## 2020-05-07 VITALS — BP 122/56 | HR 73 | Ht 65.0 in | Wt 170.0 lb

## 2020-05-07 DIAGNOSIS — I480 Paroxysmal atrial fibrillation: Secondary | ICD-10-CM | POA: Diagnosis not present

## 2020-05-07 DIAGNOSIS — T148XXA Other injury of unspecified body region, initial encounter: Secondary | ICD-10-CM

## 2020-05-07 DIAGNOSIS — Z7901 Long term (current) use of anticoagulants: Secondary | ICD-10-CM

## 2020-05-07 DIAGNOSIS — I10 Essential (primary) hypertension: Secondary | ICD-10-CM | POA: Diagnosis not present

## 2020-05-07 DIAGNOSIS — I951 Orthostatic hypotension: Secondary | ICD-10-CM

## 2020-05-07 DIAGNOSIS — E119 Type 2 diabetes mellitus without complications: Secondary | ICD-10-CM | POA: Diagnosis not present

## 2020-05-07 NOTE — Assessment & Plan Note (Signed)
Rt hip hematoma after mechanical fall two weeks ago.  She is off Eliquis.

## 2020-05-07 NOTE — Assessment & Plan Note (Signed)
Recurrent episodes of orthostatic hypotension- some sound vagal, some sound secondary to AF.  Most recent episode sounds vaso vagal

## 2020-05-07 NOTE — Progress Notes (Signed)
Cardiology Office Note:    Date:  05/07/2020   ID:  Ann Hobbs, DOB 01/18/1946, MRN [id number]  PCP:  Berkley Harvey, NP  Cardiologist:  Skeet Latch, MD  Electrophysiologist:  None   Referring MD: Berkley Harvey, NP   CC:  Taken off Eliquis recently after a fall  History of Present Illness:    Ann Hobbs is a 74 y.o. female with a hx of PAF.  She is been followed by Dr. Rayann Heman.  She had a radiofrequency ablation ultimately for A. fib in August 2019.  Prior Myoview and echocardiogram were essentially normal April 2019.  She has been on Eliquis.  Patient was recently traveling in [place].  She had a fall and suffered a large hematoma of her right hip.  She received some pain medications and later that night got up in the middle the night to take another pill and had a syncopal spell.  She was brought to the emergency room.  Her EKG was normal.  The patient reports she can tell when she has atrial fibrillation and she feels like she has not had recurrent A. fib.  She also has a Chiropodist.  Apparently the emergency room physician told her he did not think she was a good candidate for Eliquis.  He told her that people that have blackout spells should not be on blood thinner.  He told her an aspirin a day would be okay.  The patient does have a history of syncope.  She apparently has some vasovagal tendency.  She is also had syncope in the setting of atrial fibrillation.  Since she had her ablation this has not recurred.  This is the first episode of vasovagal syncope she has had in a few years.  Past Medical History:  Diagnosis Date  . Diabetes mellitus without complication ([place])   . Essential hypertension 04/25/2016  . Hyperlipidemia 04/25/2016  . Hypertension   . Paroxysmal atrial fibrillation ([place]) 04/25/2016  . PONV (postoperative nausea and vomiting)   . Typical atrial flutter ([place]) 04/25/2016    Past Surgical History:  Procedure Laterality Date  . ATRIAL  FIBRILLATION ABLATION N/A 05/30/2018   Procedure: ATRIAL FIBRILLATION ABLATION;  Surgeon: Thompson Grayer, MD;  Location: [facility];  Service: Cardiovascular;  Laterality: N/A;  . CARDIOVERSION N/A 02/05/2018   Procedure: CARDIOVERSION;  Surgeon: Josue Hector, MD;  [facility] ENDOSCOPY;  Service: Cardiovascular;  Laterality: N/A;    Current Medications: Current Meds  Medication Sig  . atorvastatin (LIPITOR) 20 MG tablet TAKE ONE (1) TABLET BY MOUTH EACH DAY AT6PM  . Blood Glucose Monitoring Suppl (GHT BLOOD GLUCOSE MONITOR) w/Device KIT Apply 1 strip topically as directed.  . Calcium Carb-Cholecalciferol (CALCIUM+D3) 600-800 MG-UNIT TABS Take 2 tablets by mouth 2 (two) times daily.   . Multiple Vitamins-Minerals (WOMENS MULTIVITAMIN PLUS PO) Take 1 tablet by mouth daily.  . ReliOn Ultra Thin Lancets MISC Apply 1 strip topically as directed.     Allergies:   Codeine and Prednisone   Social History   Socioeconomic History  . Marital status: Married    Spouse name: Not on file  . Number of children: Not on file  . Years of education: Not on file  . Highest education level: Not on file  Occupational History  . Not on file  Tobacco Use  . Smoking status: Never Smoker  . Smokeless tobacco: Never Used  Vaping Use  . Vaping Use: Never used  Substance and Sexual  Activity  . Alcohol use: Yes    Comment: occasional social  . Drug use: Never  . Sexual activity: Not on file  Other Topics Concern  . Not on file  Social History Narrative   Lives with spouse   Retired Surveyor, quantity   Social Determinants of Health   Financial Resource Strain:   . Difficulty of Paying Living Expenses:   Food Insecurity:   . Worried About Charity fundraiser in the Last Year:   . Arboriculturist in the Last Year:   Transportation Needs:   . Film/video editor (Medical):   Marland Kitchen Lack of Transportation (Non-Medical):   Physical Activity:   . Days of Exercise per Week:   . Minutes  of Exercise per Session:   Stress:   . Feeling of Stress :   Social Connections:   . Frequency of Communication with Friends and Family:   . Frequency of Social Gatherings with Friends and Family:   . Attends Religious Services:   . Active Member of Clubs or Organizations:   . Attends Archivist Meetings:   Marland Kitchen Marital Status:      Family History: The patient's family history includes Cancer in her father.  ROS:   Please see the history of present illness.     All other systems reviewed and are negative.  EKGs/Labs/Other Studies Reviewed:    The following studies were reviewed today: Myoview 2019- Echo 2017  EKG:  EKG is ordered today.  The ekg ordered today demonstrates NSR, PVCs, trigeminy   Recent Labs: No results found for requested labs within last 8760 hours.  Recent Lipid Panel No results found for: CHOL, TRIG, HDL, CHOLHDL, VLDL, LDLCALC, LDLDIRECT  Physical Exam:    VS:  BP (!) 122/56   Pulse 73   Ht '5\' 5"'$  (1.651 m)   Wt 170 lb (77.1 kg)   BMI 28.29 kg/m     Wt Readings from Last 3 Encounters:  05/07/20 170 lb (77.1 kg)  01/12/20 175 lb (79.4 kg)  12/15/19 165 lb (74.8 kg)     GEN: Well nourished, well developed in no acute distress HEENT: Normal NECK: No JVD; No carotid bruits CARDIAC: RRR, no murmurs, rubs, gallops RESPIRATORY:  Clear to auscultation without rales, wheezing or rhonchi  ABDOMEN: Soft, non-tender, non-distended MUSCULOSKELETAL: grapefruit sized hematoma Rt his with surrounding ecchymosis SKIN: Warm and dry NEUROLOGIC:  Alert and oriented x 3 PSYCHIATRIC:  Normal affect   ASSESSMENT:    Hematoma Rt hip hematoma after mechanical fall two weeks ago.  She is off Eliquis.  Paroxysmal atrial fibrillation (HCC) S/P RFA 05/30/2018- no recurrance  Orthostatic hypotension Recurrent episodes of orthostatic hypotension- some sound vagal, some sound secondary to AF.  Most recent episode sounds vaso vagal  Non-insulin dependent  type 2 diabetes mellitus (HCC) Diet conttrolled  Chronic anticoagulation CHADS VASC=3, age, sex, DM.  She is off Eliquis and would like to remain off.  PLAN:    Will review with Dr Rayann Heman and then discuss his recommendations with the patient.  For now stay off Eliquis.  Orthostatic precautions discussed.     Medication Adjustments/Labs and Tests Ordered: Current medicines are reviewed at length with the patient today.  Concerns regarding medicines are outlined above.  Orders Placed This Encounter  Procedures  . EKG 12-Lead   No orders of the defined types were placed in this encounter.   Patient Instructions  Medication Instructions:   Your physician recommends that  you continue on your current medications as directed. Please refer to the Current Medication list given to you today.   *If you need a refill on your cardiac medications before your next appointment, please call your pharmacy*   Follow-Up: At [facility], you and your health needs are our priority.  As part of our continuing mission to provide you with exceptional heart care, we have created designated Provider Care Teams.  These Care Teams include your primary Cardiologist (physician) and Advanced Practice Providers (APPs -  Physician Assistants and Nurse Practitioners) who all work together to provide you with the care you need, when you need it.  We recommend signing up for the patient portal called "MyChart".  Sign up information is provided on this After Visit Summary.  MyChart is used to connect with patients for Virtual Visits (Telemedicine).  Patients are able to view lab/test results, encounter notes, upcoming appointments, etc.  Non-urgent messages can be sent to your provider as well.   To learn more about what you can do with MyChart, go to [email or web address].    Your next appointment:   12 month(s)  The format for your next appointment:   In Person  Provider:   You may see Skeet Latch,  MD or one of the following Advanced Practice Providers on your designated Care Team:    Kerin Ransom, PA-C  [place], [place]  Coletta Memos, [place]    Other Instructions Please call our office 2 months in advance to schedule your follow-up appointment.     Signed, Kerin Ransom, PA-C  05/07/2020 11:17 AM    [place]

## 2020-05-07 NOTE — Assessment & Plan Note (Signed)
S/P RFA 05/30/2018- no recurrance

## 2020-05-07 NOTE — Assessment & Plan Note (Signed)
Diet conttrolled

## 2020-05-07 NOTE — Patient Instructions (Signed)
Medication Instructions:   Your physician recommends that you continue on your current medications as directed. Please refer to the Current Medication list given to you today.   *If you need a refill on your cardiac medications before your next appointment, please call your pharmacy*   Follow-Up: At [facility], you and your health needs are our priority.  As part of our continuing mission to provide you with exceptional heart care, we have created designated Provider Care Teams.  These Care Teams include your primary Cardiologist (physician) and Advanced Practice Providers (APPs -  Physician Assistants and Nurse Practitioners) who all work together to provide you with the care you need, when you need it.  We recommend signing up for the patient portal called "MyChart".  Sign up information is provided on this After Visit Summary.  MyChart is used to connect with patients for Virtual Visits (Telemedicine).  Patients are able to view lab/test results, encounter notes, upcoming appointments, etc.  Non-urgent messages can be sent to your provider as well.   To learn more about what you can do with MyChart, go to [email or web address].    Your next appointment:   12 month(s)  The format for your next appointment:   In Person  Provider:   You may see Chilton Si, MD or one of the following Advanced Practice Providers on your designated Care Team:    Corine Shelter, PA-C  [place], [place]  Edd Fabian, [place]    Other Instructions Please call our office 2 months in advance to schedule your follow-up appointment.

## 2020-05-07 NOTE — Assessment & Plan Note (Signed)
CHADS VASC=3, age, sex, DM.  She is off Eliquis and would like to remain off.

## 2020-05-11 NOTE — Progress Notes (Signed)
Please tell this patient to remain off Eliquis till she sees Dr Johney Frame- she needs an appointment to see him in 4 weeks.  Corine Shelter PA-C 05/11/2020 10:57 AM

## 2020-05-12 ENCOUNTER — Telehealth: Payer: Self-pay

## 2020-05-12 NOTE — Telephone Encounter (Signed)
NOTES ON FILE FROM [facility] [phone] SENT REFERRAL TO SCHEDULING 

## 2020-05-12 NOTE — Progress Notes (Signed)
Spoke with pt advised her to remind off Eliquis, message send to scheduler to schedule a 4-6 weeks appointment with Dr Johney Frame

## 2020-05-18 ENCOUNTER — Ambulatory Visit: Payer: Medicare Other | Admitting: Cardiovascular Disease

## 2020-05-31 ENCOUNTER — Telehealth: Payer: Self-pay | Admitting: Cardiovascular Disease

## 2020-05-31 NOTE — Telephone Encounter (Signed)
Fax received and scanned to chart.

## 2020-05-31 NOTE — Telephone Encounter (Signed)
Called and spoke to [facility]. Made her aware that the [facility] office has not received any fax with EKG and neither has the [facility] office. She states that she tried to send to the [phone] and it would not go through. I asked for her to fax it to [phone].   She states that when the patient was seen in the office this morning that she was completely asymptomatic but when they were working her up her HR was 43 bpm with the pulse ox. She states that by the time they did the EKG her HR was 79 bpm. I asked her what they EKG showed and she said SR with PVCs. Made her aware that if the patient is having PVCs that it will not accurately measure her HR on the pulse Ox.

## 2020-05-31 NOTE — Telephone Encounter (Signed)
Ann Hobbs is returning Ann Hobbs's call. The fax was sent to [phone].

## 2020-05-31 NOTE — Telephone Encounter (Signed)
Ann Hobbs is calling from the patient's PCP office stating they ran an EKG and are faxing over the results. She is requesting the results be compared to her last EKG taken in our office to see if she is needing a sooner appt with Dr. Johney Frame. Please advise.

## 2020-05-31 NOTE — Telephone Encounter (Signed)
Left message for Ann Hobbs from patient's PCP to call back.  Need to find out what # the EKG was faxed to and if the patient is symptomatic.

## 2020-06-02 NOTE — Telephone Encounter (Signed)
[facility] called and wanted to see if Dr. Duke Salvia had a chance to interpret the fax yet. Please let [facility] know waht Dr. Duke Salvia says and she will relay the information to the patient's PCP

## 2020-06-02 NOTE — Telephone Encounter (Signed)
EKG is sinus with PVCs.  She can wait for her scheduled follow up.

## 2020-06-04 NOTE — Telephone Encounter (Signed)
Genisis not in office today, left message ok to wait

## 2020-06-21 ENCOUNTER — Other Ambulatory Visit: Payer: Self-pay

## 2020-06-21 ENCOUNTER — Encounter: Payer: Self-pay | Admitting: Internal Medicine

## 2020-06-21 ENCOUNTER — Ambulatory Visit (INDEPENDENT_AMBULATORY_CARE_PROVIDER_SITE_OTHER): Payer: Medicare Other | Admitting: Internal Medicine

## 2020-06-21 VITALS — BP 130/82 | HR 83 | Ht 65.0 in | Wt 174.2 lb

## 2020-06-21 DIAGNOSIS — I480 Paroxysmal atrial fibrillation: Secondary | ICD-10-CM | POA: Diagnosis not present

## 2020-06-21 DIAGNOSIS — I10 Essential (primary) hypertension: Secondary | ICD-10-CM | POA: Diagnosis not present

## 2020-06-21 DIAGNOSIS — I483 Typical atrial flutter: Secondary | ICD-10-CM | POA: Diagnosis not present

## 2020-06-21 DIAGNOSIS — R55 Syncope and collapse: Secondary | ICD-10-CM

## 2020-06-21 NOTE — Patient Instructions (Addendum)
Medication Instructions:  Your physician recommends that you continue on your current medications as directed. Please refer to the Current Medication list given to you today.  *If you need a refill on your cardiac medications before your next appointment, please call your pharmacy*  Lab Work: None ordered.  If you have labs (blood work) drawn today and your tests are completely normal, you will receive your results only by: Marland Kitchen MyChart Message (if you have MyChart) OR . A paper copy in the mail If you have any lab test that is abnormal or we need to change your treatment, we will call you to review the results.  Testing/Procedures: Echo Please schedule Your physician has requested that you have an echocardiogram. Echocardiography is a painless test that uses sound waves to create images of your heart. It provides your doctor with information about the size and shape of your heart and how well your heart's chambers and valves are working. This procedure takes approximately one hour. There are no restrictions for this procedure.  Follow-Up: At [facility], you and your health needs are our priority.  As part of our continuing mission to provide you with exceptional heart care, we have created designated Provider Care Teams.  These Care Teams include your primary Cardiologist (physician) and Advanced Practice Providers (APPs -  Physician Assistants and Nurse Practitioners) who all work together to provide you with the care you need, when you need it.  We recommend signing up for the patient portal called "MyChart".  Sign up information is provided on this After Visit Summary.  MyChart is used to connect with patients for Virtual Visits (Telemedicine).  Patients are able to view lab/test results, encounter notes, upcoming appointments, etc.  Non-urgent messages can be sent to your provider as well.   To learn more about what you can do with MyChart, go to [email or web address].    Your next  appointment:   Your physician wants you to follow-up in: 6 months with Dr. Johney Frame.   Other Instructions:

## 2020-06-21 NOTE — Progress Notes (Signed)
PCP: Berkley Harvey, NP Primary Cardiologist: Dr Oval Linsey Primary EP: Dr Abran Cantor Ann Hobbs is a 74 y.o. female who presents today for routine electrophysiology followup.  Since last being seen in our clinic, the patient reports doing very well.  She is not aware of any afib over the past year.  She has occasional PVCs.  She fell 7/10 after stepping on a misplaced board on a construction site (home remodel) and fell about 3 feet.  She developed a large hematoma.  She had syncope that night in the setting of pain and anemia.  She has done well since.  Her hematoma is slowly improving.  She has stopped her eliquis.   Today, she denies symptoms of palpitations, chest pain, shortness of breath,  lower extremity edema, dizziness, or further syncope.  The patient is otherwise without complaint today.   Past Medical History:  Diagnosis Date  . A-fib ([place])   . Brain lesion   . Diabetes mellitus without complication ([place])   . Dizziness   . Elevated glucose   . Essential hypertension 04/25/2016  . Fall   . Hematoma   . Hyperlipidemia 04/25/2016  . Hypertension   . Paroxysmal atrial fibrillation ([place]) 04/25/2016  . PONV (postoperative nausea and vomiting)   . Typical atrial flutter ([place]) 04/25/2016  . Weakness    Past Surgical History:  Procedure Laterality Date  . APPENDECTOMY    . ATRIAL FIBRILLATION ABLATION N/A 05/30/2018   Procedure: ATRIAL FIBRILLATION ABLATION;  Surgeon: Thompson Grayer, MD;  Location: [place];  Service: Cardiovascular;  Laterality: N/A;  . CARDIOVERSION N/A 02/05/2018   Procedure: CARDIOVERSION;  Surgeon: Josue Hector, MD;  Location: [facility] ENDOSCOPY;  Service: Cardiovascular;  Laterality: N/A;  . ELBOW FRACTURE SURGERY    . KNEE ARTHROSCOPY    . PARTIAL HYSTERECTOMY      ROS- all systems are reviewed and negatives except as per HPI above  Current Outpatient Medications  Medication Sig Dispense Refill  . Acetaminophen (ACETAMIN PO) Take 650 mg by  mouth.    Marland Kitchen apixaban (ELIQUIS) 5 MG TABS tablet Take 5 mg by mouth 2 (two) times daily.    Marland Kitchen atorvastatin (LIPITOR) 20 MG tablet TAKE ONE (1) TABLET BY MOUTH EACH DAY AT6PM 90 tablet 3  . Blood Glucose Monitoring Suppl (GHT BLOOD GLUCOSE MONITOR) w/Device KIT Apply 1 strip topically as directed.    . Calcium Carb-Cholecalciferol (CALCIUM+D3) 600-800 MG-UNIT TABS Take 2 tablets by mouth 2 (two) times daily.     . clobetasol cream (TEMOVATE) 3.76 % Apply 1 application topically 2 (two) times daily.    Marland Kitchen estradiol (ESTRACE) 0.1 MG/GM vaginal cream Place 1 Applicatorful vaginally at bedtime.    . furosemide (LASIX) 20 MG tablet Take 20 mg by mouth.    . Multiple Vitamins-Minerals (WOMENS MULTIVITAMIN PLUS PO) Take 1 tablet by mouth daily.    . ReliOn Ultra Thin Lancets MISC Apply 1 strip topically as directed.     No current facility-administered medications for this visit.    Physical Exam: Vitals:   06/21/20 0919  BP: 130/82  Pulse: 83  SpO2: 94%  Weight: 174 lb 3.2 oz (79 kg)  Height: $Remove'5\' 5"'qoZHTFY$  (1.651 m)    GEN- The patient is well appearing, alert and oriented x 3 today.   Head- normocephalic, atraumatic Eyes-  Sclera clear, conjunctiva pink Ears- hearing intact Oropharynx- clear Lungs- Clear to ausculation bilaterally, normal work of breathing Heart- Regular rate and rhythm with ectopy  GI- soft, NT, ND, + BS Extremities- no clubbing, cyanosis, or edema Large R hip hematoma is noted through her clothes  Wt Readings from Last 3 Encounters:  06/21/20 174 lb 3.2 oz (79 kg)  05/07/20 170 lb (77.1 kg)  01/12/20 175 lb (79.4 kg)    EKG tracing ordered today is personally reviewed and shows sinus with PVCs  Assessment and Plan:  1. Paroxysmal atrial fibrillation/ atrial flutter S/p prior ablation She has a kardia mobile which reveales sinus chads2vasc score is 3.  She has stopped eliquis due to recent fall/ hematoma.  She is reluctant to resume [place] at this time.  We discussed at  length options of resuming [place] vs watchful waiting.  We also discussed long term monitoring with an ILR for afib to guide anticoagulation.  Given her large R hip hematoma, she is not currently a candidate for [place]. She wishes to stop eliquis and continue watchful waiting for now.  2. HL Continue statin  3. PVCs Asymptomatic We discussed at length today Benign Update echo to exclude structural heart changes  4. Syncope Likely vagal and exacerbated by anemia (she lost several units of PRBCs in her R hip  When she feel).  Echo.  If low risk, no further workup is planned.  5. R hip hematoma Improving Should resolve with supportive care  Return in 6 months for further discussion regarding anticoagulation  Risks, benefits and potential toxicities for medications prescribed and/or refilled reviewed with patient today.   Thompson Grayer MD, [facility] 06/21/2020 9:21 AM

## 2020-07-07 ENCOUNTER — Ambulatory Visit (HOSPITAL_COMMUNITY): Payer: Medicare Other | Attending: Internal Medicine

## 2020-07-07 ENCOUNTER — Other Ambulatory Visit: Payer: Self-pay

## 2020-07-07 DIAGNOSIS — I480 Paroxysmal atrial fibrillation: Secondary | ICD-10-CM

## 2020-07-07 DIAGNOSIS — I10 Essential (primary) hypertension: Secondary | ICD-10-CM | POA: Diagnosis present

## 2020-07-07 DIAGNOSIS — I483 Typical atrial flutter: Secondary | ICD-10-CM

## 2020-07-07 LAB — ECHOCARDIOGRAM COMPLETE
Area-P 1/2: 3.34 cm2
S' Lateral: 2.8 cm

## 2020-07-16 ENCOUNTER — Ambulatory Visit: Payer: Medicare Other | Admitting: Internal Medicine

## 2020-08-04 ENCOUNTER — Other Ambulatory Visit: Payer: Self-pay | Admitting: Cardiovascular Disease

## 2020-08-30 IMAGING — DX DG ELBOW COMPLETE 3+V*R*
4 series · 4 of 4 positions shown · non-contrast
Comparison: Right elbow pain

CLINICAL DATA: Elbow pain

EXAM:
RIGHT ELBOW - COMPLETE 3+ VIEW

[elbow ap]
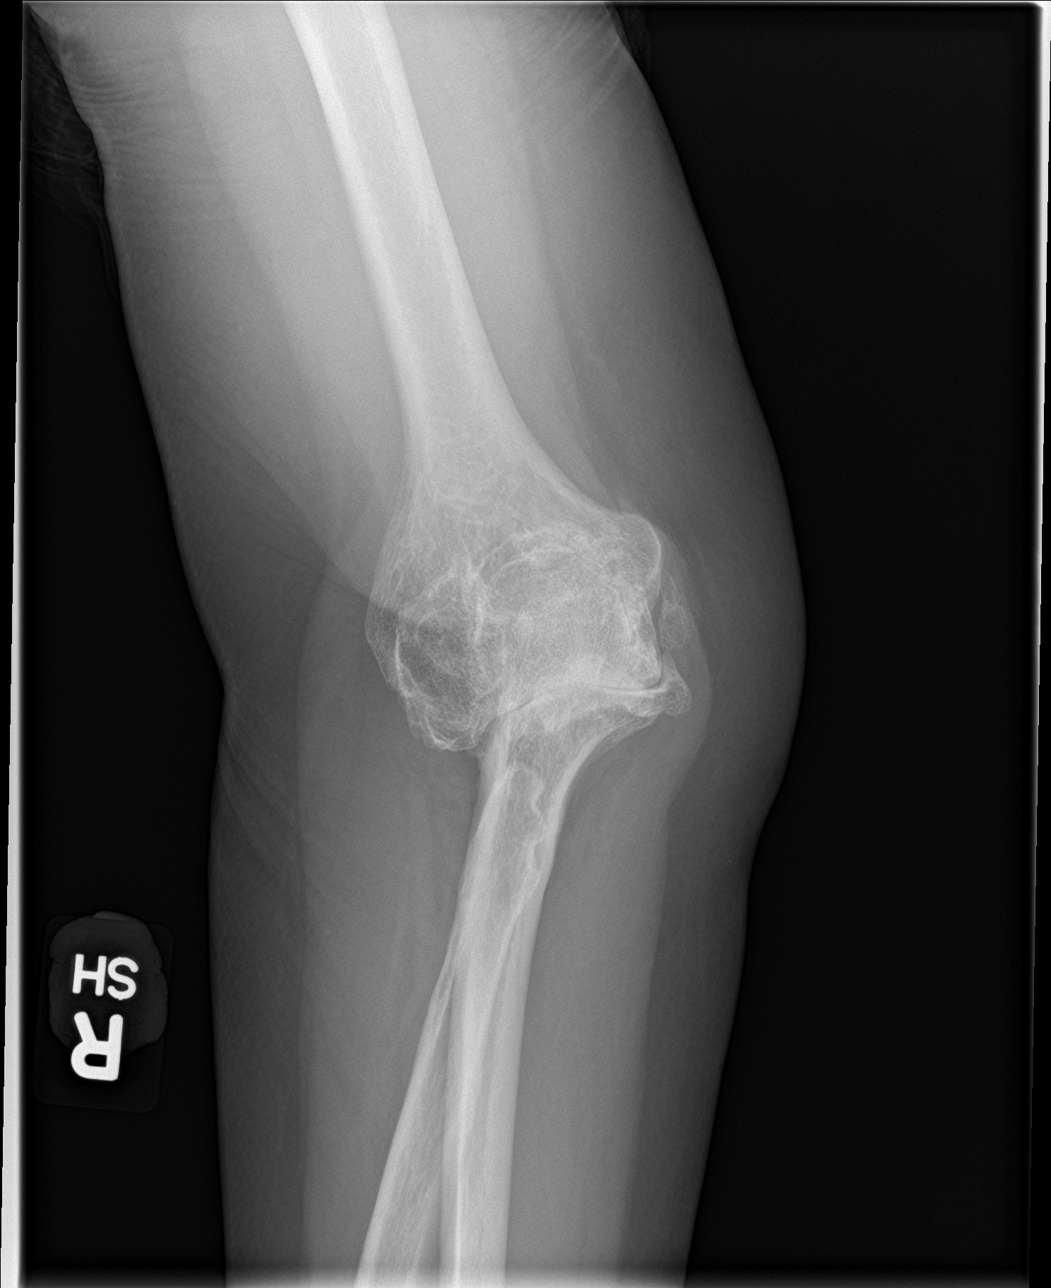

[elbow obl (1 of 2)]
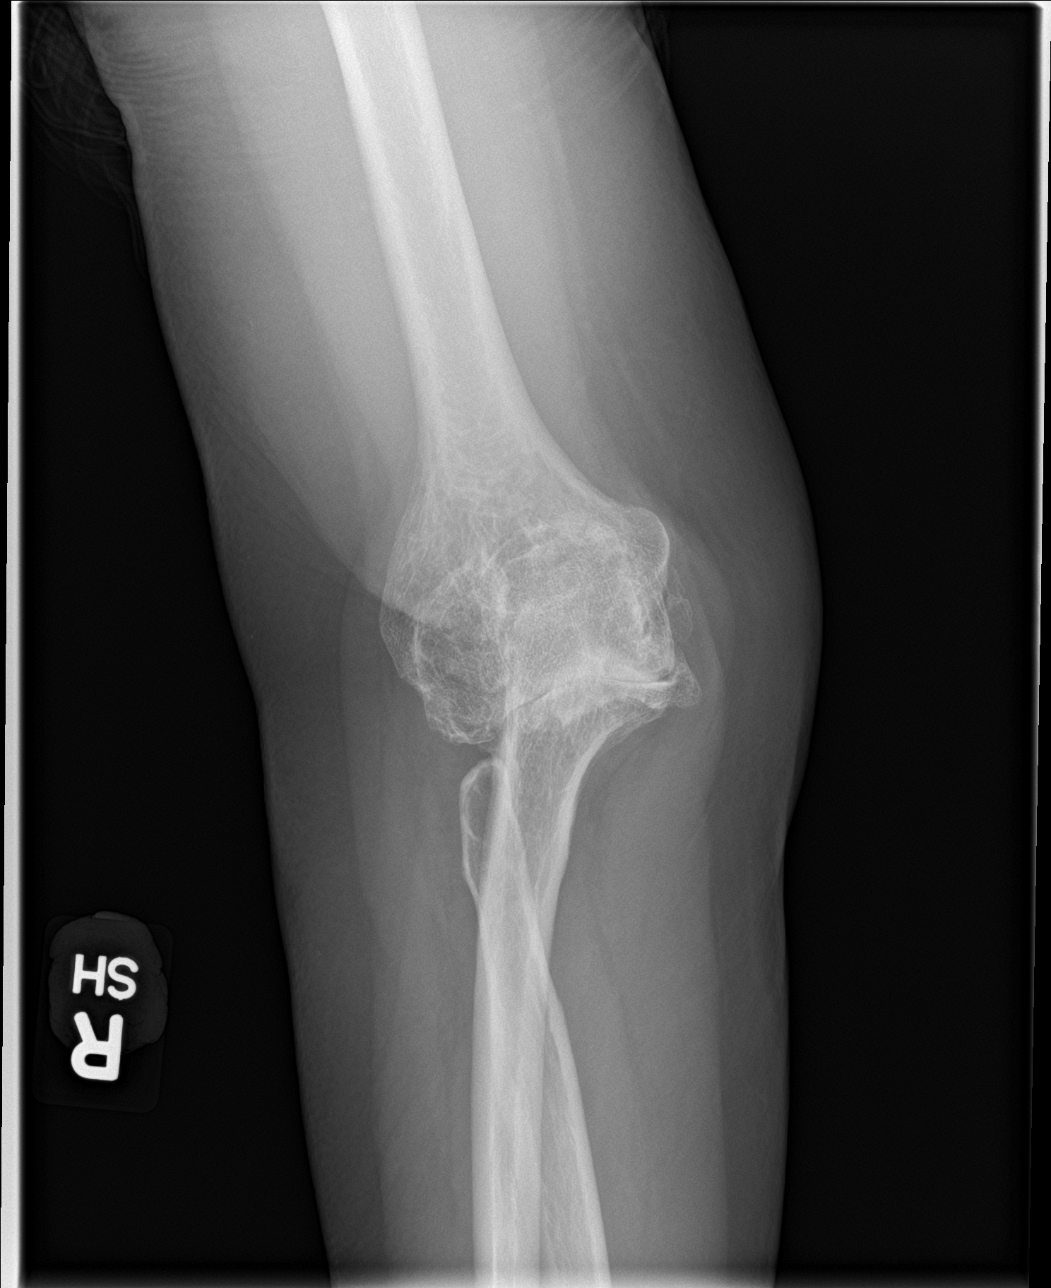

[elbow obl (2 of 2)]
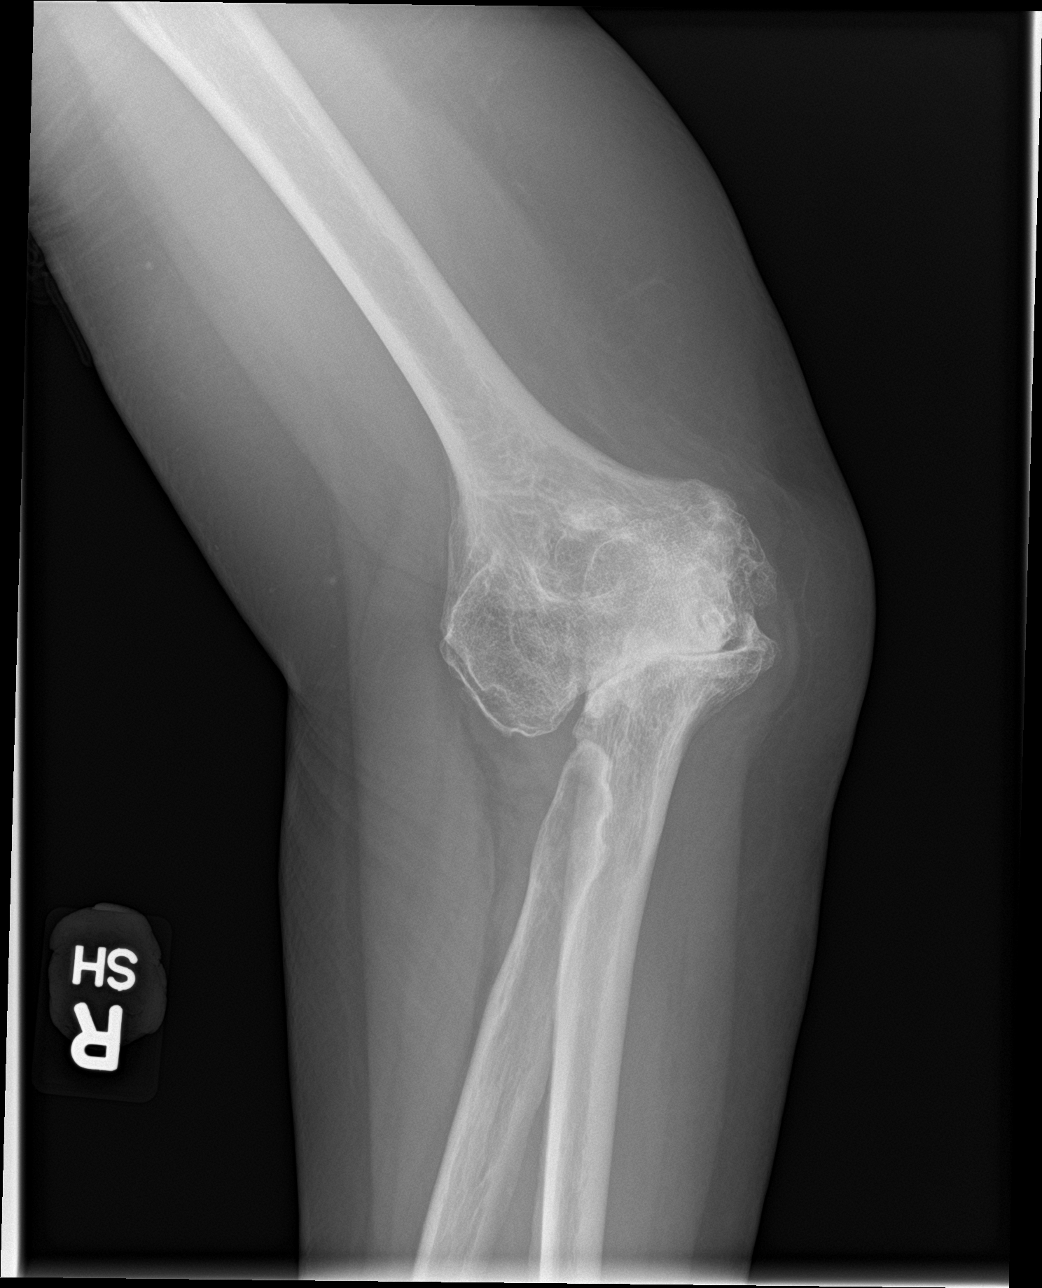

[elbow lat]
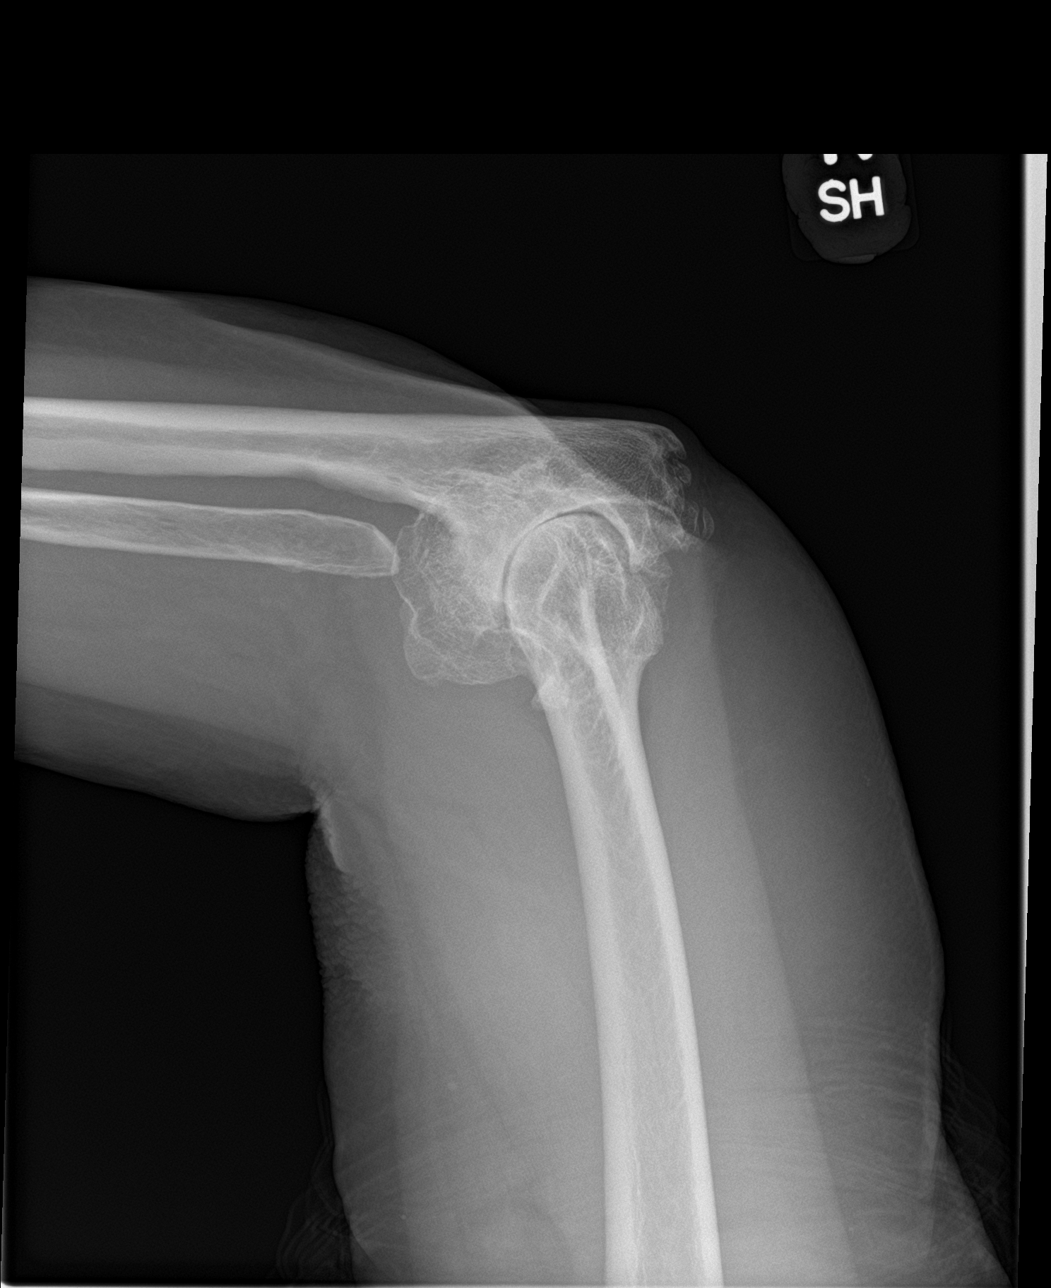

[4 of 4 positions shown; findings below may reference images not displayed]

FINDINGS: The patient appears to be status post prior resection of the radial
head. There are end-stage degenerative changes of the elbow joint
with significant productive changes at the level of the coronoid
process. There is some mild surrounding soft tissue swelling and a
probable small joint effusion. There is no acute displaced fracture
or dislocation.
IMPRESSION: End-stage degenerative changes of the elbow.

## 2020-12-13 ENCOUNTER — Ambulatory Visit (HOSPITAL_BASED_OUTPATIENT_CLINIC_OR_DEPARTMENT_OTHER)
Admission: RE | Admit: 2020-12-13 | Discharge: 2020-12-13 | Disposition: A | Discharge status: Home / Self Care | Payer: Medicare Other | Source: Ambulatory Visit | Attending: Family Medicine | Admitting: Family Medicine

## 2020-12-13 ENCOUNTER — Ambulatory Visit (INDEPENDENT_AMBULATORY_CARE_PROVIDER_SITE_OTHER): Payer: Medicare Other | Admitting: Family Medicine

## 2020-12-13 ENCOUNTER — Other Ambulatory Visit: Payer: Self-pay

## 2020-12-13 ENCOUNTER — Encounter: Payer: Self-pay | Admitting: Family Medicine

## 2020-12-13 ENCOUNTER — Ambulatory Visit: Payer: Self-pay

## 2020-12-13 VITALS — BP 128/90 | Ht 65.0 in | Wt 174.0 lb

## 2020-12-13 DIAGNOSIS — S93602A Unspecified sprain of left foot, initial encounter: Secondary | ICD-10-CM | POA: Diagnosis present

## 2020-12-13 DIAGNOSIS — M79672 Pain in left foot: Secondary | ICD-10-CM

## 2020-12-13 NOTE — Patient Instructions (Signed)
Good to see you Please try ice  Please avoid walking barefoot   Please send me a message in MyChart with any questions or updates.  Please see me back in 4 weeks. .   --Dr. Jordan Likes

## 2020-12-13 NOTE — Assessment & Plan Note (Signed)
Injury occurred this past weekend.  Ultrasound was reassuring.  Seems more like a sprain than the fracture today. -Counseled on home exercise therapy and supportive care. -Provided scaphoid pad.   - Counseled on avoiding walking barefoot. -X-ray. -May need to consider postop shoe.

## 2020-12-13 NOTE — Progress Notes (Signed)
Ann Hobbs - 75 y.o. female MRN [id number]  Date of birth: 1946/08/21  SUBJECTIVE:  Including CC & ROS.  No chief complaint on file.   Ann Hobbs is a 75 y.o. female that is presenting with acute left foot pain.  She had a large seeds fall onto her midfoot.  She was working in yard yesterday and the pain got significantly worse yesterday and this morning.  No history of similar pain.   Review of Systems See HPI   HISTORY: Past Medical, Surgical, Social, and Family History Reviewed & Updated per EMR.   Pertinent Historical Findings include:  Past Medical History:  Diagnosis Date  . A-fib (HCC)   . Brain lesion   . Diabetes mellitus without complication (HCC)   . Dizziness   . Elevated glucose   . Essential hypertension 04/25/2016  . Fall   . Hematoma   . Hyperlipidemia 04/25/2016  . Hypertension   . Paroxysmal atrial fibrillation (HCC) 04/25/2016  . PONV (postoperative nausea and vomiting)   . Typical atrial flutter (HCC) 04/25/2016  . Weakness     Past Surgical History:  Procedure Laterality Date  . APPENDECTOMY    . ATRIAL FIBRILLATION ABLATION N/A 05/30/2018   Procedure: ATRIAL FIBRILLATION ABLATION;  Surgeon: Hillis Range, MD;  [facility];  Service: Cardiovascular;  Laterality: N/A;  . CARDIOVERSION N/A 02/05/2018   Procedure: CARDIOVERSION;  Surgeon: Wendall Stade, MD;  Location: [facility] ENDOSCOPY;  Service: Cardiovascular;  Laterality: N/A;  . ELBOW FRACTURE SURGERY    . KNEE ARTHROSCOPY    . PARTIAL HYSTERECTOMY      Family History  Problem Relation Age of Onset  . Heart disease Mother        PACER  . Macular degeneration Mother   . Cancer Father   . Diabetes Sister   . Heart disease Maternal Grandmother     Social History   Socioeconomic History  . Marital status: Married    Spouse name: Not on file  . Number of children: Not on file  . Years of education: Not on file  . Highest education level: Not on file   Occupational History  . Not on file  Tobacco Use  . Smoking status: Never Smoker  . Smokeless tobacco: Never Used  Vaping Use  . Vaping Use: Never used  Substance and Sexual Activity  . Alcohol use: Yes    Comment: occasional social  . Drug use: Never  . Sexual activity: Not on file  Other Topics Concern  . Not on file  Social History Narrative   Lives with spouse   Retired Personal assistant   Social Determinants of Health   Financial Resource Strain: Not on file  Food Insecurity: Not on file  Transportation Needs: Not on file  Physical Activity: Not on file  Stress: Not on file  Social Connections: Not on file  Intimate Partner Violence: Not on file     PHYSICAL EXAM:  VS: BP 128/90 (BP Location: Left Arm, Patient Position: Sitting, Cuff Size: Normal)   Ht 5\' 5"  (1.651 m)   Wt 174 lb (78.9 kg)   BMI 28.96 kg/m  Physical Exam Gen: NAD, alert, cooperative with exam, well-appearing MSK:  Left foot: Some ecchymosis over the dorsum of the midfoot. Normal range of motion. Tenderness palpation of the midfoot. Neurovascularly intact  Limited ultrasound: Left foot:  No changes at the base of the first, second or third metatarsal. Degenerative changes appreciated the midfoot of  the Lisfranc joint. Increased hyperemia throughout the Lisfranc joint.  Summary: Findings of recent trauma but no significant structural changes.  Ultrasound and interpretation by Clare Gandy, MD    ASSESSMENT & PLAN:   Foot sprain, left, initial encounter Injury occurred this past weekend.  Ultrasound was reassuring.  Seems more like a sprain than the fracture today. -Counseled on home exercise therapy and supportive care. -Provided scaphoid pad.   - Counseled on avoiding walking barefoot. -X-ray. -May need to consider postop shoe.

## 2020-12-14 ENCOUNTER — Telehealth: Payer: Self-pay | Admitting: Family Medicine

## 2020-12-14 NOTE — Telephone Encounter (Signed)
Left VM for patient. If she calls back please have her speak with a nurse/CMA and inform that her xray does not show a fracture. We will need to continue to support her foot since she likely has a bone bruise. She can use the pad and tennis shoes if her pain is controlled.   If any questions then please take the best time and phone number to call and I will try to call her back.   Myra Rude, MD [facility] 12/14/2020, 11:12 AM

## 2020-12-20 ENCOUNTER — Ambulatory Visit: Payer: Medicare Other | Admitting: Internal Medicine

## 2021-01-03 ENCOUNTER — Ambulatory Visit (INDEPENDENT_AMBULATORY_CARE_PROVIDER_SITE_OTHER): Payer: Medicare Other | Admitting: Internal Medicine

## 2021-01-03 ENCOUNTER — Other Ambulatory Visit: Payer: Self-pay

## 2021-01-03 VITALS — BP 140/76 | HR 103 | Ht 65.0 in | Wt 173.8 lb

## 2021-01-03 DIAGNOSIS — I483 Typical atrial flutter: Secondary | ICD-10-CM | POA: Diagnosis not present

## 2021-01-03 DIAGNOSIS — I493 Ventricular premature depolarization: Secondary | ICD-10-CM

## 2021-01-03 DIAGNOSIS — I480 Paroxysmal atrial fibrillation: Secondary | ICD-10-CM | POA: Diagnosis not present

## 2021-01-03 DIAGNOSIS — E785 Hyperlipidemia, unspecified: Secondary | ICD-10-CM | POA: Diagnosis not present

## 2021-01-03 NOTE — Patient Instructions (Addendum)
Medication Instructions:  Your physician recommends that you continue on your current medications as directed. Please refer to the Current Medication list given to you today.  Labwork: None ordered.  Testing/Procedures: None ordered.  Follow-Up: Your physician wants you to follow-up in: Afib Clinic in 6 months. They will contact you to schedule.    Any Other Special Instructions Will Be Listed Below (If Applicable).  If you need a refill on your cardiac medications before your next appointment, please call your pharmacy.

## 2021-01-03 NOTE — Progress Notes (Signed)
Primary EP: Dr Abran Cantor Valdivia is a 75 y.o. female who presents today for routine electrophysiology followup.  Since last being seen in our clinic, the patient reports doing very well.  Today, she denies symptoms of palpitations, chest pain, shortness of breath,  lower extremity edema, dizziness, presyncope, or syncope. She continues to have moderate swelling from prior fall and R hip hematoma.  This is slowly improving.    The patient is otherwise without complaint today.   Past Medical History:  Diagnosis Date  . A-fib ([place])   . Brain lesion   . Diabetes mellitus without complication ([place])   . Dizziness   . Elevated glucose   . Essential hypertension 04/25/2016  . Fall   . Hematoma   . Hyperlipidemia 04/25/2016  . Hypertension   . Paroxysmal atrial fibrillation ([place]) 04/25/2016  . PONV (postoperative nausea and vomiting)   . Typical atrial flutter ([place]) 04/25/2016  . Weakness    Past Surgical History:  Procedure Laterality Date  . APPENDECTOMY    . ATRIAL FIBRILLATION ABLATION N/A 05/30/2018   Procedure: ATRIAL FIBRILLATION ABLATION;  Surgeon: Thompson Grayer, MD;  Location: [place] CV LAB;  Service: Cardiovascular;  Laterality: N/A;  . CARDIOVERSION N/A 02/05/2018   Procedure: CARDIOVERSION;  Surgeon: Josue Hector, MD;  Location: [facility] ENDOSCOPY;  Service: Cardiovascular;  Laterality: N/A;  . ELBOW FRACTURE SURGERY    . KNEE ARTHROSCOPY    . PARTIAL HYSTERECTOMY      ROS- all systems are reviewed and negatives except as per HPI above  Current Outpatient Medications  Medication Sig Dispense Refill  . Acetaminophen (ACETAMIN PO) Take 650 mg by mouth.    Marland Kitchen atorvastatin (LIPITOR) 20 MG tablet TAKE ONE (1) TABLET BY MOUTH EACH DAY AT6PM 90 tablet 3  . Blood Glucose Monitoring Suppl (GHT BLOOD GLUCOSE MONITOR) w/Device KIT Apply 1 strip topically as directed.    . Calcium Carb-Cholecalciferol 600-800 MG-UNIT TABS Take 2 tablets by mouth 2 (two) times daily.     .  clobetasol cream (TEMOVATE) 6.50 % Apply 1 application topically 2 (two) times daily.    Marland Kitchen estradiol (ESTRACE) 0.1 MG/GM vaginal cream Place 1 Applicatorful vaginally at bedtime.    . Multiple Vitamins-Minerals (WOMENS MULTIVITAMIN PLUS PO) Take 1 tablet by mouth daily.    . ReliOn Ultra Thin Lancets MISC Apply 1 strip topically as directed.     No current facility-administered medications for this visit.    Physical Exam: Vitals:   01/03/21 1053  BP: 140/76  Pulse: (!) 103  SpO2: 96%  Weight: 173 lb 12.8 oz (78.8 kg)  Height: $Remove'5\' 5"'jCuVCXs$  (1.651 m)    GEN- The patient is well appearing, alert and oriented x 3 today.   Head- normocephalic, atraumatic Eyes-  Sclera clear, conjunctiva pink Ears- hearing intact Oropharynx- clear Lungs- Clear to ausculation bilaterally, normal work of breathing Heart- Regular rate and rhythm with ectopy, no murmurs, rubs or gallops, PMI not laterally displaced GI- soft, NT, ND, + BS Extremities- no clubbing, cyanosis, or edema  Wt Readings from Last 3 Encounters:  01/03/21 173 lb 12.8 oz (78.8 kg)  12/13/20 174 lb (78.9 kg)  06/21/20 174 lb 3.2 oz (79 kg)   Echo /29/21- EF 55%  EKG tracing ordered today is personally reviewed and shows sinus with PVCs  Assessment and Plan:  1. Paroxysmal atrial fibrillation Doing well post ablation No symptoms of afib kardiamobile strips are reviewed and reveal only sinus with PVCs  chads2vasc score is 3.  She developed R hip hematoma previously with [place] and does not wish to resume.  Could consider watchman eventually  2. PVCs Asymptomatic Could consider beta blocker therapy.  She is not interested in additional medicines at this time. In the absence of symptoms or reduced EF, no indication for ablation.  3. HL Continue statin  Follow-up in the AF clinic in 6 months  Thompson Grayer MD, [facility] 01/03/2021 11:02 AM

## 2021-06-27 ENCOUNTER — Ambulatory Visit: Payer: Medicare Other | Admitting: Orthopaedic Surgery

## 2021-07-04 ENCOUNTER — Other Ambulatory Visit: Payer: Self-pay

## 2021-07-04 ENCOUNTER — Ambulatory Visit (HOSPITAL_COMMUNITY)
Admission: RE | Admit: 2021-07-04 | Discharge: 2021-07-04 | Disposition: A | Discharge status: Home / Self Care | Payer: Medicare Other | Source: Ambulatory Visit | Attending: Nurse Practitioner | Admitting: Nurse Practitioner

## 2021-07-04 ENCOUNTER — Encounter (HOSPITAL_COMMUNITY): Payer: Self-pay | Admitting: Nurse Practitioner

## 2021-07-04 VITALS — BP 134/82 | HR 87 | Ht 65.0 in | Wt 165.0 lb

## 2021-07-04 DIAGNOSIS — S7001XS Contusion of right hip, sequela: Secondary | ICD-10-CM | POA: Diagnosis not present

## 2021-07-04 DIAGNOSIS — W19XXXS Unspecified fall, sequela: Secondary | ICD-10-CM | POA: Diagnosis not present

## 2021-07-04 DIAGNOSIS — Z79899 Other long term (current) drug therapy: Secondary | ICD-10-CM | POA: Insufficient documentation

## 2021-07-04 DIAGNOSIS — Z09 Encounter for follow-up examination after completed treatment for conditions other than malignant neoplasm: Secondary | ICD-10-CM | POA: Insufficient documentation

## 2021-07-04 DIAGNOSIS — Z888 Allergy status to other drugs, medicaments and biological substances status: Secondary | ICD-10-CM | POA: Insufficient documentation

## 2021-07-04 DIAGNOSIS — Z8249 Family history of ischemic heart disease and other diseases of the circulatory system: Secondary | ICD-10-CM | POA: Diagnosis not present

## 2021-07-04 DIAGNOSIS — Z885 Allergy status to narcotic agent status: Secondary | ICD-10-CM | POA: Insufficient documentation

## 2021-07-04 DIAGNOSIS — Z79890 Hormone replacement therapy (postmenopausal): Secondary | ICD-10-CM | POA: Diagnosis not present

## 2021-07-04 DIAGNOSIS — I480 Paroxysmal atrial fibrillation: Secondary | ICD-10-CM | POA: Diagnosis not present

## 2021-07-04 NOTE — Progress Notes (Signed)
Primary Care Physician: Iona Hansen, NP Referring Physician: Dr. Eileen Stanford Luckenbach is a 75 y.o. female with a h/o paroxysmal afib s/p ablation 9/22. She is here for f/u afib.  S/p afib ablation 2019.  Had a prior fall /hip hematoma mentioned on last visit with Dr. Johney Frame and she did not want to resume anticoagulation with CHA2DS2VASc  of 3. He mentioned  that Watchman could be a possibility at a later date if needed. He states today, that she has not had any sustained afib. She is in SR today with PAC's. She reports that she recently had more f/u for what was thought all along of a persistent hematoma of her rt hip, but now, her latest evaluation points away form a hematoma and maybe a muscle tear. She will need further imaging.    Today, she denies symptoms of palpitations, chest pain, shortness of breath, orthopnea, PND, lower extremity edema, dizziness, presyncope, syncope, or neurologic sequela. The patient is tolerating medications without difficulties and is otherwise without complaint today.   Past Medical History:  Diagnosis Date   A-fib [facility])    Brain lesion    Diabetes mellitus without complication (HCC)    Dizziness    Elevated glucose    Essential hypertension 04/25/2016   Fall    Hematoma    Hyperlipidemia 04/25/2016   Hypertension    Paroxysmal atrial fibrillation (HCC) 04/25/2016   PONV (postoperative nausea and vomiting)    Typical atrial flutter (HCC) 04/25/2016   Weakness    Past Surgical History:  Procedure Laterality Date   APPENDECTOMY     ATRIAL FIBRILLATION ABLATION N/A 05/30/2018   Procedure: ATRIAL FIBRILLATION ABLATION;  Surgeon: Hillis Range, MD;  Location: [facility] INVASIVE CV LAB;  Service: Cardiovascular;  Laterality: N/A;   CARDIOVERSION N/A 02/05/2018   Procedure: CARDIOVERSION;  Surgeon: Wendall Stade, MD;  Location: [facility] ENDOSCOPY;  Service: Cardiovascular;  Laterality: N/A;   ELBOW FRACTURE SURGERY     KNEE ARTHROSCOPY     PARTIAL  HYSTERECTOMY      Current Outpatient Medications  Medication Sig Dispense Refill   Acetaminophen (ACETAMIN PO) Take 650 mg by mouth.     atorvastatin (LIPITOR) 20 MG tablet TAKE ONE (1) TABLET BY MOUTH EACH DAY AT6PM 90 tablet 3   Blood Glucose Monitoring Suppl (GHT BLOOD GLUCOSE MONITOR) w/Device KIT Apply 1 strip topically as directed.     Calcium Carb-Cholecalciferol 600-800 MG-UNIT TABS Take 2 tablets by mouth 2 (two) times daily.      clobetasol cream (TEMOVATE) 0.05 % Apply 1 application topically 2 (two) times daily.     estradiol (ESTRACE) 0.1 MG/GM vaginal cream Place 1 Applicatorful vaginally at bedtime.     Multiple Vitamins-Minerals (WOMENS MULTIVITAMIN PLUS PO) Take 1 tablet by mouth daily.     ReliOn Ultra Thin Lancets MISC Apply 1 strip topically as directed.     No current facility-administered medications for this encounter.    Allergies  Allergen Reactions   Codeine Nausea And Vomiting   Prednisone Other (See Comments)    Made patient overly jittery, Can tolerate in low doses for short periods of time    Social History   Socioeconomic History   Marital status: Married    Spouse name: Not on file   Number of children: Not on file   Years of education: Not on file   Highest education level: Not on file  Occupational History   Not on file  Tobacco Use  Smoking status: Never   Smokeless tobacco: Never  Vaping Use   Vaping Use: Never used  Substance and Sexual Activity   Alcohol use: Yes    Comment: occasional social   Drug use: Never   Sexual activity: Not on file  Other Topics Concern   Not on file  Social History Narrative   Lives with spouse   Retired Surveyor, quantity   Social Determinants of Radio broadcast assistant Strain: Not on file  Food Insecurity: Not on file  Transportation Needs: Not on file  Physical Activity: Not on file  Stress: Not on file  Social Connections: Not on file  Intimate Partner Violence: Not on file     Family History  Problem Relation Age of Onset   Heart disease Mother        PACER   Macular degeneration Mother    Cancer Father    Diabetes Sister    Heart disease Maternal Grandmother     ROS- All systems are reviewed and negative except as per the HPI above  Physical Exam: There were no vitals filed for this visit.  Wt Readings from Last 3 Encounters:  01/03/21 78.8 kg  12/13/20 78.9 kg  06/21/20 79 kg    Labs: Lab Results  Component Value Date   NA 143 05/06/2018   K 4.8 05/06/2018   CL 104 05/06/2018   CO2 25 05/06/2018   GLUCOSE 93 05/06/2018   BUN 18 05/06/2018   CREATININE 0.86 05/06/2018   CALCIUM 9.8 05/06/2018   MG 2.2 01/27/2018   No results found for: INR No results found for: CHOL, HDL, LDLCALC, TRIG   GEN- The patient is well appearing, alert and oriented x 3 today.   Head- normocephalic, atraumatic Eyes-  Sclera clear, conjunctiva pink Ears- hearing intact Oropharynx- clear Neck- supple, no JVP Lymph- no cervical lymphadenopathy Lungs- Clear to ausculation bilaterally, normal work of breathing Heart- Regular rate and rhythm, no murmurs, rubs or gallops, PMI not laterally displaced GI- soft, NT, ND, + BS Extremities- no clubbing, cyanosis, or edema, fist size mass rt groin  MS- no significant deformity or atrophy Skin- no rash or lesion Psych- euthymic mood, full affect Neuro- strength and sensation are intact  EKG-NSR at 85 bpm, Pr int 150 ms, qrs int 76 ms, qtc 464 ms  Epic records reviewed    Assessment and Plan: 1. Afib  S/p ablation 2019 She is doing well, no afib burden described   2. CHA2DS2VASc score of at least 3 Pt wishes at this time not to be on anticoagulation   F/u with in afib clinic in 6 months   Butch Penny C. Fernandez Kenley, [facility] [address] [place], [place] [address] [phone]

## 2021-07-11 ENCOUNTER — Encounter: Payer: Self-pay | Admitting: Orthopaedic Surgery

## 2021-07-11 ENCOUNTER — Other Ambulatory Visit: Payer: Self-pay

## 2021-07-11 ENCOUNTER — Ambulatory Visit (INDEPENDENT_AMBULATORY_CARE_PROVIDER_SITE_OTHER): Payer: Medicare Other | Admitting: Orthopaedic Surgery

## 2021-07-11 DIAGNOSIS — G8929 Other chronic pain: Secondary | ICD-10-CM | POA: Diagnosis not present

## 2021-07-11 DIAGNOSIS — M25562 Pain in left knee: Secondary | ICD-10-CM | POA: Diagnosis not present

## 2021-07-11 MED ORDER — METHYLPREDNISOLONE ACETATE 40 MG/ML IJ SUSP
40.0000 mg | INTRAMUSCULAR | Status: AC | PRN
  Administered 2021-07-11: 40 mg via INTRA_ARTICULAR

## 2021-07-11 MED ORDER — LIDOCAINE HCL 1 % IJ SOLN
3.0000 mL | INTRAMUSCULAR | Status: AC | PRN
  Administered 2021-07-11: 3 mL

## 2021-07-11 NOTE — Progress Notes (Signed)
Office Visit Note   Patient: Ann Hobbs           Date of Birth: 1946/03/26           MRN: [id number] Visit Date: 07/11/2021              Requested by: Iona Hansen, NP [address] [place],  [place] [address] PCP: Iona Hansen, NP   Assessment & Plan: Visit Diagnoses:  1. Chronic pain of left knee     Plan: In order to temporize her pain I did place a steroid injection in her left knee.  At this point I would like to obtain the left knee MRI to really assess the cartilage of the knee and look for any new meniscal tear.  This will help guide our treatment plan in terms of whether or not to recommend knee replacement versus another arthroscopic intervention.  Follow-Up Instructions: Return in about 2 weeks (around 07/25/2021).   Orders:  Orders Placed This Encounter  Procedures   Large Joint Inj    No orders of the defined types were placed in this encounter.     Procedures: Large Joint Inj: L knee on 07/11/2021 10:42 AM Indications: diagnostic evaluation and pain Details: 22 G 1.5 in needle, superolateral approach  Arthrogram: No  Medications: 3 mL lidocaine 1 %; 40 mg methylPREDNISolone acetate 40 MG/ML Outcome: tolerated well, no immediate complications Procedure, treatment alternatives, risks and benefits explained, specific risks discussed. Consent was given by the patient. Immediately prior to procedure a time out was called to verify the correct patient, procedure, equipment, support staff and site/side marked as required. Patient was prepped and draped in the usual sterile fashion.      Clinical Data: No additional findings.   Subjective: Chief Complaint  Patient presents with   Left Knee - Pain  The patient is a 75 year old female that comes in for evaluation treatment of worsening chronic left knee pain.  We have actually seen her for that knee before and performed arthroscopic surgery on her remotely.  It is now becoming more  global pain.  In 2020 that showed varus malalignment of the knee with medial joint space narrowing.  She has medial and lateral pain with that knee and it does swell on her and it gives away with locking and catching.  She is had no other acute change in her medical status.  She is on blood thinners but they are on hold due to recent fall where she injured her right hip and has a hematoma of the right hip.  She walks without an assistive device.  She is alert and orient x3 and in no acute distress.  HPI  Review of Systems She currently denies headache, chest pain, shortness of breath, fever, chills, nausea, vomiting  Objective: Vital Signs: There were no vitals taken for this visit.  Physical Exam  Ortho Exam Examination of her left knee shows varus malalignment.  There is global tenderness with patellofemoral crepitation and a positive Murray sign to the medial compartment of her left knee. Specialty Comments:  No specialty comments available.  Imaging: No results found.   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   Paroxysmal atrial  fibrillation (HCC) 05/30/2018   Positive colorectal cancer screening using DNA-based stool test 12/19/2016   Low bone mass 12/04/2016   Atrial fibrillation (HCC) 04/25/2016   Atrial flutter (HCC) 04/25/2016   Hyperlipidemia 04/25/2016   History of diabetes mellitus 05/20/2015   Mixed hyperlipidemia 05/20/2015   Essential hypertension 02/17/2015   Palpitations 10/17/2013   Heart murmur 10/15/2013   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/15/2013   Past Medical History:  Diagnosis Date   A-fib [facility])    Brain lesion    Diabetes mellitus without complication (HCC)     Dizziness    Elevated glucose    Essential hypertension 04/25/2016   Fall    Hematoma    Hyperlipidemia 04/25/2016   Hypertension    Paroxysmal atrial fibrillation (HCC) 04/25/2016   PONV (postoperative nausea and vomiting)    Typical atrial flutter (HCC) 04/25/2016   Weakness     Family History  Problem Relation Age of Onset   Heart disease Mother        PACER   Macular degeneration Mother    Cancer Father    Diabetes Sister    Heart disease Maternal Grandmother     Past Surgical History:  Procedure Laterality Date   APPENDECTOMY     ATRIAL FIBRILLATION ABLATION N/A 05/30/2018   Procedure: ATRIAL FIBRILLATION ABLATION;  Surgeon: Hillis Range, MD;  Location: [facility] INVASIVE CV LAB;  Service: Cardiovascular;  Laterality: N/A;   CARDIOVERSION N/A 02/05/2018   Procedure: CARDIOVERSION;  Surgeon: Wendall Stade, MD;  Location: [facility] ENDOSCOPY;  Service: Cardiovascular;  Laterality: N/A;   ELBOW FRACTURE SURGERY     KNEE ARTHROSCOPY     PARTIAL HYSTERECTOMY     Social History   Occupational History   Not on file  Tobacco Use   Smoking status: Never   Smokeless tobacco: Never  Vaping Use   Vaping Use: Never used  Substance and Sexual Activity   Alcohol use: Yes    Alcohol/week: 1.0 standard drink    Types: 1 Standard drinks or equivalent per week    Comment: occasional social   Drug use: Never   Sexual activity: Not on file

## 2021-07-12 ENCOUNTER — Other Ambulatory Visit: Payer: Self-pay

## 2021-07-12 DIAGNOSIS — G8929 Other chronic pain: Secondary | ICD-10-CM

## 2021-07-12 DIAGNOSIS — M25562 Pain in left knee: Secondary | ICD-10-CM

## 2021-08-01 ENCOUNTER — Ambulatory Visit: Payer: Medicare Other | Admitting: Orthopaedic Surgery

## 2021-08-02 ENCOUNTER — Other Ambulatory Visit: Payer: Self-pay

## 2021-08-02 ENCOUNTER — Ambulatory Visit
Admission: RE | Admit: 2021-08-02 | Discharge: 2021-08-02 | Disposition: A | Discharge status: Home / Self Care | Payer: Medicare Other | Source: Ambulatory Visit | Attending: Orthopaedic Surgery | Admitting: Orthopaedic Surgery

## 2021-08-02 DIAGNOSIS — M25562 Pain in left knee: Secondary | ICD-10-CM

## 2021-08-02 DIAGNOSIS — G8929 Other chronic pain: Secondary | ICD-10-CM

## 2021-08-04 ENCOUNTER — Encounter: Payer: Self-pay | Admitting: Physician Assistant

## 2021-08-04 ENCOUNTER — Ambulatory Visit (INDEPENDENT_AMBULATORY_CARE_PROVIDER_SITE_OTHER): Payer: Medicare Other | Admitting: Physician Assistant

## 2021-08-04 DIAGNOSIS — R2241 Localized swelling, mass and lump, right lower limb: Secondary | ICD-10-CM | POA: Diagnosis not present

## 2021-08-04 DIAGNOSIS — M1712 Unilateral primary osteoarthritis, left knee: Secondary | ICD-10-CM | POA: Diagnosis not present

## 2021-08-04 NOTE — Progress Notes (Signed)
Office Visit Note   Patient: Ann Hobbs           Date of Birth: May 02, 1946           MRN: [id number] Visit Date: 08/04/2021              Requested by: Iona Hansen, NP [address] [place],  [place] [address] PCP: Iona Hansen, NP   Assessment & Plan: Visit Diagnoses:  1. Primary osteoarthritis of left knee   2. Hip mass, right     Plan: Recommend supplemental injection left knee.  We will also send her to physical therapy to work on quad strengthening developed a home exercise program and modalities.  Regards to the lower leg edema recommend compression hose.  The right hip mass/form in the anterior lateral aspect needs further work-up with MRI to rule out muscle tear versus fluid accumulation.  We will order this and have her follow-up with Dr. Magnus Ivan afterwards.  This being an MR I with and without contrast of the right hip.  Patient was evaluated by Dr. Magnus Ivan today.  Questions encouraged and answered  Follow-Up Instructions: Return After MRI, for Supplemental injection.   Orders:  No orders of the defined types were placed in this encounter.  No orders of the defined types were placed in this encounter.     Procedures: No procedures performed   Clinical Data: No additional findings.   Subjective: Chief Complaint  Patient presents with   Left Knee - Pain, Follow-up    HPI Ann Hobbs comes in today to review her MRI of her left knee.  She states that the left knee injection done on 07/11/2021 helped for short period of time but her pain is came back.  Most her pain is located anterior aspect of the knee.  She is unable to take NSAIDs due to the fact she is on chronic anticoagulation.  She states that Biofreeze does not help with her knee pain.  She states that the pain in her knee keeps her from doing things she enjoys like cooking and gardening due to increased pain in the knee.  She notes stiffness.  She is also having some swelling  both lower legs states she has had this for a long time and that her mother had swelling both legs.  She is having no calf pain.  MRI images are reviewed with the patient.  Shows tricompartmental arthritic changes with full-thickness cartilage loss involving the medial femoral-tibial compartment.  Reactive marrow edema changes seen.  Lateral compartment with partial thickness cartilage loss.  Patellofemoral joint with partial cartilage loss.  Small radial tear seen free edge of the medial meniscus lateral meniscus with a small partial radial tear posterior horn.  Patient also ask about her right hip.  She has decreased range of motion of the hip since a fall in July 2021.  She reports that she fell through the floor of a house that was being renovated and fell onto her right hip.  She reports that she was told initially that she had a hematoma reports that she had a blood transfusion due to the amount of blood she lost due to the hematoma into her right hip.  She also reports that she had an ultrasound done on the right hip and was told that she may have a muscle tear at this point in time.  She has pain and an obvious deformity over the anterior lateral aspect of her  right hip.  Review of Systems See HPI otherwise negative  Objective: Vital Signs: There were no vitals taken for this visit.  Physical Exam Constitutional:      Appearance: She is not ill-appearing or diaphoretic.  Pulmonary:     Effort: Pulmonary effort is normal.  Neurological:     Mental Status: She is alert and oriented to person, place, and time.  Psychiatric:        Mood and Affect: Mood normal.    Ortho Exam Right hip good range of motion.  She has large mass over the anterior lateral aspect of her hip there is no abnormal warmth erythema in this region.  No evidence of fluid collection.  Unable to cross her right leg over her left due weakness on the right.  Able to perform this easily with the left leg over the right  leg actively. Specialty Comments:  No specialty comments available.  Imaging: No results found.   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   Paroxysmal atrial fibrillation (HCC) 05/30/2018   Positive colorectal cancer screening using DNA-based stool test 12/19/2016   Low bone mass 12/04/2016   Atrial fibrillation (HCC) 04/25/2016   Atrial flutter (HCC) 04/25/2016   Hyperlipidemia 04/25/2016   History of diabetes mellitus 05/20/2015   Mixed hyperlipidemia 05/20/2015   Essential hypertension 02/17/2015   Palpitations 10/17/2013   Heart murmur 10/15/2013   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/15/2013   Past Medical History:  Diagnosis Date   A-fib [facility])    Brain lesion    Diabetes mellitus without complication (HCC)    Dizziness    Elevated glucose    Essential hypertension 04/25/2016   Fall    Hematoma    Hyperlipidemia 04/25/2016   Hypertension    Paroxysmal atrial fibrillation (HCC) 04/25/2016   PONV (postoperative nausea and vomiting)    Typical atrial flutter (HCC) 04/25/2016   Weakness     Family History  Problem Relation Age of Onset   Heart disease Mother        PACER   Macular degeneration Mother    Cancer Father    Diabetes Sister    Heart disease Maternal Grandmother     Past Surgical History:  Procedure Laterality Date   APPENDECTOMY     ATRIAL FIBRILLATION ABLATION N/A 05/30/2018   Procedure: ATRIAL FIBRILLATION ABLATION;  Surgeon: Hillis Range, MD;  Location: [facility] INVASIVE CV LAB;  Service: Cardiovascular;  Laterality: N/A;   CARDIOVERSION N/A 02/05/2018   Procedure: CARDIOVERSION;  Surgeon: Wendall Stade, MD;  Location: [facility] ENDOSCOPY;  Service: Cardiovascular;  Laterality:  N/A;   ELBOW FRACTURE SURGERY     KNEE ARTHROSCOPY     PARTIAL HYSTERECTOMY     Social History   Occupational History   Not on file  Tobacco Use   Smoking status: Never   Smokeless tobacco: Never  Vaping Use   Vaping Use: Never used  Substance and Sexual Activity   Alcohol use: Yes    Alcohol/week: 1.0 standard drink    Types: 1 Standard drinks or equivalent per week    Comment: occasional social   Drug use: Never   Sexual activity: Not on file

## 2021-08-05 ENCOUNTER — Telehealth: Payer: Self-pay

## 2021-08-05 ENCOUNTER — Other Ambulatory Visit: Payer: Self-pay | Admitting: Internal Medicine

## 2021-08-05 NOTE — Telephone Encounter (Signed)
Please get auth for left knee gel injection-gil pt 

## 2021-08-05 NOTE — Addendum Note (Signed)
Addended by: Fredda Hammed L on: 08/05/2021 11:06 AM   Modules accepted: Orders

## 2021-08-08 ENCOUNTER — Telehealth: Payer: Self-pay

## 2021-08-08 NOTE — Telephone Encounter (Signed)
Noted  

## 2021-08-08 NOTE — Telephone Encounter (Signed)
VOB has been submitted for Euflexxa, left knee. Pending BV.

## 2021-08-25 ENCOUNTER — Other Ambulatory Visit: Payer: Self-pay

## 2021-08-25 ENCOUNTER — Encounter: Payer: Self-pay | Admitting: Physical Therapy

## 2021-08-25 ENCOUNTER — Ambulatory Visit: Payer: Medicare Other | Attending: Physician Assistant | Admitting: Physical Therapy

## 2021-08-25 DIAGNOSIS — R600 Localized edema: Secondary | ICD-10-CM | POA: Diagnosis present

## 2021-08-25 DIAGNOSIS — M1712 Unilateral primary osteoarthritis, left knee: Secondary | ICD-10-CM | POA: Diagnosis not present

## 2021-08-25 DIAGNOSIS — G8929 Other chronic pain: Secondary | ICD-10-CM | POA: Diagnosis present

## 2021-08-25 DIAGNOSIS — M6281 Muscle weakness (generalized): Secondary | ICD-10-CM | POA: Insufficient documentation

## 2021-08-25 DIAGNOSIS — M25562 Pain in left knee: Secondary | ICD-10-CM | POA: Insufficient documentation

## 2021-08-25 NOTE — Therapy (Signed)
[facility] [address]  [place], [place], [address] Phone: [phone]   Fax:  [phone]  Physical Therapy Evaluation  Patient Details  Name: Ann Hobbs MRN: [id number] Date of Birth: May 12, 1946 Referring Provider (PT): Erskine Emery [facility]   Encounter Date: 08/25/2021   PT End of Session - 08/25/21 0939     Visit Number 1    Date for PT Re-Evaluation 10/06/21    Authorization Type MCR    Progress Note Due on Visit 10    PT Start Time 0939    PT Stop Time 1015    PT Time Calculation (min) 36 min    Activity Tolerance Patient tolerated treatment well    Behavior During Therapy [facility] for tasks assessed/performed             Past Medical History:  Diagnosis Date   A-fib [facility])    Brain lesion    Diabetes mellitus without complication ([place])    Dizziness    Elevated glucose    Essential hypertension 04/25/2016   Fall    Hematoma    Hyperlipidemia 04/25/2016   Hypertension    Paroxysmal atrial fibrillation ([place]) 04/25/2016   PONV (postoperative nausea and vomiting)    Typical atrial flutter ([place]) 04/25/2016   Weakness     Past Surgical History:  Procedure Laterality Date   APPENDECTOMY     ATRIAL FIBRILLATION ABLATION N/A 05/30/2018   Procedure: ATRIAL FIBRILLATION ABLATION;  Surgeon: Thompson Grayer, MD;  Location: [facility] CV LAB;  Service: Cardiovascular;  Laterality: N/A;   CARDIOVERSION N/A 02/05/2018   Procedure: CARDIOVERSION;  Surgeon: Josue Hector, MD;  Location: [facility] ENDOSCOPY;  Service: Cardiovascular;  Laterality: N/A;   ELBOW FRACTURE SURGERY     KNEE ARTHROSCOPY     PARTIAL HYSTERECTOMY      There were no vitals filed for this visit.    Subjective Assessment - 08/25/21 0940     Subjective Patient has a lot of problems with her left knee swelling and then it hurts. She reports having a hard time getting in/out of bathtub and has trouble getting up from the floor. Riding in  the car is horrible. It swells and gets stiff. This has worsened since her scope for torn meniscus several years ago and it has never fully recovered.    Pertinent History T2DM controlled by diet    How long can you walk comfortably? 2 hours    Diagnostic tests MRI Lt knee: Small medial meniscus tear medial body; small tear posterior horn lateral meniscus    Patient Stated Goals to decrease pain and swelling    Currently in Pain? No/denies    Pain Location Knee    Pain Orientation Left;Medial;Lateral   medial joint line; laterally distal ITB   Pain Descriptors / Indicators Dull    Pain Type Chronic pain    Pain Radiating Towards ITB    Pain Frequency Intermittent    Aggravating Factors  prolonged walking                [facility] PT Assessment - 08/25/21 0001       Assessment   Medical Diagnosis primary OA left knee    Referring Provider (PT) Erskine Emery [facility]    Onset Date/Surgical Date 06/30/21    Hand Dominance Right    Prior Therapy yes      Precautions   Precautions None      Restrictions   Weight Bearing  Restrictions No      Balance Screen   Has the patient fallen in the past 6 months No    Has the patient had a decrease in activity level because of a fear of falling?  No    Is the patient reluctant to leave their home because of a fear of falling?  No      Home Environment   Living Environment Private residence    Living Arrangements Spouse/significant other    Home Access Level entry    [place] One level      Prior Function   Level of [place] Retired      Observation/Other Assessments-Edema    Edema --   mild swelling at medial joint line and distal lateral femur     Functional Tests   Functional tests Squat;Step up;Step down;Single leg stance      Squat   Comments shifts toward Rt LE      Step Up   Comments WNL      Step Down   Comments WNL; heel tap not assessed      Single Leg Stance   Comments Rt 20 sec; Lt 10  sec      Posture/Postural Control   Posture Comments genu varum bil      ROM / Strength   AROM / PROM / Strength AROM;Strength      AROM   Overall AROM Comments left knee 0-134 deg, bil hips and right knee WNL      Strength   Overall Strength Comments Bil hip ext 4-/5, Rt flex 4-/5, Lt flex 4/5; bil knee ext 5/5, Rt flex 4/5, Lt 4+/5      Flexibility   Soft Tissue Assessment /Muscle Length yes    Hamstrings WFL mild tightness at end range lt; tight right gastroc/soleus    Quadriceps tight bil    ITB left tight    Piriformis WNL      Palpation   Patella mobility decreased medial glide    Palpation comment tender at left medial joint line and lateral femur/distal ITB                        Objective measurements completed on examination: See above findings.                PT Education - 08/25/21 1235     Education Details HEP, POC    Person(s) Educated Patient    Methods Explanation;Demonstration;Handout    Comprehension Verbalized understanding;Returned demonstration                 PT Long Term Goals - 08/25/21 1249       PT LONG TERM GOAL #1   Title Patient to be independent with advanced HEP.    Time 6    Period Weeks    Status New    Target Date 10/06/21      PT LONG TERM GOAL #2   Title Improved bil hip and knee strength to 5/5 and improved form with functional squats.    Time 6    Period Weeks    Status New      PT LONG TERM GOAL #3   Title Pt able to enter/exit tub without difficulty and minimal left knee pain.    Time 6    Period Weeks    Status New      PT LONG TERM GOAL #4   Title Patient  able to perform floor to stand transfer without UE assistance.    Time 6    Period Weeks    Status New      PT LONG TERM GOAL #5   Title Patient to demonstrate improved SLS on the Lt LE to equal right.    Time 6    Period Weeks    Status New                    Plan - 08/25/21 1235     Clinical Impression  Statement Patient presents with c/o of left knee pain and swelling which is chronic of nature but has worsened over the past few months. She has had injections in the past most recently on 07/11/21. Her MRI shows tears in the med and lateral meniscus, but MD does not want to do surgery. She has pain with prolonged WB, getting in/out of tub, getting off the floor and when riding in the car for longer periods. She has functional ROM, but has tightness at end range ext and flexion. She is tight in her left quads, ITB and HS and in her right gastroc/soleus. She has weakness in bil hips and hamstrings. Functionally, her SLS on the left is 10 sec vs. 20 sec on the right; her functional squat shifts right and her step ups were WNL. Eccentric quad strength should be assessed with stairs next visit. Additionally FOTO was not completed as pt arrived late for appt. She will benefit from skilled PT to address these deficits.    Personal Factors and Comorbidities Comorbidity 2    Comorbidities DM, Afib    Examination-Activity Limitations Locomotion Level    Stability/Clinical Decision Making Stable/Uncomplicated    Clinical Decision Making Low    Rehab Potential Excellent    PT Frequency 2x / week    PT Duration 6 weeks    PT Treatment/Interventions ADLs/Self Care Home Management;Cryotherapy;Electrical Stimulation;Iontophoresis 4mg /ml Dexamethasone;Moist Heat;Neuromuscular re-education;Balance training;Therapeutic exercise;Therapeutic activities;Functional mobility training;Stair training;Patient/family education;Manual techniques;Dry needling;Taping;Vasopneumatic Device    PT Next Visit Plan complete FOTO, assess eccentric quad strength, work on functional strength (squats, stairs, lunges, SLS activites), floor to stand transfers    PT Home Exercise Plan [email or web address]    Consulted and Agree with Plan of Care Patient             Patient will benefit from skilled therapeutic intervention in order to improve the  following deficits and impairments:  Pain, Impaired flexibility, Decreased strength, Increased edema, Decreased balance, Decreased activity tolerance  Visit Diagnosis: Chronic pain of left knee  Muscle weakness (generalized)  Localized edema     Problem List Patient Active Problem List   Diagnosis Date Noted   Foot sprain, left, initial encounter 12/13/2020   Chronic anticoagulation 05/07/2020   Non-insulin dependent type 2 diabetes mellitus (HCC) 05/07/2020   Orthostatic hypotension 05/07/2020   Hematoma 05/07/2020   Degenerative arthritis of right elbow 12/15/2019   Paroxysmal atrial fibrillation (HCC) 05/30/2018   Positive colorectal cancer screening using DNA-based stool test 12/19/2016   Low bone mass 12/04/2016   Atrial fibrillation (HCC) 04/25/2016   Atrial flutter (HCC) 04/25/2016   Hyperlipidemia 04/25/2016   History of diabetes mellitus 05/20/2015   Mixed hyperlipidemia 05/20/2015   Essential hypertension 02/17/2015   Palpitations 10/17/2013   Heart murmur 10/15/2013   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/15/2013    12/13/2013, PT 08/25/2021, 1:01 PM  [facility] [address]  [address] [place], [place],  [place] Phone: [phone]   Fax:  [phone]  Name: Exodus Mccrea Dildine MRN: [id number] Date of Birth: 1946-08-02

## 2021-08-25 NOTE — Patient Instructions (Signed)
Access Code: [email or web address] URL: [email or web address] Date: 08/25/2021 Prepared by: Raynelle Fanning  Exercises Supine Hamstring Stretch with Strap - 2 x daily - 7 x weekly - 1 sets - 3 reps - 60 sec hold Supine ITB Stretch with Strap - 2 x daily - 7 x weekly - 1 sets - 3 reps - 60 sec hold Prone Quadriceps Stretch with Strap - 2-3 x daily - 7 x weekly - 1 sets - 3 reps - 60 sec hold Supine Bridge - 2 x daily - 7 x weekly - 1-3 sets - 10 reps

## 2021-08-26 ENCOUNTER — Ambulatory Visit
Admission: RE | Admit: 2021-08-26 | Discharge: 2021-08-26 | Disposition: A | Discharge status: Home / Self Care | Payer: Medicare Other | Source: Ambulatory Visit | Attending: Physician Assistant | Admitting: Physician Assistant

## 2021-08-26 DIAGNOSIS — R2241 Localized swelling, mass and lump, right lower limb: Secondary | ICD-10-CM

## 2021-08-26 MED ORDER — GADOBENATE DIMEGLUMINE 529 MG/ML IV SOLN
14.0000 mL | Freq: Once | INTRAVENOUS | Status: AC | PRN
  Administered 2021-08-26: 14 mL via INTRAVENOUS

## 2021-08-29 ENCOUNTER — Other Ambulatory Visit: Payer: Self-pay

## 2021-08-29 ENCOUNTER — Ambulatory Visit (INDEPENDENT_AMBULATORY_CARE_PROVIDER_SITE_OTHER): Payer: Medicare Other | Admitting: Orthopaedic Surgery

## 2021-08-29 ENCOUNTER — Encounter: Payer: Self-pay | Admitting: Orthopaedic Surgery

## 2021-08-29 ENCOUNTER — Telehealth: Payer: Self-pay

## 2021-08-29 DIAGNOSIS — G8929 Other chronic pain: Secondary | ICD-10-CM

## 2021-08-29 DIAGNOSIS — M1712 Unilateral primary osteoarthritis, left knee: Secondary | ICD-10-CM

## 2021-08-29 DIAGNOSIS — M25562 Pain in left knee: Secondary | ICD-10-CM

## 2021-08-29 DIAGNOSIS — R2241 Localized swelling, mass and lump, right lower limb: Secondary | ICD-10-CM

## 2021-08-29 NOTE — Telephone Encounter (Signed)
Noted  

## 2021-08-29 NOTE — Telephone Encounter (Signed)
Left knee gel injection ?

## 2021-08-29 NOTE — Progress Notes (Signed)
The patient comes in for follow-up for 2 different issues today.  She has been dealing with chronic left knee pain for some time and has a remote history of an arthroscopic intervention of that left knee as well as steroid injection in the left knee.  We sent her for an MRI to evaluate the meniscus and the cartilage of her left knee.  She is very active 75 year old female.  She also has a chronic right hip mass over the proximal femur and trochanteric area.  This has been present for over a year after hard mechanical fall.  She says the knee is feeling a little bit better with the steroid injection.  The MRI of her left knee shows areas of full-thickness cartilage loss in the medial compartment of her knee and partial cartilage loss on the lateral compartment.  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  She does report that the right hip mass does bother her quite a bit with laying on that side.  Fortunately it is benign but I agree with having it excised given the size of the mass.  I did explain in detail what the surgery would involve as an outpatient.  I described the risk and benefits of surgery as well for that type of mass.  She would like to have this performed after the first of the year.  From a knee standpoint for her left knee, I have recommended hyaluronic acid to treat the pain from osteoarthritis of the left knee.  We will work on ordering that and she agrees with this treatment plan for her left knee.  We will see her back soon hopefully for hyaluronic acid injection left knee and again we will schedule for a right hip mass excision after the first of the year.

## 2021-09-07 ENCOUNTER — Ambulatory Visit: Payer: Medicare Other | Admitting: Physical Therapy

## 2021-09-07 ENCOUNTER — Other Ambulatory Visit: Payer: Self-pay

## 2021-09-07 ENCOUNTER — Encounter: Payer: Self-pay | Admitting: Physical Therapy

## 2021-09-07 DIAGNOSIS — M25562 Pain in left knee: Secondary | ICD-10-CM | POA: Diagnosis not present

## 2021-09-07 DIAGNOSIS — R600 Localized edema: Secondary | ICD-10-CM

## 2021-09-07 DIAGNOSIS — M6281 Muscle weakness (generalized): Secondary | ICD-10-CM

## 2021-09-07 DIAGNOSIS — G8929 Other chronic pain: Secondary | ICD-10-CM

## 2021-09-07 NOTE — Therapy (Signed)
[facility] [address]  [place], [place], [address] Phone: [phone]   Fax:  [phone]  Physical Therapy Treatment  Patient Details  Name: Ann Hobbs MRN: [id number] Date of Birth: 03-22-1946 Referring Provider (PT): Erskine Emery [facility]   Encounter Date: 09/07/2021   PT End of Session - 09/07/21 1544     Visit Number 2    Date for PT Re-Evaluation 10/06/21    Authorization Type MCR    Progress Note Due on Visit 10    PT Start Time [id number]    PT Stop Time 1620    PT Time Calculation (min) 40 min    Activity Tolerance Patient tolerated treatment well    Behavior During Therapy [facility] for tasks assessed/performed             Past Medical History:  Diagnosis Date   A-fib [facility])    Brain lesion    Diabetes mellitus without complication ([place])    Dizziness    Elevated glucose    Essential hypertension 04/25/2016   Fall    Hematoma    Hyperlipidemia 04/25/2016   Hypertension    Paroxysmal atrial fibrillation ([place]) 04/25/2016   PONV (postoperative nausea and vomiting)    Typical atrial flutter ([place]) 04/25/2016   Weakness     Past Surgical History:  Procedure Laterality Date   APPENDECTOMY     ATRIAL FIBRILLATION ABLATION N/A 05/30/2018   Procedure: ATRIAL FIBRILLATION ABLATION;  Surgeon: Thompson Grayer, MD;  Location: [place] CV LAB;  Service: Cardiovascular;  Laterality: N/A;   CARDIOVERSION N/A 02/05/2018   Procedure: CARDIOVERSION;  Surgeon: Josue Hector, MD;  Location: [facility] ENDOSCOPY;  Service: Cardiovascular;  Laterality: N/A;   ELBOW FRACTURE SURGERY     KNEE ARTHROSCOPY     PARTIAL HYSTERECTOMY      There were no vitals filed for this visit.   Subjective Assessment - 09/07/21 1542     Subjective Patient reports that she is doing a lot of home improvement (climbing ladders, prolonged standing to paint, scrape, etc) which her knee does not like and has a lot of swelling at the end of  the day.  She has not done her HEP yet, since she was given it right before thanksgiving and had no time with company and preparations etc.    Pertinent History T2DM controlled by diet    How long can you walk comfortably? 2 hours    Diagnostic tests MRI Lt knee: Small medial meniscus tear medial body; small tear posterior horn lateral meniscus    Patient Stated Goals to decrease pain and swelling    Currently in Pain? Yes    Pain Score 1     Pain Location Knee    Pain Orientation Left                [facility] PT Assessment - 09/07/21 0001       Observation/Other Assessments   Focus on Therapeutic Outcomes (FOTO)  26   55 precicted after 12 visits                          [facility] Adult PT Treatment/Exercise - 09/07/21 0001       Exercises   Exercises Knee/Hip      Knee/Hip Exercises: Stretches   Active Hamstring Stretch 1 rep;30 seconds;Left    Active Hamstring Stretch Limitations supine with strap, no tightness noted so d/c'd from HEP  Quad Stretch 3 reps;30 seconds;Left    Quad Stretch Limitations prone with strap    ITB Stretch 3 reps;30 seconds;Left    ITB Stretch Limitations supine with strap      Knee/Hip Exercises: Aerobic   Nustep L4 x 6 min      Knee/Hip Exercises: Supine   Bridges Strengthening;10 reps    Straight Leg Raises Strengthening;Right;10 reps    Straight Leg Raises Limitations reported pain on r side from hematoma present over R hip flexor, so to perform on L only      Manual Therapy   Manual Therapy Soft tissue mobilization    Manual therapy comments supine to L knee to decrease pain and improve ROM    Soft tissue mobilization IASTM with stainless steel tools to L quad, L ITB distal attachment, L pes anserine                          PT Long Term Goals - 09/07/21 1804       PT LONG TERM GOAL #1   Title Patient to be independent with advanced HEP.    Time 6    Period Weeks    Status On-going    Target Date  10/06/21      PT LONG TERM GOAL #2   Title Improved bil hip and knee strength to 5/5 and improved form with functional squats.    Time 6    Period Weeks    Status On-going    Target Date 10/06/21      PT LONG TERM GOAL #3   Title Pt able to enter/exit tub without difficulty and minimal left knee pain.    Time 6    Period Weeks    Status On-going    Target Date 10/06/21      PT LONG TERM GOAL #4   Title Patient able to perform floor to stand transfer without UE assistance.    Time 6    Period Weeks    Status On-going    Target Date 10/06/21      PT LONG TERM GOAL #5   Title Patient to demonstrate improved SLS on the Lt LE to equal right.    Time 6    Period Weeks    Status On-going    Target Date 10/06/21                   Plan - 09/07/21 1801     Clinical Impression Statement Patient reports no change since initial evaluation and has not had chance to perform HEP.  Focus of today's session was review and progression of HEP.  Advised to purchase inexpensive dog leash that could be used as strap to assist with stretches.  She tolerated all exercises well with exception of SLR on Right side due to presence of hematoma over hip flexor.  Manual therapy at end of session to decrease tightness and improve ROM.  Patient responded well and reported feeling better after therapy than arrival.  Would benefit from continued skilled therapy.    Personal Factors and Comorbidities Comorbidity 2    Comorbidities DM, Afib    Examination-Activity Limitations Locomotion Level    Stability/Clinical Decision Making Stable/Uncomplicated    Rehab Potential Excellent    PT Frequency 2x / week    PT Duration 6 weeks    PT Treatment/Interventions ADLs/Self Care Home Management;Cryotherapy;Electrical Stimulation;Iontophoresis 4mg /ml Dexamethasone;Moist Heat;Neuromuscular re-education;Balance training;Therapeutic exercise;Therapeutic activities;Functional mobility training;Stair  training;Patient/family education;Manual  techniques;Dry needling;Taping;Vasopneumatic Device    PT Next Visit Plan Work functional strength (squats, stairs, lunges, SLS activites), floor to stand transfers, modalities and manual therapy PRN.  consider ionto patch or dry needling.    PT Home Exercise Plan [email or web address]    Consulted and Agree with Plan of Care Patient             Patient will benefit from skilled therapeutic intervention in order to improve the following deficits and impairments:  Pain, Impaired flexibility, Decreased strength, Increased edema, Decreased balance, Decreased activity tolerance  Visit Diagnosis: Chronic pain of left knee  Muscle weakness (generalized)  Localized edema     Problem List Patient Active Problem List   Diagnosis Date Noted   Primary osteoarthritis of left knee 08/29/2021   Hip mass, right 08/29/2021   Foot sprain, left, initial encounter 12/13/2020   Chronic anticoagulation 05/07/2020   Non-insulin dependent type 2 diabetes mellitus ([place]) 05/07/2020   Orthostatic hypotension 05/07/2020   Hematoma 05/07/2020   Degenerative arthritis of right elbow 12/15/2019   Paroxysmal atrial fibrillation ([place]) 05/30/2018   Positive colorectal cancer screening using DNA-based stool test 12/19/2016   Low bone mass 12/04/2016   Atrial fibrillation ([place]) 04/25/2016   Atrial flutter ([place]) 04/25/2016   Hyperlipidemia 04/25/2016   History of diabetes mellitus 05/20/2015   Mixed hyperlipidemia 05/20/2015   Essential hypertension 02/17/2015   Palpitations 10/17/2013   Heart murmur 10/15/2013   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/15/2013    Rennie Natter, PT, DPT 09/07/2021, 6:07 PM  [facility] [address]  [place], [place], [address] Phone: [phone]   Fax:  [phone]  Name: Rosell Clouser Chovanec MRN: [id number] Date of Birth: May 28, 1946

## 2021-09-13 ENCOUNTER — Other Ambulatory Visit: Payer: Self-pay

## 2021-09-13 ENCOUNTER — Ambulatory Visit: Payer: Medicare Other | Attending: Physician Assistant

## 2021-09-13 DIAGNOSIS — M6281 Muscle weakness (generalized): Secondary | ICD-10-CM | POA: Insufficient documentation

## 2021-09-13 DIAGNOSIS — M25562 Pain in left knee: Secondary | ICD-10-CM | POA: Insufficient documentation

## 2021-09-13 DIAGNOSIS — R600 Localized edema: Secondary | ICD-10-CM | POA: Diagnosis present

## 2021-09-13 DIAGNOSIS — G8929 Other chronic pain: Secondary | ICD-10-CM | POA: Diagnosis present

## 2021-09-13 NOTE — Patient Instructions (Signed)
Access Code: [email or web address] URL: [email or web address] Date: 09/13/2021 Prepared by: Verta Ellen  Exercises Supine ITB Stretch with Strap - 2 x daily - 7 x weekly - 1 sets - 3 reps - 30 sec hold Prone Quadriceps Stretch with Strap - 2-3 x daily - 7 x weekly - 1 sets - 3 reps - 30 sec hold Supine Active Straight Leg Raise - 1 x daily - 7 x weekly - 2 sets - 10 reps Supine Bridge with Resistance Band - 1 x daily - 7 x weekly - 2 sets - 10 reps Hooklying Isometric Clamshell - 1 x daily - 7 x weekly - 2 sets - 10 reps - 3 sec hold

## 2021-09-13 NOTE — Therapy (Signed)
[facility] [address]  [address] [place], [place], [address] Phone: [phone]   Fax:  [phone]  Physical Therapy Treatment  Patient Details  Name: Roger KillSherry J Binstock MRN: [id number] Date of Birth: 20-Nov-1945 Referring Provider (PT): Richardean CanalGilbert Damario [facility]   Encounter Date: 09/13/2021   PT End of Session - 09/13/21 1015     Visit Number 3    Date for PT Re-Evaluation 10/06/21    Authorization Type MCR    Progress Note Due on Visit 10    PT Start Time [phone]   pt late   PT Stop Time 1014    PT Time Calculation (min) 37 min    Activity Tolerance Patient tolerated treatment well    Behavior During Therapy [facility] for tasks assessed/performed             Past Medical History:  Diagnosis Date   A-fib [facility])    Brain lesion    Diabetes mellitus without complication ([facility])    Dizziness    Elevated glucose    Essential hypertension 04/25/2016   Fall    Hematoma    Hyperlipidemia 04/25/2016   Hypertension    Paroxysmal atrial fibrillation ([facility]) 04/25/2016   PONV (postoperative nausea and vomiting)    Typical atrial flutter (HCC) 04/25/2016   Weakness     Past Surgical History:  Procedure Laterality Date   APPENDECTOMY     ATRIAL FIBRILLATION ABLATION N/A 05/30/2018   Procedure: ATRIAL FIBRILLATION ABLATION;  Surgeon: Hillis RangeAllred, James, MD;  Location[facility] CV LAB;  Service: Cardiovascular;  Laterality: N/A;   CARDIOVERSION N/A 02/05/2018   Procedure: CARDIOVERSION;  Surgeon: Wendall StadeNishan, Peter C, MD;  [facility];  Service: Cardiovascular;  Laterality: N/A;   ELBOW FRACTURE SURGERY     KNEE ARTHROSCOPY     PARTIAL HYSTERECTOMY      There were no vitals filed for this visit.   Subjective Assessment - 09/13/21 0939     Subjective Pt reports that she has been doing a lot of housework, most notably was on the floor cleaning.    Pertinent History T2DM controlled by diet    Diagnostic tests MRI Lt knee: Small  medial meniscus tear medial body; small tear posterior horn lateral meniscus    Patient Stated Goals to decrease pain and swelling    Currently in Pain? No/denies                               [facility] 09/13/21 0001       Knee/Hip Exercises: Stretches   Active Hamstring Stretch 1 rep;30 seconds;Left    Active Hamstring Stretch Limitations seated    ITB Stretch Left;2 reps;30 seconds    ITB Stretch Limitations supine with strap      Knee/Hip Exercises: Aerobic   Recumbent Bike L1x726min      Knee/Hip Exercises: Standing   Lateral Step Up Left;10 reps;Step Height: 4"    Lateral Step Up Limitations cues for control when lowering    Forward Step Up Left;10 reps;Hand Hold: 1;Step Height: 4"    Functional Squat 10 reps    Functional Squat Limitations UE support, cues for hip hinge      Knee/Hip Exercises: Seated   Long Arc Quad AROM;Both;10 reps      Knee/Hip Exercises: Supine   Bridges with Clamshell Strengthening;Both;10 reps   red TB   Other Supine Knee/Hip Exercises  clamshell with red TB10x3"                     PT Education - 09/13/21 1002     Education Details HEP update: [email or web address]    Person(s) Educated Patient    Methods Explanation;Demonstration;Handout    Comprehension Verbalized understanding;Returned demonstration                 PT Long Term Goals - 09/07/21 1804       PT LONG TERM GOAL #1   Title Patient to be independent with advanced HEP.    Time 6    Period Weeks    Status On-going    Target Date 10/06/21      PT LONG TERM GOAL #2   Title Improved bil hip and knee strength to 5/5 and improved form with functional squats.    Time 6    Period Weeks    Status On-going    Target Date 10/06/21      PT LONG TERM GOAL #3   Title Pt able to enter/exit tub without difficulty and minimal left knee pain.    Time 6    Period Weeks    Status On-going    Target Date 10/06/21      PT LONG TERM  GOAL #4   Title Patient able to perform floor to stand transfer without UE assistance.    Time 6    Period Weeks    Status On-going    Target Date 10/06/21      PT LONG TERM GOAL #5   Title Patient to demonstrate improved SLS on the Lt LE to equal right.    Time 6    Period Weeks    Status On-going    Target Date 10/06/21                   Plan - 09/13/21 1016     Clinical Impression Statement Deaira had a good response to the treatment today, no complaints of pain with any exercises. We progressed HEP with resistance bands for bridges and added supine clams. She showed some lack of control with the steps with coming down. Progressed to seated HS stretch for HEP. Gave cues with stretches not to push to pain. She did have reports of ITB tenderness which could be worked out with some manual next visit.    Personal Factors and Comorbidities Comorbidity 2    Comorbidities DM, Afib    PT Frequency 2x / week    PT Duration 6 weeks    PT Treatment/Interventions ADLs/Self Care Home Management;Cryotherapy;Electrical Stimulation;Iontophoresis 4mg /ml Dexamethasone;Moist Heat;Neuromuscular re-education;Balance training;Therapeutic exercise;Therapeutic activities;Functional mobility training;Stair training;Patient/family education;Manual techniques;Dry needling;Taping;Vasopneumatic Device    PT Next Visit Plan Work functional strength (squats, stairs, lunges, SLS activites), floor to stand transfers, modalities and manual therapy PRN.  consider ionto patch or dry needling.    PT Home Exercise Plan [email or web address]    Consulted and Agree with Plan of Care Patient             Patient will benefit from skilled therapeutic intervention in order to improve the following deficits and impairments:  Pain, Impaired flexibility, Decreased strength, Increased edema, Decreased balance, Decreased activity tolerance  Visit Diagnosis: Chronic pain of left knee  Muscle weakness (generalized)  Localized  edema     Problem List Patient Active Problem List   Diagnosis Date Noted   Primary osteoarthritis of left knee 08/29/2021   Hip mass, right 08/29/2021  Foot sprain, left, initial encounter 12/13/2020   Chronic anticoagulation 05/07/2020   Non-insulin dependent type 2 diabetes mellitus ([place]) 05/07/2020   Orthostatic hypotension 05/07/2020   Hematoma 05/07/2020   Degenerative arthritis of right elbow 12/15/2019   Paroxysmal atrial fibrillation ([place]) 05/30/2018   Positive colorectal cancer screening using DNA-based stool test 12/19/2016   Low bone mass 12/04/2016   Atrial fibrillation ([place]) 04/25/2016   Atrial flutter ([place]) 04/25/2016   Hyperlipidemia 04/25/2016   History of diabetes mellitus 05/20/2015   Mixed hyperlipidemia 05/20/2015   Essential hypertension 02/17/2015   Palpitations 10/17/2013   Heart murmur 10/15/2013   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/15/2013    Artist Pais, PTA 09/13/2021, 12:02 PM  [facility] [address]  [place], [place], [address] Phone: [phone]   Fax:  [phone]  Name: Carolette Delrio Outten MRN: [id number] Date of Birth: 10-09-1946

## 2021-09-15 ENCOUNTER — Ambulatory Visit: Payer: Medicare Other

## 2021-09-15 ENCOUNTER — Other Ambulatory Visit: Payer: Self-pay

## 2021-09-15 DIAGNOSIS — M6281 Muscle weakness (generalized): Secondary | ICD-10-CM

## 2021-09-15 DIAGNOSIS — G8929 Other chronic pain: Secondary | ICD-10-CM

## 2021-09-15 DIAGNOSIS — M25562 Pain in left knee: Secondary | ICD-10-CM | POA: Diagnosis not present

## 2021-09-15 DIAGNOSIS — R600 Localized edema: Secondary | ICD-10-CM

## 2021-09-15 NOTE — Therapy (Signed)
[facility] [address]  [place], [place], [address] Phone: [phone]   Fax:  [phone]  Physical Therapy Treatment  Patient Details  Name: Ann Hobbs MRN: [id number] Date of Birth: 07/02/1946 Referring Provider (PT): Erskine Emery [facility]   Encounter Date: 09/15/2021   PT End of Session - 09/15/21 1430     Visit Number 4    Date for PT Re-Evaluation 10/06/21    Authorization Type MCR    Progress Note Due on Visit 10    PT Start Time 1320    PT Stop Time 1415    PT Time Calculation (min) 55 min    Activity Tolerance Patient tolerated treatment well    Behavior During Therapy [facility] for tasks assessed/performed             Past Medical History:  Diagnosis Date   A-fib [facility])    Brain lesion    Diabetes mellitus without complication ([place])    Dizziness    Elevated glucose    Essential hypertension 04/25/2016   Fall    Hematoma    Hyperlipidemia 04/25/2016   Hypertension    Paroxysmal atrial fibrillation ([place]) 04/25/2016   PONV (postoperative nausea and vomiting)    Typical atrial flutter ([place]) 04/25/2016   Weakness     Past Surgical History:  Procedure Laterality Date   APPENDECTOMY     ATRIAL FIBRILLATION ABLATION N/A 05/30/2018   Procedure: ATRIAL FIBRILLATION ABLATION;  Surgeon: Thompson Grayer, MD;  Location: [place] CV LAB;  Service: Cardiovascular;  Laterality: N/A;   CARDIOVERSION N/A 02/05/2018   Procedure: CARDIOVERSION;  Surgeon: Josue Hector, MD;  Location: [facility] ENDOSCOPY;  Service: Cardiovascular;  Laterality: N/A;   ELBOW FRACTURE SURGERY     KNEE ARTHROSCOPY     PARTIAL HYSTERECTOMY      There were no vitals filed for this visit.   Subjective Assessment - 09/15/21 1324     Subjective Felt more swelling and soreness in her knee yesterday from cleaning her house for company coming in this weekend. Pain is better today.    Pertinent History T2DM controlled by diet     Diagnostic tests MRI Lt knee: Small medial meniscus tear medial body; small tear posterior horn lateral meniscus    Patient Stated Goals to decrease pain and swelling    Currently in Pain? No/denies                               [facility] Adult PT Treatment/Exercise - 09/15/21 0001       Exercises   Exercises Knee/Hip      Knee/Hip Exercises: Stretches   Active Hamstring Stretch Left;30 seconds    Active Hamstring Stretch Limitations seated    ITB Stretch Left;30 seconds    ITB Stretch Limitations supine with strap      Knee/Hip Exercises: Aerobic   Nustep L4 x 6 min      Knee/Hip Exercises: Standing   Forward Step Up Left;2 sets;10 reps;Step Height: 6";Hand Hold: 0      Knee/Hip Exercises: Seated   Sit to Sand 2 sets;10 reps;without UE support   cues to increase hip hinge     Knee/Hip Exercises: Supine   Heel Slides AROM;Left;10 reps    Straight Leg Raises Strengthening;Left;10 reps      Modalities   Modalities Cryotherapy      Cryotherapy   Number Minutes Cryotherapy 10  Minutes    Cryotherapy Location Knee    Type of Cryotherapy Ice pack      Manual Therapy   Manual Therapy Soft tissue mobilization;Myofascial release    Manual therapy comments in sitting    Soft tissue mobilization IASTM with rolling stick to lateral quads, ITB    Myofascial Release TPR to L lateral quads                     PT Education - 09/15/21 1430     Education Details HEP progression    Person(s) Educated Patient    Methods Explanation;Demonstration;Handout    Comprehension Verbalized understanding;Returned demonstration                 PT Long Term Goals - 09/07/21 1804       PT LONG TERM GOAL #1   Title Patient to be independent with advanced HEP.    Time 6    Period Weeks    Status On-going    Target Date 10/06/21      PT LONG TERM GOAL #2   Title Improved bil hip and knee strength to 5/5 and improved form with functional squats.    Time  6    Period Weeks    Status On-going    Target Date 10/06/21      PT LONG TERM GOAL #3   Title Pt able to enter/exit tub without difficulty and minimal left knee pain.    Time 6    Period Weeks    Status On-going    Target Date 10/06/21      PT LONG TERM GOAL #4   Title Patient able to perform floor to stand transfer without UE assistance.    Time 6    Period Weeks    Status On-going    Target Date 10/06/21      PT LONG TERM GOAL #5   Title Patient to demonstrate improved SLS on the Lt LE to equal right.    Time 6    Period Weeks    Status On-going    Target Date 10/06/21                   Plan - 09/15/21 1431     Clinical Impression Statement Worked on decreasing muscle tension on the lateral portion of the hip/knee at the beginning of session. More tenderness was noted along the VL today. She had an intial report of LBP with STS but after giving cues to increase hip hinge, pain resolved. Stressed the importance of using ice on her knees, especially since she has been doing more cleaning on her feet lately. She responded well to the treatment today. Ended session with CP to knee to decrease swelling.    Personal Factors and Comorbidities Comorbidity 2    Comorbidities DM, Afib    PT Frequency 2x / week    PT Duration 6 weeks    PT Treatment/Interventions ADLs/Self Care Home Management;Cryotherapy;Electrical Stimulation;Iontophoresis 4mg /ml Dexamethasone;Moist Heat;Neuromuscular re-education;Balance training;Therapeutic exercise;Therapeutic activities;Functional mobility training;Stair training;Patient/family education;Manual techniques;Dry needling;Taping;Vasopneumatic Device    PT Next Visit Plan Work functional strength (squats, stairs, lunges, SLS activites), floor to stand transfers, modalities and manual therapy PRN.  consider ionto patch or dry needling.    PT Home Exercise Plan [email or web address]    Consulted and Agree with Plan of Care Patient             Patient  will benefit from skilled therapeutic intervention in order  to improve the following deficits and impairments:  Pain, Impaired flexibility, Decreased strength, Increased edema, Decreased balance, Decreased activity tolerance  Visit Diagnosis: Chronic pain of left knee  Muscle weakness (generalized)  Localized edema     Problem List Patient Active Problem List   Diagnosis Date Noted   Primary osteoarthritis of left knee 08/29/2021   Hip mass, right 08/29/2021   Foot sprain, left, initial encounter 12/13/2020   Chronic anticoagulation 05/07/2020   Non-insulin dependent type 2 diabetes mellitus ([place]) 05/07/2020   Orthostatic hypotension 05/07/2020   Hematoma 05/07/2020   Degenerative arthritis of right elbow 12/15/2019   Paroxysmal atrial fibrillation ([place]) 05/30/2018   Positive colorectal cancer screening using DNA-based stool test 12/19/2016   Low bone mass 12/04/2016   Atrial fibrillation ([place]) 04/25/2016   Atrial flutter ([place]) 04/25/2016   Hyperlipidemia 04/25/2016   History of diabetes mellitus 05/20/2015   Mixed hyperlipidemia 05/20/2015   Essential hypertension 02/17/2015   Palpitations 10/17/2013   Heart murmur 10/15/2013   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/15/2013    Artist Pais, PTA 09/15/2021, 2:42 PM  [facility] [address]  [place], [place], [address] Phone: [phone]   Fax:  [phone]  Name: Ann Hobbs MRN: [id number] Date of Birth: [birth year]

## 2021-09-19 ENCOUNTER — Ambulatory Visit: Payer: Medicare Other

## 2021-09-19 ENCOUNTER — Other Ambulatory Visit: Payer: Self-pay

## 2021-09-19 DIAGNOSIS — R600 Localized edema: Secondary | ICD-10-CM

## 2021-09-19 DIAGNOSIS — M6281 Muscle weakness (generalized): Secondary | ICD-10-CM

## 2021-09-19 DIAGNOSIS — M25562 Pain in left knee: Secondary | ICD-10-CM | POA: Diagnosis not present

## 2021-09-19 DIAGNOSIS — G8929 Other chronic pain: Secondary | ICD-10-CM

## 2021-09-19 NOTE — Therapy (Signed)
[facility] [address]  [place], [place], [address] Phone: [phone]   Fax:  [phone]  Physical Therapy Treatment  Patient Details  Name: Ann Hobbs MRN: [id number] Date of Birth: 1945/12/14 Referring Provider (PT): Erskine Emery [facility]   Encounter Date: 09/19/2021   PT End of Session - 09/19/21 1021     Visit Number 5    Date for PT Re-Evaluation 10/06/21    Authorization Type MCR    Progress Note Due on Visit 10    PT Start Time [id number]   pt late   PT Stop Time 1012    PT Time Calculation (min) 34 min    Activity Tolerance Patient tolerated treatment well    Behavior During Therapy [facility] for tasks assessed/performed             Past Medical History:  Diagnosis Date   A-fib [facility])    Brain lesion    Diabetes mellitus without complication ([place])    Dizziness    Elevated glucose    Essential hypertension 04/25/2016   Fall    Hematoma    Hyperlipidemia 04/25/2016   Hypertension    Paroxysmal atrial fibrillation ([place]) 04/25/2016   PONV (postoperative nausea and vomiting)    Typical atrial flutter ([place]) 04/25/2016   Weakness     Past Surgical History:  Procedure Laterality Date   APPENDECTOMY     ATRIAL FIBRILLATION ABLATION N/A 05/30/2018   Procedure: ATRIAL FIBRILLATION ABLATION;  Surgeon: Thompson Grayer, MD;  Location: [place] CV LAB;  Service: Cardiovascular;  Laterality: N/A;   CARDIOVERSION N/A 02/05/2018   Procedure: CARDIOVERSION;  Surgeon: Josue Hector, MD;  Location: [facility] ENDOSCOPY;  Service: Cardiovascular;  Laterality: N/A;   ELBOW FRACTURE SURGERY     KNEE ARTHROSCOPY     PARTIAL HYSTERECTOMY      There were no vitals filed for this visit.   Subjective Assessment - 09/19/21 0940     Subjective Was a little sore yesterday on the inner part of her knee from doing exercises.    Pertinent History T2DM controlled by diet    Diagnostic tests MRI Lt knee: Small medial meniscus  tear medial body; small tear posterior horn lateral meniscus    Patient Stated Goals to decrease pain and swelling    Currently in Pain? No/denies                               [facility] Adult PT Treatment/Exercise - 09/19/21 0001       Knee/Hip Exercises: Aerobic   Recumbent Bike L2x59min      Knee/Hip Exercises: Machines for Strengthening   Cybex Leg Press 15lb 10 reps      Knee/Hip Exercises: Standing   Knee Flexion AROM;Both;10 reps    Knee Flexion Limitations HS curls, UE support    Hip Abduction AROM;Both;10 reps;Knee straight    Abduction Limitations UE support    Functional Squat 10 reps    Functional Squat Limitations UE support      Knee/Hip Exercises: Seated   Long Arc Quad Strengthening;Both;10 reps    Long Arc Quad Limitations with ball squeeze      Knee/Hip Exercises: Supine   Bridges with Clamshell Strengthening;Both;10 reps;2 sets   green TB   Other Supine Knee/Hip Exercises clamshell with green TB10x3"  PT Long Term Goals - 09/07/21 1804       PT LONG TERM GOAL #1   Title Patient to be independent with advanced HEP.    Time 6    Period Weeks    Status On-going    Target Date 10/06/21      PT LONG TERM GOAL #2   Title Improved bil hip and knee strength to 5/5 and improved form with functional squats.    Time 6    Period Weeks    Status On-going    Target Date 10/06/21      PT LONG TERM GOAL #3   Title Pt able to enter/exit tub without difficulty and minimal left knee pain.    Time 6    Period Weeks    Status On-going    Target Date 10/06/21      PT LONG TERM GOAL #4   Title Patient able to perform floor to stand transfer without UE assistance.    Time 6    Period Weeks    Status On-going    Target Date 10/06/21      PT LONG TERM GOAL #5   Title Patient to demonstrate improved SLS on the Lt LE to equal right.    Time 6    Period Weeks    Status On-going    Target Date 10/06/21                    Plan - 09/19/21 1022     Clinical Impression Statement Pt tolerated the progression of exercises well. Progressed HEP with green TB for bridges and clams. We were able to do leg press and squats w/o increased knee pain. I spoke with her about adding STS to HEP for functional strengthening. Cues to keep in pain free ROM with leg press. Session was limited due to patient's late arrival.    Personal Factors and Comorbidities Comorbidity 2    Comorbidities DM, Afib    PT Frequency 2x / week    PT Duration 6 weeks    PT Treatment/Interventions ADLs/Self Care Home Management;Cryotherapy;Electrical Stimulation;Iontophoresis 4mg /ml Dexamethasone;Moist Heat;Neuromuscular re-education;Balance training;Therapeutic exercise;Therapeutic activities;Functional mobility training;Stair training;Patient/family education;Manual techniques;Dry needling;Taping;Vasopneumatic Device    PT Next Visit Plan Work functional strength (squats, stairs, lunges, SLS activites), floor to stand transfers, modalities and manual therapy PRN.  consider ionto patch or dry needling.    PT Home Exercise Plan [email or web address]    Consulted and Agree with Plan of Care Patient             Patient will benefit from skilled therapeutic intervention in order to improve the following deficits and impairments:  Pain, Impaired flexibility, Decreased strength, Increased edema, Decreased balance, Decreased activity tolerance  Visit Diagnosis: Chronic pain of left knee  Muscle weakness (generalized)  Localized edema     Problem List Patient Active Problem List   Diagnosis Date Noted   Primary osteoarthritis of left knee 08/29/2021   Hip mass, right 08/29/2021   Foot sprain, left, initial encounter 12/13/2020   Chronic anticoagulation 05/07/2020   Non-insulin dependent type 2 diabetes mellitus (HCC) 05/07/2020   Orthostatic hypotension 05/07/2020   Hematoma 05/07/2020   Degenerative arthritis of right elbow  12/15/2019   Paroxysmal atrial fibrillation (HCC) 05/30/2018   Positive colorectal cancer screening using DNA-based stool test 12/19/2016   Low bone mass 12/04/2016   Atrial fibrillation (HCC) 04/25/2016   Atrial flutter (HCC) 04/25/2016   Hyperlipidemia 04/25/2016   History of diabetes mellitus 05/20/2015  Mixed hyperlipidemia 05/20/2015   Essential hypertension 02/17/2015   Palpitations 10/17/2013   Heart murmur 10/15/2013   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/15/2013    Artist Pais, PTA 09/19/2021, 10:23 AM  [facility] [address]  [place], [place], [address] Phone: [phone]   Fax:  [phone]  Name: Abuk Delval Peloso MRN: [id number] Date of Birth: [birth year]

## 2021-09-22 ENCOUNTER — Ambulatory Visit: Payer: Medicare Other

## 2021-09-26 ENCOUNTER — Ambulatory Visit: Payer: Medicare Other | Admitting: Physical Therapy

## 2021-09-26 ENCOUNTER — Other Ambulatory Visit: Payer: Self-pay

## 2021-09-26 ENCOUNTER — Encounter: Payer: Self-pay | Admitting: Physical Therapy

## 2021-09-26 DIAGNOSIS — M25562 Pain in left knee: Secondary | ICD-10-CM | POA: Diagnosis not present

## 2021-09-26 DIAGNOSIS — M6281 Muscle weakness (generalized): Secondary | ICD-10-CM

## 2021-09-26 DIAGNOSIS — R600 Localized edema: Secondary | ICD-10-CM

## 2021-09-26 NOTE — Therapy (Signed)
[facility] [address]  [place], [place], [address] Phone: [phone]   Fax:  [phone]  Physical Therapy Treatment  Patient Details  Name: Ann Hobbs MRN: [id number] Date of Birth: Mar 07, 1946 Referring Provider (PT): Erskine Emery [facility]   Encounter Date: 09/26/2021   PT End of Session - 09/26/21 1105     Visit Number 6    Date for PT Re-Evaluation 10/06/21    Authorization Type MCR    Progress Note Due on Visit 10    PT Start Time 1101    PT Stop Time 1146    PT Time Calculation (min) 45 min    Activity Tolerance Patient tolerated treatment well    Behavior During Therapy [facility] for tasks assessed/performed             Past Medical History:  Diagnosis Date   A-fib [facility])    Brain lesion    Diabetes mellitus without complication ([place])    Dizziness    Elevated glucose    Essential hypertension 04/25/2016   Fall    Hematoma    Hyperlipidemia 04/25/2016   Hypertension    Paroxysmal atrial fibrillation ([place]) 04/25/2016   PONV (postoperative nausea and vomiting)    Typical atrial flutter ([place]) 04/25/2016   Weakness     Past Surgical History:  Procedure Laterality Date   APPENDECTOMY     ATRIAL FIBRILLATION ABLATION N/A 05/30/2018   Procedure: ATRIAL FIBRILLATION ABLATION;  Surgeon: Thompson Grayer, MD;  Location: [place] CV LAB;  Service: Cardiovascular;  Laterality: N/A;   CARDIOVERSION N/A 02/05/2018   Procedure: CARDIOVERSION;  Surgeon: Josue Hector, MD;  Location: [facility] ENDOSCOPY;  Service: Cardiovascular;  Laterality: N/A;   ELBOW FRACTURE SURGERY     KNEE ARTHROSCOPY     PARTIAL HYSTERECTOMY      There were no vitals filed for this visit.   Subjective Assessment - 09/26/21 1101     Subjective Was a little sore yesterday on the inner part of her knee from doing exercises. Pain gets up to 4/10.    Pertinent History T2DM controlled by diet    Diagnostic tests MRI Lt knee: Small  medial meniscus tear medial body; small tear posterior horn lateral meniscus    Patient Stated Goals to decrease pain and swelling    Currently in Pain? No/denies                [facility] PT Assessment - 09/26/21 0001       Single Leg Stance   Comments Rt 20 sec; Lt 15 sec                           [facility] Adult PT Treatment/Exercise - 09/26/21 0001       Self-Care   Self-Care Other Self-Care Comments    Other Self-Care Comments  discussed use of rolling pin 1x/day to lateral quads, ITB and lateral HS; discussed reasoning behind stretching vs. strengthening      Knee/Hip Exercises: Stretches   ITB Stretch Left;1 rep;10 seconds    ITB Stretch Limitations for review of HEP      Knee/Hip Exercises: Aerobic   Recumbent Bike L2x38min      Knee/Hip Exercises: Machines for Strengthening   Cybex Knee Extension 15# x 10    Cybex Knee Flexion 15# x10;  20# 2 x 10    Cybex Leg Press 15lb 20 reps  Knee/Hip Exercises: Standing   SLS mutiple reps bil; 20 sec R; 15 sec L max hold      Knee/Hip Exercises: Seated   Long Arc Quad Strengthening;Left;10 reps    Long Arc Quad Limitations Blue TB  for HEP      Knee/Hip Exercises: Prone   Hamstring Curl 5 reps    Hamstring Curl Limitations blue TB for HEP demo      Manual Therapy   Manual Therapy Myofascial release;Soft tissue mobilization    Manual therapy comments in hooklying due to right hematoma    Soft tissue mobilization to left lateral quad    Myofascial Release strumming to left ITB with TPR distally                     PT Education - 09/26/21 2029     Education Details HEP progressed    Person(s) Educated Patient    Methods Explanation;Demonstration;Handout;Verbal cues    Comprehension Verbalized understanding;Returned demonstration                 PT Long Term Goals - 09/26/21 1108       PT LONG TERM GOAL #1   Title Patient to be independent with advanced HEP.    Status  On-going      PT LONG TERM GOAL #3   Title Pt able to enter/exit tub without difficulty and minimal left knee pain.    Status Achieved      PT LONG TERM GOAL #4   Title Patient able to perform floor to stand transfer without UE assistance.    Baseline uses UE for balance    Status Partially Met      PT LONG TERM GOAL #5   Title Patient to demonstrate improved SLS on the Lt LE to equal right.    Status On-going                   Plan - 09/26/21 2033     Clinical Impression Statement Patient is progressing well toward her LTGs. She is able to get in/out of the tub without difficulty and floor to stand transfer is somewhat easier, however she still uses UE assist. She tolerated increased resistance with knee exercises today and HEP was progressed. She still reports pain in the distal lateral quads and HS and has TPs along the left lateral quad and her ITB. She would benefit from DN and more manual therapy to address this next visit. Her LTGs are ongoing.    PT Frequency 2x / week    PT Duration 6 weeks    PT Treatment/Interventions ADLs/Self Care Home Management;Cryotherapy;Electrical Stimulation;Iontophoresis 4mg /ml Dexamethasone;Moist Heat;Neuromuscular re-education;Balance training;Therapeutic exercise;Therapeutic activities;Functional mobility training;Stair training;Patient/family education;Manual techniques;Dry needling;Taping;Vasopneumatic Device    PT Next Visit Plan DN to lateral left quads/HS. Work functional strength (squats, stairs, lunges, SLS activites), floor to stand transfers, modalities and manual therapy PRN.    PT Home Exercise Plan [email or web address]    Consulted and Agree with Plan of Care Patient             Patient will benefit from skilled therapeutic intervention in order to improve the following deficits and impairments:  Pain, Impaired flexibility, Decreased strength, Increased edema, Decreased balance, Decreased activity tolerance  Visit Diagnosis: Muscle  weakness (generalized)  Chronic pain of left knee  Localized edema     Problem List Patient Active Problem List   Diagnosis Date Noted   Primary osteoarthritis of left knee 08/29/2021  Hip mass, right 08/29/2021   Foot sprain, left, initial encounter 12/13/2020   Chronic anticoagulation 05/07/2020   Non-insulin dependent type 2 diabetes mellitus ([place]) 05/07/2020   Orthostatic hypotension 05/07/2020   Hematoma 05/07/2020   Degenerative arthritis of right elbow 12/15/2019   Paroxysmal atrial fibrillation ([place]) 05/30/2018   Positive colorectal cancer screening using DNA-based stool test 12/19/2016   Low bone mass 12/04/2016   Atrial fibrillation ([place]) 04/25/2016   Atrial flutter ([place]) 04/25/2016   Hyperlipidemia 04/25/2016   History of diabetes mellitus 05/20/2015   Mixed hyperlipidemia 05/20/2015   Essential hypertension 02/17/2015   Palpitations 10/17/2013   Heart murmur 10/15/2013   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/15/2013   Madelyn Flavors, PT 09/26/2021, 8:38 PM  [facility] [address]  [place], [place], [address] Phone: [phone]   Fax:  [phone]  Name: Damica Gravlin Mariscal MRN: [id number] Date of Birth: 1946-07-03

## 2021-09-26 NOTE — Patient Instructions (Signed)
Access Code: [email or web address] URL: [email or web address] Date: 09/26/2021 Prepared by: Raynelle Fanning  Exercises Supine ITB Stretch with Strap - 2 x daily - 7 x weekly - 1 sets - 3 reps - 30 sec hold Prone Quadriceps Stretch with Strap - 2-3 x daily - 7 x weekly - 1 sets - 3 reps - 30 sec hold Supine Active Straight Leg Raise - 1 x daily - 7 x weekly - 2 sets - 10 reps Supine Bridge with Resistance Band - 1 x daily - 7 x weekly - 2 sets - 10 reps Hooklying Isometric Clamshell - 1 x daily - 7 x weekly - 2 sets - 10 reps - 3 sec hold Seated Hamstring Stretch - 1 x daily - 7 x weekly - 2 sets - 2 reps - 15-30 second hold hold Prone Hamstring Curl with Anchored Resistance (Mirrored) - 1 x daily - 4 x weekly - 2 sets - 10 reps Sitting Knee Extension with Resistance - 1 x daily - 4 x weekly - 2 sets - 10 reps - 10 seconds hold

## 2021-09-29 ENCOUNTER — Ambulatory Visit: Payer: Medicare Other | Admitting: Physical Therapy

## 2021-09-29 ENCOUNTER — Other Ambulatory Visit: Payer: Self-pay

## 2021-09-29 ENCOUNTER — Encounter: Payer: Self-pay | Admitting: Physical Therapy

## 2021-09-29 DIAGNOSIS — M6281 Muscle weakness (generalized): Secondary | ICD-10-CM

## 2021-09-29 DIAGNOSIS — R600 Localized edema: Secondary | ICD-10-CM

## 2021-09-29 DIAGNOSIS — M25562 Pain in left knee: Secondary | ICD-10-CM

## 2021-09-29 NOTE — Therapy (Signed)
[facility] [address]  [place], [place], [address] Phone: [phone]   Fax:  [phone]  Physical Therapy Treatment  Patient Details  Name: Ann Hobbs MRN: [id number] Date of Birth: 1946-01-05 Referring Provider (PT): Erskine Emery [facility]   Encounter Date: 09/29/2021   PT End of Session - 09/29/21 6759     Visit Number 7    Date for PT Re-Evaluation 10/06/21    Authorization Type MCR    Progress Note Due on Visit 10    PT Start Time 0933    PT Stop Time 1015    PT Time Calculation (min) 42 min    Activity Tolerance Patient tolerated treatment well    Behavior During Therapy [facility] for tasks assessed/performed             Past Medical History:  Diagnosis Date   A-fib [facility])    Brain lesion    Diabetes mellitus without complication ([place])    Dizziness    Elevated glucose    Essential hypertension 04/25/2016   Fall    Hematoma    Hyperlipidemia 04/25/2016   Hypertension    Paroxysmal atrial fibrillation ([place]) 04/25/2016   PONV (postoperative nausea and vomiting)    Typical atrial flutter ([place]) 04/25/2016   Weakness     Past Surgical History:  Procedure Laterality Date   APPENDECTOMY     ATRIAL FIBRILLATION ABLATION N/A 05/30/2018   Procedure: ATRIAL FIBRILLATION ABLATION;  Surgeon: Thompson Grayer, MD;  Location: [place] CV LAB;  Service: Cardiovascular;  Laterality: N/A;   CARDIOVERSION N/A 02/05/2018   Procedure: CARDIOVERSION;  Surgeon: Josue Hector, MD;  Location: [facility] ENDOSCOPY;  Service: Cardiovascular;  Laterality: N/A;   ELBOW FRACTURE SURGERY     KNEE ARTHROSCOPY     PARTIAL HYSTERECTOMY      There were no vitals filed for this visit.   Subjective Assessment - 09/29/21 0939     Subjective Patient reports she has been very busy, lots of shopping which has been aggravating her knee.  Weather is also not helping.    Pertinent History T2DM controlled by diet    Diagnostic tests  MRI Lt knee: Small medial meniscus tear medial body; small tear posterior horn lateral meniscus    Patient Stated Goals to decrease pain and swelling    Currently in Pain? Yes    Pain Score 5     Pain Location Knee    Pain Orientation Left                               [facility] Adult PT Treatment/Exercise - 09/29/21 0001       Exercises   Exercises Knee/Hip      Knee/Hip Exercises: Aerobic   Nustep L5 x 6 min      Knee/Hip Exercises: Supine   Bridges Strengthening;2 sets;10 reps    Straight Leg Raises Strengthening;Both;2 sets;10 reps      Knee/Hip Exercises: Sidelying   Clams R side only due to hematoma, x 20      Modalities   Modalities Iontophoresis      Iontophoresis   Type of Iontophoresis Dexamethasone    Location L medial knee    Dose 17m/4ml    Time 4 hour wear time      Manual Therapy   Manual Therapy Soft tissue mobilization;Other (comment)    Manual therapy comments skilled palpation and monitoring  during dry needling    Soft tissue mobilization TPR L lateral quad    Other Manual Therapy dry needling              Trigger Point Dry Needling - 09/29/21 0001     Consent Given? Yes    Education Handout Provided Yes    Muscles Treated Lower Quadrant Rectus femoris;Vastus lateralis   Left   Rectus femoris Response Twitch response elicited;Palpable increased muscle length    Vastus lateralis Response Twitch response elicited;Palpable increased muscle length                        PT Long Term Goals - 09/26/21 1108       PT LONG TERM GOAL #1   Title Patient to be independent with advanced HEP.    Status On-going      PT LONG TERM GOAL #3   Title Pt able to enter/exit tub without difficulty and minimal left knee pain.    Status Achieved      PT LONG TERM GOAL #4   Title Patient able to perform floor to stand transfer without UE assistance.    Baseline uses UE for balance    Status Partially Met      PT LONG TERM  GOAL #5   Title Patient to demonstrate improved SLS on the Lt LE to equal right.    Status On-going                   Plan - 09/29/21 1130     Clinical Impression Statement Pt. reported significant increase in pain due to prolonged standing and shopping for the holidays.  Due to pain, focused on supine exercises, followed by manual therapy to L quad, including dry needling.  She tolerated dry needling well and noted significant decrease in tightness and pain in lateral quads afterwards.  Iontophoresis patch with dexamethasone applied to L medial knee over pes anserine to decrease pain and inflammation.    PT Frequency 2x / week    PT Duration 6 weeks    PT Treatment/Interventions ADLs/Self Care Home Management;Cryotherapy;Electrical Stimulation;Iontophoresis 45m/ml Dexamethasone;Moist Heat;Neuromuscular re-education;Balance training;Therapeutic exercise;Therapeutic activities;Functional mobility training;Stair training;Patient/family education;Manual techniques;Dry needling;Taping;Vasopneumatic Device    PT Next Visit Plan DN to lateral left quads/HS. Work functional strength (squats, stairs, lunges, SLS activites), floor to stand transfers, modalities and manual therapy PRN.    PT Home Exercise Plan [email or web address]    Consulted and Agree with Plan of Care Patient             Patient will benefit from skilled therapeutic intervention in order to improve the following deficits and impairments:  Pain, Impaired flexibility, Decreased strength, Increased edema, Decreased balance, Decreased activity tolerance  Visit Diagnosis: Muscle weakness (generalized)  Chronic pain of left knee  Localized edema     Problem List Patient Active Problem List   Diagnosis Date Noted   Primary osteoarthritis of left knee 08/29/2021   Hip mass, right 08/29/2021   Foot sprain, left, initial encounter 12/13/2020   Chronic anticoagulation 05/07/2020   Non-insulin dependent type 2 diabetes mellitus  ([place] 05/07/2020   Orthostatic hypotension 05/07/2020   Hematoma 05/07/2020   Degenerative arthritis of right elbow 12/15/2019   Paroxysmal atrial fibrillation ([place] 05/30/2018   Positive colorectal cancer screening using DNA-based stool test 12/19/2016   Low bone mass 12/04/2016   Atrial fibrillation ([place] 04/25/2016   Atrial flutter ([place] 04/25/2016   Hyperlipidemia 04/25/2016   History  of diabetes mellitus 05/20/2015   Mixed hyperlipidemia 05/20/2015   Essential hypertension 02/17/2015   Palpitations 10/17/2013   Heart murmur 10/15/2013   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/15/2013    Rennie Natter, PT, DPT  09/29/2021, 11:34 AM  [facility] [address]  [place], [place], [address] Phone: [phone]   Fax:  [phone]  Name: Ann Hobbs MRN: [id number] Date of Birth: 20-Aug-1946

## 2021-10-04 ENCOUNTER — Encounter: Payer: Medicare Other | Admitting: Physical Therapy

## 2021-10-06 ENCOUNTER — Other Ambulatory Visit: Payer: Self-pay

## 2021-10-06 ENCOUNTER — Ambulatory Visit: Payer: Medicare Other | Admitting: Physical Therapy

## 2021-10-06 ENCOUNTER — Encounter: Payer: Self-pay | Admitting: Physical Therapy

## 2021-10-06 DIAGNOSIS — M6281 Muscle weakness (generalized): Secondary | ICD-10-CM

## 2021-10-06 DIAGNOSIS — M25562 Pain in left knee: Secondary | ICD-10-CM

## 2021-10-06 DIAGNOSIS — R600 Localized edema: Secondary | ICD-10-CM

## 2021-10-06 NOTE — Therapy (Signed)
[facility] [address]  [place], [place], [address] Phone: [phone]   Fax:  [phone]  Physical Therapy Treatment Progress Note Reporting Period 08/25/2021 to 10/06/2021  See note below for Objective Data and Assessment of Progress/Goals.     Patient Details  Name: Ann Hobbs [place] MRN: [id number] Date of Birth: 29-May-1946 Referring Provider (PT): Erskine Emery [facility]   Encounter Date: 10/06/2021   PT End of Session - 10/06/21 1110     Visit Number 8    Date for PT Re-Evaluation 11/03/21    Authorization Type MCR    Progress Note Due on Visit 18    PT Start Time 1103    PT Stop Time 4008    PT Time Calculation (min) 53 min    Activity Tolerance Patient tolerated treatment well    Behavior During Therapy [facility] for tasks assessed/performed             Past Medical History:  Diagnosis Date   A-fib [facility])    Brain lesion    Diabetes mellitus without complication ([place])    Dizziness    Elevated glucose    Essential hypertension 04/25/2016   Fall    Hematoma    Hyperlipidemia 04/25/2016   Hypertension    Paroxysmal atrial fibrillation ([place]) 04/25/2016   PONV (postoperative nausea and vomiting)    Typical atrial flutter ([place]) 04/25/2016   Weakness     Past Surgical History:  Procedure Laterality Date   APPENDECTOMY     ATRIAL FIBRILLATION ABLATION N/A 05/30/2018   Procedure: ATRIAL FIBRILLATION ABLATION;  Surgeon: Thompson Grayer, MD;  Location: [place] CV LAB;  Service: Cardiovascular;  Laterality: N/A;   CARDIOVERSION N/A 02/05/2018   Procedure: CARDIOVERSION;  Surgeon: Josue Hector, MD;  Location: [facility] ENDOSCOPY;  Service: Cardiovascular;  Laterality: N/A;   ELBOW FRACTURE SURGERY     KNEE ARTHROSCOPY     PARTIAL HYSTERECTOMY      There were no vitals filed for this visit.   Subjective Assessment - 10/06/21 1107     Subjective Patient reports her knee is bothering her today.   Yesterday was pretty good, not sure why today is worse.   Notices that they dry needling helped in L thigh, doesn't have those knots anymore and doesn't hurt there.  Knee pain in below knee cap today.  But she woke up with her R hip really hurting this morning, this is limiting more than her knee.    Pertinent History T2DM controlled by diet    Diagnostic tests MRI Lt knee: Small medial meniscus tear medial body; small tear posterior horn lateral meniscus    Patient Stated Goals to decrease pain and swelling    Currently in Pain? Yes    Pain Score 6     Pain Location Knee    Pain Orientation Left                [facility] PT Assessment - 10/06/21 0001       Assessment   Medical Diagnosis primary OA left knee    Referring Provider (PT) Erskine Emery [facility]    Onset Date/Surgical Date 06/30/21    Hand Dominance Right                           [facility] Adult PT Treatment/Exercise - 10/06/21 0001       Self-Care   Self-Care Other Self-Care Comments  Other Self-Care Comments  reviewed exercises, progress, concerns, and goals.  Demo getting up from floor using chair to assist rather than having husband pulling her up which increases risk of injury.      Exercises   Exercises Knee/Hip      Knee/Hip Exercises: Aerobic   Nustep L5 x 6 min      Knee/Hip Exercises: Standing   Heel Raises Both;2 sets;10 reps    Hip Abduction Stengthening;Left;2 sets;10 reps    Abduction Limitations UE support    Functional Squat 2 sets;10 reps      Knee/Hip Exercises: Supine   Bridges Strengthening;2 sets;10 reps    Bridges Limitations instructed in abdominal bracing, able to perform without LBP    Straight Leg Raises Strengthening;Left;2 sets;10 reps      Knee/Hip Exercises: Prone   Hamstring Curl 2 sets;10 reps    Hamstring Curl Limitations left, pillow placed under hips to decrease lumbar discomfort    Hip Extension Strengthening;Left;5 reps    Hip Extension Limitations very  difficult, able to perform with bent knee but not straight      Modalities   Modalities Iontophoresis      Iontophoresis   Type of Iontophoresis Dexamethasone    Location L medial knee    Dose $Rem'4mg'qFne$ /58ml    Time 4 hour wear time      Manual Therapy   Manual Therapy Soft tissue mobilization    Soft tissue mobilization IASTM with foam roller to bil gluts and lumbar paraspinals to decrease pain after exercise.                          PT Long Term Goals - 10/06/21 1155       PT LONG TERM GOAL #1   Title Patient to be independent with advanced HEP.    Status On-going   10/06/21- met for current   Target Date 11/03/21      PT LONG TERM GOAL #2   Title Improved bil hip and knee strength to 5/5 and improved form with functional squats.    Time 6    Period Weeks    Status On-going   10/06/21- able to perform mini-squats but increased pain.   Target Date 11/03/21      PT LONG TERM GOAL #3   Title Pt able to enter/exit tub without difficulty and minimal left knee pain.    Time 4    Status On-going    Target Date 11/03/21      PT LONG TERM GOAL #4   Title Patient able to perform floor to stand transfer without UE assistance.    Baseline uses UE for balance    Time 4    Status On-going   10/06/21- demonstrated floor to stand transfer using chair to assist using R LE and LUE to assist movement.   Target Date 11/03/21      PT LONG TERM GOAL #5   Title Patient to demonstrate improved SLS on the Lt LE to equal right.    Status On-going   10/06/21- not tested today due to L knee pain with stance activities.   Target Date 11/03/21                   Plan - 10/06/21 1727     Clinical Impression Statement Patient reports continued L knee pain especially with stairs.  However her progress and treatment has been complicated by R hip pain/hematoma and R  elbow pain limiting full participation.  Today reported that hip/knee strengthening exercises have also been  exacerbating LBP.  With modifications (abdominal bracing with bridges, pillow under hips with prone exercises) reported no LBP with these exercises.  She would benefit from continued skilled therapy (2x/week for additional 4 weeks) in order to improve functional moblity and pain.    PT Frequency 2x / week    PT Duration 6 weeks    PT Treatment/Interventions ADLs/Self Care Home Management;Cryotherapy;Electrical Stimulation;Iontophoresis 4mg /ml Dexamethasone;Moist Heat;Neuromuscular re-education;Balance training;Therapeutic exercise;Therapeutic activities;Functional mobility training;Stair training;Patient/family education;Manual techniques;Dry needling;Taping;Vasopneumatic Device    PT Next Visit Plan DN to lateral left quads/HS. Work functional strength (squats, stairs, lunges, SLS activites), floor to stand transfers, modalities and manual therapy PRN.    PT Home Exercise Plan [id number]    Consulted and Agree with Plan of Care Patient             Patient will benefit from skilled therapeutic intervention in order to improve the following deficits and impairments:  Pain, Impaired flexibility, Decreased strength, Increased edema, Decreased balance, Decreased activity tolerance  Visit Diagnosis: Muscle weakness (generalized)  Chronic pain of left knee  Localized edema     Problem List Patient Active Problem List   Diagnosis Date Noted   Primary osteoarthritis of left knee 08/29/2021   Hip mass, right 08/29/2021   Foot sprain, left, initial encounter 12/13/2020   Chronic anticoagulation 05/07/2020   Non-insulin dependent type 2 diabetes mellitus ([place]) 05/07/2020   Orthostatic hypotension 05/07/2020   Hematoma 05/07/2020   Degenerative arthritis of right elbow 12/15/2019   Paroxysmal atrial fibrillation ([place]) 05/30/2018   Positive colorectal cancer screening using DNA-based stool test 12/19/2016   Low bone mass 12/04/2016   Atrial fibrillation ([place]) 04/25/2016   Atrial flutter  ([place]) 04/25/2016   Hyperlipidemia 04/25/2016   History of diabetes mellitus 05/20/2015   Mixed hyperlipidemia 05/20/2015   Essential hypertension 02/17/2015   Palpitations 10/17/2013   Heart murmur 10/15/2013   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/15/2013    Rennie Natter, PT, DPT  10/06/2021, 5:43 PM  [facility] [address]  [place], [place], [address] Phone: [phone]   Fax:  [phone]  Name: Shaniya Tashiro Martinek MRN: [id number] Date of Birth: [birth year]

## 2021-10-11 ENCOUNTER — Encounter: Payer: Self-pay | Admitting: Physical Therapy

## 2021-10-11 ENCOUNTER — Other Ambulatory Visit: Payer: Self-pay

## 2021-10-11 ENCOUNTER — Ambulatory Visit: Payer: Medicare Other | Attending: Physician Assistant | Admitting: Physical Therapy

## 2021-10-11 DIAGNOSIS — M25562 Pain in left knee: Secondary | ICD-10-CM | POA: Diagnosis present

## 2021-10-11 DIAGNOSIS — M6281 Muscle weakness (generalized): Secondary | ICD-10-CM | POA: Diagnosis present

## 2021-10-11 DIAGNOSIS — R600 Localized edema: Secondary | ICD-10-CM | POA: Insufficient documentation

## 2021-10-11 DIAGNOSIS — G8929 Other chronic pain: Secondary | ICD-10-CM | POA: Diagnosis present

## 2021-10-11 NOTE — Therapy (Signed)
[facility] [address]  [place], [place], [address] Phone: [phone]   Fax:  [phone]  Physical Therapy Treatment  Patient Details  Name: Ann Hobbs MRN: [id number] Date of Birth: 10-03-1946 Referring Provider (PT): Erskine Emer[facility]   Encounter Date: 10/11/2021   PT End of Session - 10/11/21 1543     Visit Number 9    Date for PT Re-Evaluation 11/03/21    Authorization Type MCR    Progress Note Due on Visit 18    PT Start Time 1536    PT Stop Time 6811    PT Time Calculation (min) 39 min    Activity Tolerance Patient tolerated treatment well    Behavior During Therapy [facility] for tasks assessed/performed             Past Medical History:  Diagnosis Date   A-fib [facility])    Brain lesion    Diabetes mellitus without complication ([place])    Dizziness    Elevated glucose    Essential hypertension 04/25/2016   Fall    Hematoma    Hyperlipidemia 04/25/2016   Hypertension    Paroxysmal atrial fibrillation ([place]) 04/25/2016   PONV (postoperative nausea and vomiting)    Typical atrial flutter ([place]) 04/25/2016   Weakness     Past Surgical History:  Procedure Laterality Date   APPENDECTOMY     ATRIAL FIBRILLATION ABLATION N/A 05/30/2018   Procedure: ATRIAL FIBRILLATION ABLATION;  Surgeon: Thompson Grayer, MD;  Location: [place] CV LAB;  Service: Cardiovascular;  Laterality: N/A;   CARDIOVERSION N/A 02/05/2018   Procedure: CARDIOVERSION;  Surgeon: Josue Hector, MD;  Location: [facility] ENDOSCOPY;  Service: Cardiovascular;  Laterality: N/A;   ELBOW FRACTURE SURGERY     KNEE ARTHROSCOPY     PARTIAL HYSTERECTOMY      There were no vitals filed for this visit.   Subjective Assessment - 10/11/21 1539     Subjective Patient reports still having L medial knee pain, doesn't think the patches have helped at all.  Feels a little light headed today, dizzy going up and down ladder.  Checked her BP and it was  150/127.  PCP aware.   Also has more swelling in L ankle today.    Pertinent History T2DM controlled by diet    Diagnostic tests MRI Lt knee: Small medial meniscus tear medial body; small tear posterior horn lateral meniscus    Patient Stated Goals to decrease pain and swelling                               [facility] Adult PT Treatment/Exercise - 10/11/21 0001       Exercises   Exercises Knee/Hip      Knee/Hip Exercises: Aerobic   Nustep L5 x 6 min      Knee/Hip Exercises: Standing   Heel Raises Both;2 sets;10 reps    Functional Squat 2 sets;10 reps      Knee/Hip Exercises: Supine   Short Arc Quad Sets Strengthening;Left;2 sets;10 reps    Straight Leg Raises Strengthening;Left;2 sets;10 reps      Modalities   Modalities Ultrasound      Ultrasound   Ultrasound Location L medial knee    Ultrasound Parameters 1 MHz, 1.2 w/cm2 continuous (head warmer off)    Ultrasound Goals Pain  PT Long Term Goals - 10/06/21 1155       PT LONG TERM GOAL #1   Title Patient to be independent with advanced HEP.    Status On-going   10/06/21- met for current   Target Date 11/03/21      PT LONG TERM GOAL #2   Title Improved bil hip and knee strength to 5/5 and improved form with functional squats.    Time 6    Period Weeks    Status On-going   10/06/21- able to perform mini-squats but increased pain.   Target Date 11/03/21      PT LONG TERM GOAL #3   Title Pt able to enter/exit tub without difficulty and minimal left knee pain.    Time 4    Status On-going    Target Date 11/03/21      PT LONG TERM GOAL #4   Title Patient able to perform floor to stand transfer without UE assistance.    Baseline uses UE for balance    Time 4    Status On-going   10/06/21- demonstrated floor to stand transfer using chair to assist using R LE and LUE to assist movement.   Target Date 11/03/21      PT LONG TERM GOAL #5   Title Patient to  demonstrate improved SLS on the Lt LE to equal right.    Status On-going   10/06/21- not tested today due to L knee pain with stance activities.   Target Date 11/03/21                   Plan - 10/11/21 1730     Clinical Impression Statement Patient continues to report feeling that many of L knee pain exercises are exacerbating her R hip, and that her R hip pain is aggravating her L knee.  Today discussed at length during [place] and exercises how the R hip pain was impairing her progress and recommended contacting her orthopedist about her R hip.  She was extremely tender over L medial knee, so trialed [place].  She responded very well , reporting significant decrease in L knee pain and tightness, so will continue with this modality.  She demonstrated improved tolerance to L knee strengthening following [place], although focused on supine exercises to avoid exacerbating R hip.    PT Frequency 2x / week    PT Duration 6 weeks    PT Treatment/Interventions ADLs/Self Care Home Management;Cryotherapy;Electrical Stimulation;Iontophoresis 78m/ml Dexamethasone;Moist Heat;Neuromuscular re-education;Balance training;Therapeutic exercise;Therapeutic activities;Functional mobility training;Stair training;Patient/family education;Manual techniques;Dry needling;Taping;Vasopneumatic Device    PT Next Visit Plan Work functional strength (squats, stairs, lunges, SLS activites), floor to stand transfers, modalities and manual therapy PRN - [place] medial knee to decrease pain.    PT Home Exercise Plan [email or web address]    Consulted and Agree with Plan of Care Patient             Patient will benefit from skilled therapeutic intervention in order to improve the following deficits and impairments:  Pain, Impaired flexibility, Decreased strength, Increased edema, Decreased balance, Decreased activity tolerance  Visit Diagnosis: Muscle weakness (generalized)  Chronic pain of left knee  Localized edema     Problem  List Patient Active Problem List   Diagnosis Date Noted   Primary osteoarthritis of left knee 08/29/2021   Hip mass, right 08/29/2021   Foot sprain, left, initial encounter 12/13/2020   Chronic anticoagulation 05/07/2020   Non-insulin dependent type 2 diabetes mellitus ([place] 05/07/2020   Orthostatic hypotension  05/07/2020   Hematoma 05/07/2020   Degenerative arthritis of right elbow 12/15/2019   Paroxysmal atrial fibrillation ([place]) 05/30/2018   Positive colorectal cancer screening using DNA-based stool test 12/19/2016   Low bone mass 12/04/2016   Atrial fibrillation ([place]) 04/25/2016   Atrial flutter ([place]) 04/25/2016   Hyperlipidemia 04/25/2016   History of diabetes mellitus 05/20/2015   Mixed hyperlipidemia 05/20/2015   Essential hypertension 02/17/2015   Palpitations 10/17/2013   Heart murmur 10/15/2013   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/15/2013    Rennie Natter, PT, DPT  10/11/2021, 5:39 PM  [facility] [address]  [place], [place], [address] Phone: [phone]   Fax:  [phone]  Name: Ann Hobbs MRN: [id number] Date of Birth: 10/28/1945

## 2021-10-13 ENCOUNTER — Ambulatory Visit: Payer: Medicare Other

## 2021-10-13 ENCOUNTER — Other Ambulatory Visit: Payer: Self-pay

## 2021-10-13 DIAGNOSIS — G8929 Other chronic pain: Secondary | ICD-10-CM

## 2021-10-13 DIAGNOSIS — M6281 Muscle weakness (generalized): Secondary | ICD-10-CM | POA: Diagnosis not present

## 2021-10-13 DIAGNOSIS — R600 Localized edema: Secondary | ICD-10-CM

## 2021-10-13 NOTE — Therapy (Signed)
[facility] [address]  [place], [place], [address] Phone: [phone]   Fax:  [phone]  Physical Therapy Treatment  Patient Details  Name: Ann Hobbs MRN: [id number] Date of Birth: Aug 13, 1946 Referring Provider (PT): Erskine Emery [facility]   Encounter Date: 10/13/2021   PT End of Session - 10/13/21 1754     Visit Number 10    Date for PT Re-Evaluation 11/03/21    Authorization Type MCR    Progress Note Due on Visit 18    PT Start Time 4128    PT Stop Time 1745    PT Time Calculation (min) 40 min    Activity Tolerance Patient tolerated treatment well    Behavior During Therapy [facility] for tasks assessed/performed             Past Medical History:  Diagnosis Date   A-fib [facility])    Brain lesion    Diabetes mellitus without complication ([place])    Dizziness    Elevated glucose    Essential hypertension 04/25/2016   Fall    Hematoma    Hyperlipidemia 04/25/2016   Hypertension    Paroxysmal atrial fibrillation ([place]) 04/25/2016   PONV (postoperative nausea and vomiting)    Typical atrial flutter ([place]) 04/25/2016   Weakness     Past Surgical History:  Procedure Laterality Date   APPENDECTOMY     ATRIAL FIBRILLATION ABLATION N/A 05/30/2018   Procedure: ATRIAL FIBRILLATION ABLATION;  Surgeon: Thompson Grayer, MD;  Location: [place] CV LAB;  Service: Cardiovascular;  Laterality: N/A;   CARDIOVERSION N/A 02/05/2018   Procedure: CARDIOVERSION;  Surgeon: Josue Hector, MD;  Location: [facility] ENDOSCOPY;  Service: Cardiovascular;  Laterality: N/A;   ELBOW FRACTURE SURGERY     KNEE ARTHROSCOPY     PARTIAL HYSTERECTOMY      There were no vitals filed for this visit.   Subjective Assessment - 10/13/21 1708     Subjective Pt having increased pain and swelling today. Did a lot packing up decorations and climbing stairs.    Pertinent History T2DM controlled by diet    Diagnostic tests MRI Lt knee: Small medial  meniscus tear medial body; small tear posterior horn lateral meniscus    Patient Stated Goals to decrease pain and swelling    Currently in Pain? Yes    Pain Score 6     Pain Location Knee    Pain Orientation Left    Pain Descriptors / Indicators Dull    Pain Type Chronic pain                               [facility] Adult PT Treatment/Exercise - 10/13/21 0001       Knee/Hip Exercises: Stretches   Passive Hamstring Stretch Left;3 reps;10 seconds    Passive Hamstring Stretch Limitations passive      Knee/Hip Exercises: Aerobic   Nustep L5 x 6 min   LE only     Knee/Hip Exercises: Standing   Heel Raises Both;2 sets;10 reps      Knee/Hip Exercises: Seated   Long Arc Quad Strengthening;Left;10 reps    Long Arc Quad Limitations alt PF/DF to help with swelling      Knee/Hip Exercises: Supine   Straight Leg Raises Strengthening;Left;10 reps      Modalities   Modalities Ultrasound      Ultrasound   Ultrasound Location L medi  Ultrasound Parameters 1 MHz, 1.2 w/cm2 continuous (head warmer off)    Ultrasound Goals Pain      Manual Therapy   Manual Therapy Joint mobilization;Passive ROM    Joint Mobilization TF joint mobs for pain    Passive ROM passive HS stretch and knee flexion                          PT Long Term Goals - 10/06/21 1155       PT LONG TERM GOAL #1   Title Patient to be independent with advanced HEP.    Status On-going   10/06/21- met for current   Target Date 11/03/21      PT LONG TERM GOAL #2   Title Improved bil hip and knee strength to 5/5 and improved form with functional squats.    Time 6    Period Weeks    Status On-going   10/06/21- able to perform mini-squats but increased pain.   Target Date 11/03/21      PT LONG TERM GOAL #3   Title Pt able to enter/exit tub without difficulty and minimal left knee pain.    Time 4    Status On-going    Target Date 11/03/21      PT LONG TERM GOAL #4   Title Patient  able to perform floor to stand transfer without UE assistance.    Baseline uses UE for balance    Time 4    Status On-going   10/06/21- demonstrated floor to stand transfer using chair to assist using R LE and LUE to assist movement.   Target Date 11/03/21      PT LONG TERM GOAL #5   Title Patient to demonstrate improved SLS on the Lt LE to equal right.    Status On-going   10/06/21- not tested today due to L knee pain with stance activities.   Target Date 11/03/21                   Plan - 10/13/21 1756     Clinical Impression Statement Pt reported not much improvement in her L knee pain lately. Notes more swelling and pain but also that she has been doing a lot of ADL that require squating and stair climbing. After the interventions today she noted a decrease in pain. Provided education and instruction on pacing activities at home to reduce overdoing things, and taking rest to offload her knees.    Personal Factors and Comorbidities Comorbidity 2    Comorbidities DM, Afib    PT Frequency 2x / week    PT Duration 6 weeks    PT Treatment/Interventions ADLs/Self Care Home Management;Cryotherapy;Electrical Stimulation;Iontophoresis 4mg /ml Dexamethasone;Moist Heat;Neuromuscular re-education;Balance training;Therapeutic exercise;Therapeutic activities;Functional mobility training;Stair training;Patient/family education;Manual techniques;Dry needling;Taping;Vasopneumatic Device    PT Next Visit Plan Work functional strength (squats, stairs, lunges, SLS activites), floor to stand transfers, modalities and manual therapy PRN - [place] to medial knee to decrease pain.    PT Home Exercise Plan [email or web address]    Consulted and Agree with Plan of Care Patient             Patient will benefit from skilled therapeutic intervention in order to improve the following deficits and impairments:  Pain, Impaired flexibility, Decreased strength, Increased edema, Decreased balance, Decreased activity  tolerance  Visit Diagnosis: Muscle weakness (generalized)  Chronic pain of left knee  Localized edema     Problem List Patient Active Problem List  Diagnosis Date Noted   Primary osteoarthritis of left knee 08/29/2021   Hip mass, right 08/29/2021   Foot sprain, left, initial encounter 12/13/2020   Chronic anticoagulation 05/07/2020   Non-insulin dependent type 2 diabetes mellitus ([place]) 05/07/2020   Orthostatic hypotension 05/07/2020   Hematoma 05/07/2020   Degenerative arthritis of right elbow 12/15/2019   Paroxysmal atrial fibrillation ([place]) 05/30/2018   Positive colorectal cancer screening using DNA-based stool test 12/19/2016   Low bone mass 12/04/2016   Atrial fibrillation ([place]) 04/25/2016   Atrial flutter ([place]) 04/25/2016   Hyperlipidemia 04/25/2016   History of diabetes mellitus 05/20/2015   Mixed hyperlipidemia 05/20/2015   Essential hypertension 02/17/2015   Palpitations 10/17/2013   Heart murmur 10/15/2013   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/15/2013    Artist Pais, PTA 10/13/2021, 5:59 PM  [facility] [address]  [place], [place], [address] Phone: [phone]   Fax:  [phone]  Name: Ann Hobbs MRN: [id number] Date of Birth: July 29, 1946

## 2021-10-18 ENCOUNTER — Encounter: Payer: Self-pay | Admitting: Physical Therapy

## 2021-10-18 ENCOUNTER — Other Ambulatory Visit: Payer: Self-pay

## 2021-10-18 ENCOUNTER — Ambulatory Visit: Payer: Medicare Other | Admitting: Physical Therapy

## 2021-10-18 DIAGNOSIS — M6281 Muscle weakness (generalized): Secondary | ICD-10-CM

## 2021-10-18 DIAGNOSIS — G8929 Other chronic pain: Secondary | ICD-10-CM

## 2021-10-18 DIAGNOSIS — M25562 Pain in left knee: Secondary | ICD-10-CM

## 2021-10-18 DIAGNOSIS — R600 Localized edema: Secondary | ICD-10-CM

## 2021-10-18 NOTE — Therapy (Signed)
[facility] [address]  [place], [place], [address] Phone: [phone]   Fax:  [phone]  Physical Therapy Treatment  Patient Details  Name: Ann Hobbs MRN: [id number] Date of Birth: 1946/09/13 Referring Provider (PT): Erskine Emery [facility]   Encounter Date: 10/18/2021   PT End of Session - 10/18/21 1454     Visit Number 11    Date for PT Re-Evaluation 11/03/21    Authorization Type MCR    Progress Note Due on Visit 18    PT Start Time 1450    PT Stop Time 1528    PT Time Calculation (min) 38 min    Activity Tolerance Patient tolerated treatment well    Behavior During Therapy [facility] for tasks assessed/performed             Past Medical History:  Diagnosis Date   A-fib [facility])    Brain lesion    Diabetes mellitus without complication ([place])    Dizziness    Elevated glucose    Essential hypertension 04/25/2016   Fall    Hematoma    Hyperlipidemia 04/25/2016   Hypertension    Paroxysmal atrial fibrillation ([place]) 04/25/2016   PONV (postoperative nausea and vomiting)    Typical atrial flutter ([place]) 04/25/2016   Weakness     Past Surgical History:  Procedure Laterality Date   APPENDECTOMY     ATRIAL FIBRILLATION ABLATION N/A 05/30/2018   Procedure: ATRIAL FIBRILLATION ABLATION;  Surgeon: Thompson Grayer, MD;  Location: [place] CV LAB;  Service: Cardiovascular;  Laterality: N/A;   CARDIOVERSION N/A 02/05/2018   Procedure: CARDIOVERSION;  Surgeon: Josue Hector, MD;  Location: [facility] ENDOSCOPY;  Service: Cardiovascular;  Laterality: N/A;   ELBOW FRACTURE SURGERY     KNEE ARTHROSCOPY     PARTIAL HYSTERECTOMY      There were no vitals filed for this visit.   Subjective Assessment - 10/18/21 1453     Subjective Patient has hematoma removal scheduled on 10/27/2021 with Dr. Ninfa Linden.    Pertinent History T2DM controlled by diet    Diagnostic tests MRI Lt knee: Small medial meniscus tear medial body;  small tear posterior horn lateral meniscus    Patient Stated Goals to decrease pain and swelling    Currently in Pain? Yes    Pain Score 4     Pain Location Knee    Pain Orientation Left                [facility] PT Assessment - 10/18/21 0001       Observation/Other Assessments   Focus on Therapeutic Outcomes (FOTO)  61                           [facility] Adult PT Treatment/Exercise - 10/18/21 0001       Knee/Hip Exercises: Aerobic   Nustep L5 x 6 min      Knee/Hip Exercises: Supine   Straight Leg Raises Strengthening;Left;2 sets;10 reps    Other Supine Knee/Hip Exercises L hip abduction/adduction 2 x 10 focusing on eccentric adductor control      Modalities   Modalities Ultrasound      Ultrasound   Ultrasound Location L med knee    Ultrasound Parameters 3.3 MHz, 1.2 w/cm2 x 8 min to pes anserine and distal patellar ligament    Ultrasound Goals Pain      Manual Therapy   Manual Therapy Soft tissue mobilization  Manual therapy comments to decrease L knee pain and tightness    Joint Mobilization --    Soft tissue mobilization STM to L adductors, effluerage to lower leg to decrease edema,                          PT Long Term Goals - 10/06/21 1155       PT LONG TERM GOAL #1   Title Patient to be independent with advanced HEP.    Status On-going   10/06/21- met for current   Target Date 11/03/21      PT LONG TERM GOAL #2   Title Improved bil hip and knee strength to 5/5 and improved form with functional squats.    Time 6    Period Weeks    Status On-going   10/06/21- able to perform mini-squats but increased pain.   Target Date 11/03/21      PT LONG TERM GOAL #3   Title Pt able to enter/exit tub without difficulty and minimal left knee pain.    Time 4    Status On-going    Target Date 11/03/21      PT LONG TERM GOAL #4   Title Patient able to perform floor to stand transfer without UE assistance.    Baseline uses UE for  balance    Time 4    Status On-going   10/06/21- demonstrated floor to stand transfer using chair to assist using R LE and LUE to assist movement.   Target Date 11/03/21      PT LONG TERM GOAL #5   Title Patient to demonstrate improved SLS on the Lt LE to equal right.    Status On-going   10/06/21- not tested today due to L knee pain with stance activities.   Target Date 11/03/21                   Plan - 10/18/21 1454     Clinical Impression Statement Pt. reports more pain today in L knee again.  She does now have surgery scheduled to have hematoma on R hip removed next week, so discussed discharge next visit.  She is to discuss with orthopedist return to therapy when cleared after surgery.  She responded well today to manual therapy and [place], reporting significant decrease in L knee pain "it feels good!" after interventions.    Personal Factors and Comorbidities Comorbidity 2    Comorbidities DM, Afib    PT Frequency 2x / week    PT Duration 6 weeks    PT Treatment/Interventions ADLs/Self Care Home Management;Cryotherapy;Electrical Stimulation;Iontophoresis 4mg /ml Dexamethasone;Moist Heat;Neuromuscular re-education;Balance training;Therapeutic exercise;Therapeutic activities;Functional mobility training;Stair training;Patient/family education;Manual techniques;Dry needling;Taping;Vasopneumatic Device    PT Next Visit Plan Work functional strength (squats, stairs, lunges, SLS activites), floor to stand transfers, modalities and manual therapy PRN - [place] to medial knee to decrease pain.    PT Home Exercise Plan [id number]    Consulted and Agree with Plan of Care Patient             Patient will benefit from skilled therapeutic intervention in order to improve the following deficits and impairments:  Pain, Impaired flexibility, Decreased strength, Increased edema, Decreased balance, Decreased activity tolerance  Visit Diagnosis: Muscle weakness (generalized)  Chronic pain of left  knee  Localized edema     Problem List Patient Active Problem List   Diagnosis Date Noted   Primary osteoarthritis of left knee 08/29/2021   Hip  mass, right 08/29/2021   Foot sprain, left, initial encounter 12/13/2020   Chronic anticoagulation 05/07/2020   Non-insulin dependent type 2 diabetes mellitus ([place]) 05/07/2020   Orthostatic hypotension 05/07/2020   Hematoma 05/07/2020   Degenerative arthritis of right elbow 12/15/2019   Paroxysmal atrial fibrillation ([place]) 05/30/2018   Positive colorectal cancer screening using DNA-based stool test 12/19/2016   Low bone mass 12/04/2016   Atrial fibrillation ([place]) 04/25/2016   Atrial flutter ([place]) 04/25/2016   Hyperlipidemia 04/25/2016   History of diabetes mellitus 05/20/2015   Mixed hyperlipidemia 05/20/2015   Essential hypertension 02/17/2015   Palpitations 10/17/2013   Heart murmur 10/15/2013   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/15/2013    Rennie Natter, PT, DPT  10/18/2021, 4:29 PM  [facility] [address]  [place], [place], [address] Phone: [phone]   Fax:  [phone]  Name: Ann Hobbs MRN: [id number] Date of Birth: 18-Mar-1946

## 2021-10-21 ENCOUNTER — Encounter: Payer: Self-pay | Admitting: Physical Therapy

## 2021-10-21 ENCOUNTER — Ambulatory Visit: Payer: Medicare Other | Admitting: Physical Therapy

## 2021-10-21 ENCOUNTER — Other Ambulatory Visit: Payer: Self-pay

## 2021-10-21 DIAGNOSIS — R600 Localized edema: Secondary | ICD-10-CM

## 2021-10-21 DIAGNOSIS — M6281 Muscle weakness (generalized): Secondary | ICD-10-CM

## 2021-10-21 DIAGNOSIS — G8929 Other chronic pain: Secondary | ICD-10-CM

## 2021-10-21 NOTE — Patient Instructions (Signed)
Access Code: [email or web address] URL: [email or web address] Date: 10/21/2021 Prepared by: Glenetta Hew  Exercises Single Leg Stance with Support - 1 x daily - 7 x weekly - 1 sets - 2 reps - 30 sec hold Tandem Stance with Support - 1 x daily - 7 x weekly - 1 sets - 2 reps - 30 sec hold

## 2021-10-21 NOTE — Therapy (Signed)
[facility] [address]  [place], [place], [address] Phone: [phone]   Fax:  [phone]  Physical Therapy Treatment PHYSICAL THERAPY DISCHARGE SUMMARY  Visits from Start of Care: 12  Current functional level related to goals / functional outcomes: Improved LE strength.  FOTO 51%.  Improved L knee pain, however easily exacerbated by activity still   Remaining deficits: R hip pain, L knee pain   Education / Equipment: HEP  Plan: Patient agrees to discharge.  Patient goals were not met. Patient is being discharged due to having surgery next week.   Will require new order to return to PT.       Patient Details  Name: Ann Hobbs MRN: [id number] Date of Birth: [birth year] Referring Provider (PT): Erskine Emery [facility]   Encounter Date: 10/21/2021   PT End of Session - 10/21/21 0852     Visit Number 12    Date for PT Re-Evaluation 11/03/21    Authorization Type MCR    Progress Note Due on Visit 18    PT Start Time 0849    PT Stop Time 0930    PT Time Calculation (min) 41 min    Activity Tolerance Patient tolerated treatment well    Behavior During Therapy [facility] for tasks assessed/performed             Past Medical History:  Diagnosis Date   A-fib [facility])    Brain lesion    Diabetes mellitus without complication ([place])    Dizziness    Elevated glucose    Essential hypertension 04/25/2016   Fall    Hematoma    Hyperlipidemia 04/25/2016   Hypertension    Paroxysmal atrial fibrillation ([place]) 04/25/2016   PONV (postoperative nausea and vomiting)    Typical atrial flutter ([place]) 04/25/2016   Weakness     Past Surgical History:  Procedure Laterality Date   APPENDECTOMY     ATRIAL FIBRILLATION ABLATION N/A 05/30/2018   Procedure: ATRIAL FIBRILLATION ABLATION;  Surgeon: Thompson Grayer, MD;  Location: [place] CV LAB;  Service: Cardiovascular;  Laterality: N/A;   CARDIOVERSION N/A 02/05/2018    Procedure: CARDIOVERSION;  Surgeon: Josue Hector, MD;  Location: [facility] ENDOSCOPY;  Service: Cardiovascular;  Laterality: N/A;   ELBOW FRACTURE SURGERY     KNEE ARTHROSCOPY     PARTIAL HYSTERECTOMY      There were no vitals filed for this visit.   Subjective Assessment - 10/21/21 0851     Subjective Patient reports her knee was doing well, but then woke up this morning with cramp in L shin starting knee running all the way down, had to jump out of bend and press down really hard to stop.    Pertinent History T2DM controlled by diet    Diagnostic tests MRI Lt knee: Small medial meniscus tear medial body; small tear posterior horn lateral meniscus    Patient Stated Goals to decrease pain and swelling    Currently in Pain? Yes    Pain Score 2     Pain Location Knee    Pain Orientation Left                [facility] PT Assessment - 10/21/21 0001       Assessment   Medical Diagnosis primary OA left knee    Referring Provider (PT) Erskine Emery [facility]    Onset Date/Surgical Date 06/30/21    Hand Dominance Right      Observation/Other Assessments  Focus on Therapeutic Outcomes (FOTO)  51      Single Leg Stance   Comments 30 sec bil                           [facility] Adult PT Treatment/Exercise - 10/21/21 0001       Knee/Hip Exercises: Stretches   Other Knee/Hip Stretches passive ant tib stretch L      Knee/Hip Exercises: Aerobic   Nustep L5 x 6 min      Knee/Hip Exercises: Standing   SLS 3 x 30 sec bil    Other Standing Knee Exercises tandem stance x 30 sec bil      Knee/Hip Exercises: Supine   Quad Sets Both;1 set;10 reps    Other Supine Knee/Hip Exercises glute sets both x 10, hamstring set x 10 both, 3 sec hold      Manual Therapy   Manual Therapy Soft tissue mobilization;Myofascial release    Manual therapy comments to decrease L knee pain/spasm    Soft tissue mobilization IASTM with stick to L ant tib, fibularis longus and brevis, L rectus and vastus  lateralis    Myofascial Release TPR to L tib ant and fibular longus    Other Manual Therapy skilled palpation and monitoring during dry needling              Trigger Point Dry Needling - 10/21/21 0001     Consent Given? Yes    Education Handout Provided Previously provided    Muscles Treated Lower Quadrant Rectus femoris;Vastus lateralis    Dry Needling Comments left    Rectus femoris Response Twitch response elicited;Palpable increased muscle length    Vastus lateralis Response Twitch response elicited;Palpable increased muscle length                   PT Education - 10/21/21 0935     Education Details HEP for tandem/single leg stance for balance.    Person(Ann) Educated Patient    Methods Explanation;Demonstration;Verbal cues;Handout    Comprehension Verbalized understanding;Returned demonstration                 PT Long Term Goals - 10/21/21 1031       PT LONG TERM GOAL #1   Title Patient to be independent with advanced HEP.    Status Achieved   10/06/21- met for current   Target Date 11/03/21      PT LONG TERM GOAL #2   Title Improved bil hip and knee strength to 5/5 and improved form with functional squats.    Time 6    Period Weeks    Status On-going   10/06/21- able to perform mini-squats but increased pain.   Target Date 11/03/21      PT LONG TERM GOAL #3   Title Pt able to enter/exit tub without difficulty and minimal left knee pain.    Time 4    Status On-going    Target Date 11/03/21      PT LONG TERM GOAL #4   Title Patient able to perform floor to stand transfer without UE assistance.    Baseline uses UE for balance    Time 4    Status On-going   10/06/21- demonstrated floor to stand transfer using chair to assist using R LE and LUE to assist movement.   Target Date 11/03/21      PT LONG TERM GOAL #5   Title Patient to demonstrate improved  SLS on the Lt LE to equal right.    Status Achieved   10/21/21- SLS x 30 sec bil   Target  Date 11/03/21                   Plan - 10/21/21 1028     Clinical Impression Statement Pt. reported severe muscle spasm in L ant tib/fibularis muscles, noted significant tightness and trigger points in quads as well.  Performed gentle LE strengthening and stretches followed by manual therapy to decrease muscle spasm, which helped significantly.  Discharging today as having surgery next week, reviewed gentle isometric LE exercises (quad set, glute sent, hamstring set) and recommended talking to MD after surgery about exerices and return to therapy.  Pt. has had difficulty making progress due to limitations from R hip pain, but today noted improvement in SLS on L, able to maintain for 30 sec, meeting LTG # 5.  Also given SLS and tandem stance for HEP.    Personal Factors and Comorbidities Comorbidity 2    Comorbidities DM, Afib    PT Frequency 2x / week    PT Duration 6 weeks    PT Treatment/Interventions ADLs/Self Care Home Management;Cryotherapy;Electrical Stimulation;Iontophoresis 54m/ml Dexamethasone;Moist Heat;Neuromuscular re-education;Balance training;Therapeutic exercise;Therapeutic activities;Functional mobility training;Stair training;Patient/family education;Manual techniques;Dry needling;Taping;Vasopneumatic Device    PT Next Visit Plan Work functional strength (squats, stairs, lunges, SLS activites), floor to stand transfers, modalities and manual therapy PRN - [place] medial knee to decrease pain.    PT Home Exercise Plan [id number]    Consulted and Agree with Plan of Care Patient             Patient will benefit from skilled therapeutic intervention in order to improve the following deficits and impairments:  Pain, Impaired flexibility, Decreased strength, Increased edema, Decreased balance, Decreased activity tolerance  Visit Diagnosis: Muscle weakness (generalized)  Chronic pain of left knee  Localized edema     Problem List Patient Active Problem List    Diagnosis Date Noted   Primary osteoarthritis of left knee 08/29/2021   Hip mass, right 08/29/2021   Foot sprain, left, initial encounter 12/13/2020   Chronic anticoagulation 05/07/2020   Non-insulin dependent type 2 diabetes mellitus ([place] 05/07/2020   Orthostatic hypotension 05/07/2020   Hematoma 05/07/2020   Degenerative arthritis of right elbow 12/15/2019   Paroxysmal atrial fibrillation ([place] 05/30/2018   Positive colorectal cancer screening using DNA-based stool test 12/19/2016   Low bone mass 12/04/2016   Atrial fibrillation ([place] 04/25/2016   Atrial flutter ([place] 04/25/2016   Hyperlipidemia 04/25/2016   History of diabetes mellitus 05/20/2015   Mixed hyperlipidemia 05/20/2015   Essential hypertension 02/17/2015   Palpitations 10/17/2013   Heart murmur 10/15/2013   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/15/2013    ERennie Natter PT, DPT  10/21/2021, 10:34 AM  [facility] [address] [place] [address]: [phone]  Fax:  [phone] Name: SCoti BurdSprinkles MRN: [id number] of Birth: 106/19/1947

## 2021-10-27 ENCOUNTER — Other Ambulatory Visit: Payer: Self-pay | Admitting: Orthopaedic Surgery

## 2021-10-27 DIAGNOSIS — R2241 Localized swelling, mass and lump, right lower limb: Secondary | ICD-10-CM | POA: Diagnosis not present

## 2021-10-27 MED ORDER — ONDANSETRON HCL 4 MG PO TABS: 4.0000 mg | ORAL_TABLET | Freq: Three times a day (TID) | ORAL | 1 refills | Status: DC | PRN

## 2021-10-27 MED ORDER — HYDROCODONE-ACETAMINOPHEN 5-325 MG PO TABS
1.0000 | ORAL_TABLET | Freq: Four times a day (QID) | ORAL | 0 refills | Status: DC | PRN
Start: 2021-10-27 — End: 2022-06-28

## 2021-11-01 ENCOUNTER — Encounter: Payer: Medicare Other | Admitting: Physical Therapy

## 2021-11-04 ENCOUNTER — Encounter: Payer: Medicare Other | Admitting: Physical Therapy

## 2021-11-10 ENCOUNTER — Other Ambulatory Visit: Payer: Self-pay

## 2021-11-10 ENCOUNTER — Telehealth: Payer: Self-pay

## 2021-11-10 ENCOUNTER — Ambulatory Visit (INDEPENDENT_AMBULATORY_CARE_PROVIDER_SITE_OTHER): Payer: Medicare Other | Admitting: Orthopaedic Surgery

## 2021-11-10 DIAGNOSIS — R2241 Localized swelling, mass and lump, right lower limb: Secondary | ICD-10-CM | POA: Diagnosis not present

## 2021-11-10 DIAGNOSIS — M1712 Unilateral primary osteoarthritis, left knee: Secondary | ICD-10-CM

## 2021-11-10 MED ORDER — HYLAN G-F 20 48 MG/6ML IX SOSY
48.0000 mg | PREFILLED_SYRINGE | INTRA_ARTICULAR | Status: AC | PRN
  Administered 2021-11-10: 48 mg via INTRA_ARTICULAR

## 2021-11-10 NOTE — Telephone Encounter (Signed)
VOB submitted for SynviscOne, bilateral knee BV pending 

## 2021-11-10 NOTE — Progress Notes (Signed)
The patient is 2 weeks status post removal of a old necrotic hematoma and seroma from her right proximal thigh and hip area.  She had sustained an injury about a year prior to that after a fall and developed a hematoma that state is a large area around the hip area.  We took her to the operating room and were able to remove this tissue and even some redundant skin.  She is doing well overall.  The incision looks good and the sutures been removed and Steri-Strips applied to right hip area.  There is some swelling but not to the extent that she had before.  I recommended alternating ice and heat.  She will also avoid high impact aerobic activities for the next 4 weeks.  I would like to see her back in 4 weeks to see how she is doing overall.  Of note we are still following her for primary arthritis of her left knee which is osteoarthritis.  A hyaluronic acid injection has been ordered already to treat the pain from osteoarthritis at this left knee given the very conservative treatment including steroid injections.  Procedure Note  Patient: Ann Hobbs             Date of Birth: 1946/01/11           MRN: [id number]             Visit Date: 11/10/2021  Procedures: Visit Diagnoses:  1. Hip mass, right   2. Primary osteoarthritis of left knee     Large Joint Inj: L knee on 11/10/2021 1:48 PM Indications: pain and diagnostic evaluation Details: 22 G 1.5 in needle, superolateral approach  Arthrogram: No  Medications: 48 mg Hylan 48 MG/6ML Outcome: tolerated well, no immediate complications Procedure, treatment alternatives, risks and benefits explained, specific risks discussed. Consent was given by the patient. Immediately prior to procedure a time out was called to verify the correct patient, procedure, equipment, support staff and site/side marked as required. Patient was prepped and draped in the usual sterile fashion.    We did place Synvisc 1 in her left knee today to treat the pain  from osteoarthritis.  She has taken some type of over-the-counter anti-inflammatory as well now that is helping.  She did tolerate this well.  We will see her back in 4 weeks to see how her left knee is doing in her right proximal femur is doing from the removal of the mass.

## 2021-11-16 ENCOUNTER — Telehealth: Payer: Self-pay

## 2021-11-16 NOTE — Telephone Encounter (Signed)
Approved for SynvisOne, bilateral knee. Buy & Bill Must meet Medicare deductible  Secondary insurance [employer] [place] will cover remaining coinsurance No Co-pay  No PA required

## 2021-11-16 NOTE — Telephone Encounter (Signed)
Updated note for gel injection under 11/10/2021.

## 2021-12-12 ENCOUNTER — Telehealth: Payer: Self-pay | Admitting: Orthopaedic Surgery

## 2021-12-12 ENCOUNTER — Encounter: Payer: Self-pay | Admitting: Orthopaedic Surgery

## 2021-12-12 ENCOUNTER — Ambulatory Visit (INDEPENDENT_AMBULATORY_CARE_PROVIDER_SITE_OTHER): Payer: Medicare Other | Admitting: Orthopaedic Surgery

## 2021-12-12 DIAGNOSIS — M25562 Pain in left knee: Secondary | ICD-10-CM

## 2021-12-12 DIAGNOSIS — G8929 Other chronic pain: Secondary | ICD-10-CM | POA: Diagnosis not present

## 2021-12-12 DIAGNOSIS — M1712 Unilateral primary osteoarthritis, left knee: Secondary | ICD-10-CM | POA: Diagnosis not present

## 2021-12-12 DIAGNOSIS — R2241 Localized swelling, mass and lump, right lower limb: Secondary | ICD-10-CM | POA: Diagnosis not present

## 2021-12-12 NOTE — Progress Notes (Signed)
The patient returns over a month out from surgery on a chronic right hip proximal mass that was more of a hematoma.  She said that she is doing much better overall from that standpoint.  Also she is in follow-up for left knee osteoarthritis.  She had a hyaluronic acid injection a month ago at her last visit.  She says her knee is even doing better overall. ? ?On exam her left knee has slight varus malalignment and slight medial joint line tenderness but moves well with no effusion.  I did assess her right hip incision in that area looks much improved overall. ? ?Since she is doing so well follow-up can be as needed.  All questions and concerns were answered and addressed.  If things flareup on her at any way in terms of her right hip or left knee she knows to come in for an evaluation. ?

## 2021-12-12 NOTE — Telephone Encounter (Signed)
Pt stated she forgot to tell provider at her appt that this is the medication she takes at home to help with swelling "High potency Serrapeptase 120,000 SPU" ?

## 2021-12-12 NOTE — Telephone Encounter (Signed)
FYI

## 2022-01-04 ENCOUNTER — Telehealth (HOSPITAL_COMMUNITY): Payer: Self-pay

## 2022-01-04 ENCOUNTER — Ambulatory Visit (HOSPITAL_COMMUNITY)
Admission: RE | Admit: 2022-01-04 | Discharge: 2022-01-04 | Disposition: A | Discharge status: Home / Self Care | Payer: Medicare Other | Source: Ambulatory Visit | Attending: Nurse Practitioner | Admitting: Nurse Practitioner

## 2022-01-04 ENCOUNTER — Other Ambulatory Visit: Payer: Self-pay

## 2022-01-04 ENCOUNTER — Encounter (HOSPITAL_COMMUNITY): Payer: Self-pay | Admitting: Nurse Practitioner

## 2022-01-04 VITALS — BP 176/80 | HR 80 | Ht 65.0 in | Wt 166.8 lb

## 2022-01-04 DIAGNOSIS — I10 Essential (primary) hypertension: Secondary | ICD-10-CM | POA: Diagnosis not present

## 2022-01-04 DIAGNOSIS — I4891 Unspecified atrial fibrillation: Secondary | ICD-10-CM | POA: Diagnosis present

## 2022-01-04 DIAGNOSIS — I493 Ventricular premature depolarization: Secondary | ICD-10-CM | POA: Diagnosis not present

## 2022-01-04 DIAGNOSIS — I480 Paroxysmal atrial fibrillation: Secondary | ICD-10-CM

## 2022-01-04 MED ORDER — METOPROLOL SUCCINATE ER 25 MG PO TB24: ORAL_TABLET | ORAL | 3 refills | Status: DC

## 2022-01-04 NOTE — Progress Notes (Signed)
? ?Primary Care Physician: Berkley Harvey, NP ?Referring Physician: Dr. Rayann Heman ? ? ?Ann Hobbs is a 76 y.o. female with a h/o paroxysmal afib s/p ablation 9/22. She is here for f/u afib.  S/p afib ablation 2019. Had a prior fall /hip hematoma mentioned on prior visit with Dr. Rayann Heman and she did not want to resume anticoagulation with CHA2DS2VASc  of 3. He mentioned  that Watchman could be a possibility at a later date if needed. She  states today, that she has not had any sustained afib. She is in SR today with PVC's today. She has not noted any afib but has concerns re her BP that has been elevated. She was tried on a BP agent in the winter that made her dizzy so she stopped it. Cannot remember the name of the agent.  ? ?Today, she denies symptoms of palpitations, chest pain, shortness of breath, orthopnea, PND, lower extremity edema, dizziness, presyncope, syncope, or neurologic sequela. The patient is tolerating medications without difficulties and is otherwise without complaint today.  ? ?Past Medical History:  ?Diagnosis Date  ? A-fib ([place])   ? Brain lesion   ? Diabetes mellitus without complication ([place])   ? Dizziness   ? Elevated glucose   ? Essential hypertension 04/25/2016  ? Fall   ? Hematoma   ? Hyperlipidemia 04/25/2016  ? Hypertension   ? Paroxysmal atrial fibrillation ([place]) 04/25/2016  ? PONV (postoperative nausea and vomiting)   ? Typical atrial flutter ([place]) 04/25/2016  ? Weakness   ? ?Past Surgical History:  ?Procedure Laterality Date  ? APPENDECTOMY    ? ATRIAL FIBRILLATION ABLATION N/A 05/30/2018  ? Procedure: ATRIAL FIBRILLATION ABLATION;  Surgeon: Thompson Grayer, MD;  Location: [facility];  Service: Cardiovascular;  Laterality: N/A;  ? CARDIOVERSION N/A 02/05/2018  ? Procedure: CARDIOVERSION;  Surgeon: Josue Hector, MD;  Location: [facility] ENDOSCOPY;  Service: Cardiovascular;  Laterality: N/A;  ? ELBOW FRACTURE SURGERY    ? KNEE ARTHROSCOPY    ? PARTIAL HYSTERECTOMY    ? ? ?Current  Outpatient Medications  ?Medication Sig Dispense Refill  ? Acetaminophen (ACETAMIN PO) Take 650 mg by mouth as needed.    ? Ascorbic Acid (VITAMIN C) 1000 MG tablet Take 1,000 mg by mouth daily.    ? atorvastatin (LIPITOR) 20 MG tablet TAKE ONE (1) TABLET BY MOUTH EACH DAY AT6PM 90 tablet 3  ? b complex vitamins capsule Take 1 capsule by mouth daily.    ? Blood Glucose Monitoring Suppl (GHT BLOOD GLUCOSE MONITOR) w/Device KIT Apply 1 strip topically as directed.    ? Calcium Carb-Cholecalciferol 600-800 MG-UNIT TABS Take 2 tablets by mouth 2 (two) times daily.     ? Cholecalciferol (VITAMIN D-3 PO) Take 1 tablet by mouth every morning.    ? clobetasol cream (TEMOVATE) 8.52 % Apply 1 application topically 2 (two) times daily.    ? estradiol (ESTRACE) 0.1 MG/GM vaginal cream Place 1 Applicatorful vaginally at bedtime.    ? fluorouracil (EFUDEX) 5 % cream Apply topically 2 (two) times daily.    ? HYDROcodone-acetaminophen (NORCO/VICODIN) 5-325 MG tablet Take 1-2 tablets by mouth every 6 (six) hours as needed for moderate pain. 30 tablet 0  ? mupirocin ointment (BACTROBAN) 2 % APPLY TO AFFECTED AREA TWICE A DAY    ? nystatin (MYCOSTATIN/NYSTOP) powder APPLY TO AFFECTED AREA TWICE A DAY    ? ReliOn Ultra Thin Lancets MISC Apply 1 strip topically as directed.    ? metoprolol  succinate (TOPROL-XL) 25 MG 24 hr tablet Take 1/2 tablet by mouth at bedtime 30 tablet 3  ? ?No current facility-administered medications for this encounter.  ? ? ?Allergies  ?Allergen Reactions  ? Codeine Nausea And Vomiting  ? Lisinopril   ?  Caused her to feel dizzy  ? Prednisone Other (See Comments)  ?  Made patient overly jittery, Can tolerate in low doses for short periods of time  ? ? ?Social History  ? ?Socioeconomic History  ? Marital status: Married  ?  Spouse name: Not on file  ? Number of children: Not on file  ? Years of education: Not on file  ? Highest education level: Not on file  ?Occupational History  ? Not on file  ?Tobacco Use  ?  Smoking status: Never  ? Smokeless tobacco: Never  ?Vaping Use  ? Vaping Use: Never used  ?Substance and Sexual Activity  ? Alcohol use: Yes  ?  Alcohol/week: 1.0 standard drink  ?  Types: 1 Standard drinks or equivalent per week  ?  Comment: occasional social  ? Drug use: Never  ? Sexual activity: Not on file  ?Other Topics Concern  ? Not on file  ?Social History Narrative  ? Lives with spouse  ? Retired Surveyor, quantity  ? ?Social Determinants of Health  ? ?Financial Resource Strain: Not on file  ?Food Insecurity: Not on file  ?Transportation Needs: Not on file  ?Physical Activity: Not on file  ?Stress: Not on file  ?Social Connections: Not on file  ?Intimate Partner Violence: Not on file  ? ? ?Family History  ?Problem Relation Age of Onset  ? Heart disease Mother   ?     PACER  ? Macular degeneration Mother   ? Cancer Father   ? Diabetes Sister   ? Heart disease Maternal Grandmother   ? ? ?ROS- All systems are reviewed and negative except as per the HPI above ? ?Physical Exam: ?Vitals:  ? 01/04/22 0957  ?BP: (!) 176/80  ?Pulse: 80  ?Weight: 75.7 kg  ?Height: $RemoveB'5\' 5"'BCGdKBKr$  (1.651 m)  ? ? ?Wt Readings from Last 3 Encounters:  ?01/04/22 75.7 kg  ?07/04/21 74.8 kg  ?01/03/21 78.8 kg  ? ? ?Labs: ?Lab Results  ?Component Value Date  ? NA 143 05/06/2018  ? K 4.8 05/06/2018  ? CL 104 05/06/2018  ? CO2 25 05/06/2018  ? GLUCOSE 93 05/06/2018  ? BUN 18 05/06/2018  ? CREATININE 0.86 05/06/2018  ? CALCIUM 9.8 05/06/2018  ? MG 2.2 01/27/2018  ? ?No results found for: INR ?No results found for: CHOL, HDL, LDLCALC, TRIG ? ? ?GEN- The patient is well appearing, alert and oriented x 3 today.   ?Head- normocephalic, atraumatic ?Eyes-  Sclera clear, conjunctiva pink ?Ears- hearing intact ?Oropharynx- clear ?Neck- supple, no JVP ?Lymph- no cervical lymphadenopathy ?Lungs- Clear to ausculation bilaterally, normal work of breathing ?Heart- Regular rate and rhythm, no murmurs, rubs or gallops, PMI not laterally displaced ?GI- soft, NT, ND,  + BS ?Extremities- no clubbing, cyanosis, or edema, fist size mass rt groin  ?MS- no significant deformity or atrophy ?Skin- no rash or lesion ?Psych- euthymic mood, full affect ?Neuro- strength and sensation are intact ? ?EKG-NSR  ? ?Epic records reviewed  ? ? ?Assessment and Plan: ?1. Afib  ?S/p ablation 2019 ?She is doing well, no afib burden described  ? ?2. CHA2DS2VASc score of at least 3 ?Pt wishes at this time not to be on anticoagulation  ? ?  3. PVC's ?In bigeminal rhythm today  ?In a prior note from Dr. Rayann Heman he suggested to use BB to blunt the PVC's  ? ?4. HTN ?Pt will let me know what drug she used in the past for BP as I  want to avoid making her feel dizzy ?She called back to the office and reported it was lisinopril 5 mg daily  ?With bigeminal PVC's and HTN,  I will try 12.5 mg toprol ER at hs  ? ?I will see back in one week to see effects of above with EKG and BP check ? ? ?Geroge Baseman Kayleen Memos, ANP-C ?Afib Clinic ?[facility] ?[address] ?[place], [place] [address] ?[phone] ? ? ? ? ?

## 2022-01-04 NOTE — Telephone Encounter (Signed)
Patient called in to notify Rudi Coco NP regarding her blood pressure medication. She states she has been on Lisinopril in the past. Lisinopril caused her to have dizziness so she stopped the medication per her pcp. Consulted with Rudi Coco NP and she would like her to start Metoprolol 25mg  tablet-taking 1/2 tablet by mouth at bedtime. This medication should help with her PVCS and should help with her high blood pressure. Appt scheduled for 01/12/22 @ 2:00pm to come back for follow up EKG and blood pressure check. Consulted with patient and she verbalized understanding.  ?

## 2022-01-12 ENCOUNTER — Ambulatory Visit (HOSPITAL_COMMUNITY)
Admission: RE | Admit: 2022-01-12 | Discharge: 2022-01-12 | Disposition: A | Discharge status: Home / Self Care | Payer: Medicare Other | Source: Ambulatory Visit | Attending: Nurse Practitioner | Admitting: Nurse Practitioner

## 2022-01-12 VITALS — BP 160/98 | HR 94

## 2022-01-12 DIAGNOSIS — R002 Palpitations: Secondary | ICD-10-CM | POA: Diagnosis not present

## 2022-01-12 DIAGNOSIS — Z79899 Other long term (current) drug therapy: Secondary | ICD-10-CM | POA: Diagnosis not present

## 2022-01-12 DIAGNOSIS — I10 Essential (primary) hypertension: Secondary | ICD-10-CM | POA: Insufficient documentation

## 2022-01-12 NOTE — Progress Notes (Addendum)
In for EKG and BP for addition of Toprol 12.5 mg bid for HTN and PC's. She states that her BP at home 104 systolic at home this  am. Elevated at 160/98 here. Recheck 152/86. Tolerating toprol ok, describes when she awakes in the am, she feels her breathing is "deliberate". Later on in the am/ day breathing is normal. Will place a 10 day Zio patch to see PC burden.  ?Continue toprol at current dose  and keep checking BP at home.  ?EKG shows SR with frequent PC's. ?

## 2022-01-12 NOTE — Addendum Note (Signed)
Encounter addended by: Learta Codding, CMA on: 01/12/2022 3:39 PM ? Actions taken: Vitals modified

## 2022-02-03 ENCOUNTER — Other Ambulatory Visit (HOSPITAL_COMMUNITY): Payer: Self-pay | Admitting: *Deleted

## 2022-02-03 ENCOUNTER — Telehealth (HOSPITAL_COMMUNITY): Payer: Self-pay | Admitting: *Deleted

## 2022-02-03 DIAGNOSIS — I480 Paroxysmal atrial fibrillation: Secondary | ICD-10-CM

## 2022-02-03 NOTE — Telephone Encounter (Signed)
-----   Message from Newman Nip, NP sent at 02/02/2022  8:26 AM EDT ----- ?Please let pt know that she has a lot of PVC's 40% burden. No afib. I discussed with Dr. Elberta Fortis in lieu of Dr. Johney Frame leaving and he suggested to get an echo to see if PVC's are having a negative effect on his EF and then he would be happy to talk to her to discuss management  ?

## 2022-02-03 NOTE — Telephone Encounter (Signed)
Pt notified. Echo ordered.  ?

## 2022-02-16 ENCOUNTER — Ambulatory Visit (HOSPITAL_COMMUNITY)
Admission: RE | Admit: 2022-02-16 | Discharge: 2022-02-16 | Disposition: A | Discharge status: Home / Self Care | Payer: Medicare Other | Source: Ambulatory Visit | Attending: Nurse Practitioner | Admitting: Nurse Practitioner

## 2022-02-16 DIAGNOSIS — E785 Hyperlipidemia, unspecified: Secondary | ICD-10-CM | POA: Insufficient documentation

## 2022-02-16 DIAGNOSIS — I10 Essential (primary) hypertension: Secondary | ICD-10-CM | POA: Insufficient documentation

## 2022-02-16 DIAGNOSIS — E119 Type 2 diabetes mellitus without complications: Secondary | ICD-10-CM | POA: Insufficient documentation

## 2022-02-16 DIAGNOSIS — I480 Paroxysmal atrial fibrillation: Secondary | ICD-10-CM | POA: Diagnosis not present

## 2022-02-16 DIAGNOSIS — I4891 Unspecified atrial fibrillation: Secondary | ICD-10-CM | POA: Diagnosis not present

## 2022-02-16 LAB — ECHOCARDIOGRAM COMPLETE
Calc EF: 46.1 %
MV M vel: 5.08 m/s
MV Peak grad: 103.2 mmHg
S' Lateral: 3.9 cm
Single Plane A2C EF: 36.2 %
Single Plane A4C EF: 53.4 %

## 2022-02-21 ENCOUNTER — Encounter (HOSPITAL_COMMUNITY): Payer: Self-pay | Admitting: *Deleted

## 2022-06-28 ENCOUNTER — Ambulatory Visit (HOSPITAL_COMMUNITY)
Admission: RE | Admit: 2022-06-28 | Discharge: 2022-06-28 | Disposition: A | Discharge status: Home / Self Care | Payer: Medicare Other | Source: Ambulatory Visit | Attending: Nurse Practitioner | Admitting: Nurse Practitioner

## 2022-06-28 VITALS — BP 140/80 | HR 84 | Ht 65.0 in | Wt 156.8 lb

## 2022-06-28 DIAGNOSIS — Z79899 Other long term (current) drug therapy: Secondary | ICD-10-CM | POA: Insufficient documentation

## 2022-06-28 DIAGNOSIS — I480 Paroxysmal atrial fibrillation: Secondary | ICD-10-CM | POA: Insufficient documentation

## 2022-06-28 DIAGNOSIS — I10 Essential (primary) hypertension: Secondary | ICD-10-CM | POA: Insufficient documentation

## 2022-06-28 DIAGNOSIS — I493 Ventricular premature depolarization: Secondary | ICD-10-CM | POA: Diagnosis not present

## 2022-06-28 NOTE — Patient Instructions (Signed)
Resume metoprolol 12.5mg  once a day (1/2 of the 25mg  tablet)  Follow up with Dr. Curt Bears - his scheduler will contact you

## 2022-06-28 NOTE — Progress Notes (Signed)
Primary Care Physician: Ann Harvey, NP Referring Physician: Dr. Abran Cantor Ann Hobbs is a 75 y.o. female with a h/o paroxysmal afib s/p ablation 9/22. She is here for f/u afib.  S/p afib ablation 2019. Had a prior fall /hip hematoma mentioned on prior visit with Dr. Rayann Hobbs and she did not want to resume anticoagulation with CHA2DS2VASc  of 3. He mentioned  that Watchman could be a possibility at a later date if needed. She  states today, that she has not had any sustained afib. She is in SR today with PVC's today. She has not noted any afib but has concerns re her BP that has been elevated. She was tried on a BP agent in the winter that made her dizzy so she stopped it. Cannot remember the name of the agent.   F/u in afib clinic, 06/28/22. She is in afib clinic for f/u. A monitor showed on last visit  in April 40% afib burden. I discussed with Dr. Curt Hobbs and he suggested an echo and if any negative consequence on EF, he would be glad to see her. Echo showed normal EF. She was started on Toprol 12.5 mg at hs.    She is in bigeminal pvc pattern today. For unclear reason, when she received echo report that showed normal EF in the setting of 40% ectopy burden, she stopped Toprol. We discussed it would be best to be on this to suppress PVC burden. She does get lightheaded at times. I will refer to Dr. Curt Hobbs for further  review of PVC burden and other means to control  that may be  indicated other than resuming BB, which will be done today.. She has not had any afib. She continues to defer anticoagulation.   Today, she denies symptoms of palpitations, chest pain, shortness of breath, orthopnea, PND, lower extremity edema, dizziness, presyncope, syncope, or neurologic sequela. The patient is tolerating medications without difficulties and is otherwise without complaint today.   Past Medical History:  Diagnosis Date   A-fib [facility])    Brain lesion    Diabetes mellitus without complication  ([place])    Dizziness    Elevated glucose    Essential hypertension 04/25/2016   Fall    Hematoma    Hyperlipidemia 04/25/2016   Hypertension    Paroxysmal atrial fibrillation ([place]) 04/25/2016   PONV (postoperative nausea and vomiting)    Typical atrial flutter ([place]) 04/25/2016   Weakness    Past Surgical History:  Procedure Laterality Date   APPENDECTOMY     ATRIAL FIBRILLATION ABLATION N/A 05/30/2018   Procedure: ATRIAL FIBRILLATION ABLATION;  Surgeon: Ann Grayer, MD;  Location: [facility];  Service: Cardiovascular;  Laterality: N/A;   CARDIOVERSION N/A 02/05/2018   Procedure: CARDIOVERSION;  Surgeon: Ann Hector, MD;  Location: [facility] ENDOSCOPY;  Service: Cardiovascular;  Laterality: N/A;   ELBOW FRACTURE SURGERY     KNEE ARTHROSCOPY     PARTIAL HYSTERECTOMY      Current Outpatient Medications  Medication Sig Dispense Refill   Acetaminophen (ACETAMIN PO) Take 650 mg by mouth as needed.     Ascorbic Acid (VITAMIN C) 1000 MG tablet Take 1,000 mg by mouth daily.     atorvastatin (LIPITOR) 20 MG tablet TAKE ONE (1) TABLET BY MOUTH EACH DAY AT6PM 90 tablet 3   b complex vitamins capsule Take 1 capsule by mouth daily.     Blood Glucose Monitoring Suppl (GHT BLOOD GLUCOSE MONITOR) w/Device KIT Apply 1 strip  topically as directed.     Calcium Carb-Cholecalciferol 600-800 MG-UNIT TABS Take 2 tablets by mouth 2 (two) times daily.      Cholecalciferol (VITAMIN D-3 PO) Take 1 tablet by mouth every morning.     clobetasol cream (TEMOVATE) 5.82 % Apply 1 application topically 2 (two) times daily.     estradiol (ESTRACE) 0.1 MG/GM vaginal cream Place 1 Applicatorful vaginally at bedtime.     fluorouracil (EFUDEX) 5 % cream Apply topically 2 (two) times daily.     HYDROcodone-acetaminophen (NORCO/VICODIN) 5-325 MG tablet Take 1-2 tablets by mouth every 6 (six) hours as needed for moderate pain. 30 tablet 0   metoprolol succinate (TOPROL-XL) 25 MG 24 hr tablet Take 1/2 tablet by mouth at  bedtime 30 tablet 3   mupirocin ointment (BACTROBAN) 2 % APPLY TO AFFECTED AREA TWICE A DAY     nystatin (MYCOSTATIN/NYSTOP) powder APPLY TO AFFECTED AREA TWICE A DAY     ReliOn Ultra Thin Lancets MISC Apply 1 strip topically as directed.     No current facility-administered medications for this encounter.    Allergies  Allergen Reactions   Codeine Nausea And Vomiting   Lisinopril     Caused her to feel dizzy   Prednisone Other (See Comments)    Made patient overly jittery, Can tolerate in low doses for short periods of time    Social History   Socioeconomic History   Marital status: Married    Spouse name: Not on file   Number of children: Not on file   Years of education: Not on file   Highest education level: Not on file  Occupational History   Not on file  Tobacco Use   Smoking status: Never   Smokeless tobacco: Never  Vaping Use   Vaping Use: Never used  Substance and Sexual Activity   Alcohol use: Yes    Alcohol/week: 1.0 standard drink of alcohol    Types: 1 Standard drinks or equivalent per week    Comment: occasional social   Drug use: Never   Sexual activity: Not on file  Other Topics Concern   Not on file  Social History Narrative   Lives with spouse   Retired Surveyor, quantity   Social Determinants of Health   Financial Resource Strain: Not on file  Food Insecurity: Not on file  Transportation Needs: Not on file  Physical Activity: Not on file  Stress: Not on file  Social Connections: Not on file  Intimate Partner Violence: Not on file    Family History  Problem Relation Age of Onset   Heart disease Mother        PACER   Macular degeneration Mother    Cancer Father    Diabetes Sister    Heart disease Maternal Grandmother     ROS- All systems are reviewed and negative except as per the HPI above  Physical Exam: Vitals:   06/28/22 1038  Weight: 71.1 kg  Height: 5' 5" (1.651 m)    Wt Readings from Last 3 Encounters:  06/28/22  71.1 kg  01/04/22 75.7 kg  07/04/21 74.8 kg    Labs: Lab Results  Component Value Date   NA 143 05/06/2018   K 4.8 05/06/2018   CL 104 05/06/2018   CO2 25 05/06/2018   GLUCOSE 93 05/06/2018   BUN 18 05/06/2018   CREATININE 0.86 05/06/2018   CALCIUM 9.8 05/06/2018   MG 2.2 01/27/2018   No results found for: "INR" No results found  for: "CHOL", "HDL", "LDLCALC", "TRIG"   GEN- The patient is well appearing, alert and oriented x 3 today.   Head- normocephalic, atraumatic Eyes-  Sclera clear, conjunctiva pink Ears- hearing intact Oropharynx- clear Neck- supple, no JVP Lymph- no cervical lymphadenopathy Lungs- Clear to ausculation bilaterally, normal work of breathing Heart- Regular rate and irregular  rhythm( PVC's) , no murmurs, rubs or gallops, PMI not laterally displaced GI- soft, NT, ND, + BS Extremities- no clubbing, cyanosis, or edema, fist size mass rt groin  MS- no significant deformity or atrophy Skin- no rash or lesion Psych- euthymic mood, full affect Neuro- strength and sensation are intact  EKG-Vent. rate 84 BPM PR interval 162 ms QRS duration 84 ms QT/QTcB 404/477 ms P-R-T axes 48 83 32 Sinus rhythm with frequent Premature ventricular complexes in a pattern of bigeminy Nonspecific ST and T wave abnormality Prolonged QT Abnormal ECG When compared with ECG of 12-Jan-2022 14:56, PREVIOUS ECG IS PRESENT  Echo- Left ventricular ejection fraction, by estimation, is 55%. The left ventricle has normal function. The left ventricle has no regional wall motion abnormalities. Left ventricular diastolic parameters are indeterminate. 1. Right ventricular systolic function is normal. The right ventricular size is normal. There is moderately elevated pulmonary artery systolic pressure. 2. 3. Left atrial size was mildly dilated. 4. Right atrial size was mildly dilated. 5. The mitral valve is normal in structure. Mild mitral valve regurgitation. 6. The aortic valve  is normal in structure. Aortic valve regurgitation is mild. The inferior vena cava is dilated in size with <50% respiratory variability, suggesting right atrial pressure of 15 mmHg. 7. Comparison(s): The left ventricular function is unchanged.  Epic records reviewed    Assessment and Plan: 1. Afib  S/p ablation 2019 She is doing well, no afib burden described   2. CHA2DS2VASc score of at least 3 Pt wishes at this time not to be on anticoagulation   3. PVC's In bigeminal rhythm today Resume Toprol 12.5 mg at hs, pt  earlier erroneously stopped this when she heard EF was ok by echo   4. HTN Stable   I will refer to Dr. Shonna Chock for further evaluation of ventricular ectopy burden    Ann Hobbs, [facility] [address] [place], [place] [address] [phone]

## 2022-07-06 ENCOUNTER — Other Ambulatory Visit: Payer: Self-pay

## 2022-07-06 ENCOUNTER — Emergency Department (HOSPITAL_BASED_OUTPATIENT_CLINIC_OR_DEPARTMENT_OTHER)
Admission: EM | Admit: 2022-07-06 | Discharge: 2022-07-06 | Disposition: A | Discharge status: Home / Self Care | Payer: Medicare Other | Attending: Emergency Medicine | Admitting: Emergency Medicine

## 2022-07-06 ENCOUNTER — Encounter (HOSPITAL_BASED_OUTPATIENT_CLINIC_OR_DEPARTMENT_OTHER): Payer: Self-pay

## 2022-07-06 DIAGNOSIS — R002 Palpitations: Secondary | ICD-10-CM | POA: Insufficient documentation

## 2022-07-06 DIAGNOSIS — R110 Nausea: Secondary | ICD-10-CM | POA: Diagnosis not present

## 2022-07-06 DIAGNOSIS — R42 Dizziness and giddiness: Secondary | ICD-10-CM | POA: Insufficient documentation

## 2022-07-06 LAB — CBC
HCT: 37.3 % (ref 36.0–46.0)
Hemoglobin: 12.3 g/dL (ref 12.0–15.0)
MCH: 31.9 pg (ref 26.0–34.0)
MCHC: 33 g/dL (ref 30.0–36.0)
MCV: 96.6 fL (ref 80.0–100.0)
Platelets: 137 10*3/uL — ABNORMAL LOW (ref 150–400)
RBC: 3.86 MIL/uL — ABNORMAL LOW (ref 3.87–5.11)
RDW: 14.7 % (ref 11.5–15.5)
WBC: 9.1 10*3/uL (ref 4.0–10.5)
nRBC: 0 % (ref 0.0–0.2)

## 2022-07-06 LAB — BASIC METABOLIC PANEL
Anion gap: 5 (ref 5–15)
BUN: 12 mg/dL (ref 8–23)
CO2: 28 mmol/L (ref 22–32)
Calcium: 8.8 mg/dL — ABNORMAL LOW (ref 8.9–10.3)
Chloride: 103 mmol/L (ref 98–111)
Creatinine, Ser: 0.6 mg/dL (ref 0.44–1.00)
GFR, Estimated: >60 mL/min (ref >60)
Glucose, Bld: 149 mg/dL — ABNORMAL HIGH (ref 70–99)
Potassium: 4.5 mmol/L (ref 3.5–5.1)
Sodium: 136 mmol/L (ref 135–145)

## 2022-07-06 LAB — TROPONIN I (HIGH SENSITIVITY): Troponin I (High Sensitivity): 4 ng/L (ref <18)

## 2022-07-06 MED ORDER — PANTOPRAZOLE SODIUM 40 MG IV SOLR: 40.0000 mg | Freq: Once | INTRAVENOUS | Status: DC

## 2022-07-06 NOTE — ED Provider Notes (Signed)
[facility] EMERGENCY DEPARTMENT Provider Note   CSN: [id number] Arrival date & time: 07/06/22  1710     History  Chief Complaint  Patient presents with   Palpitations   Nausea    Ann Hobbs is a 76 y.o. female.  Patient here with palpitation, episode of dizziness this morning after taking her oxycodone.  She had recent right shoulder surgery.  After taking her dose of pain medicine this morning she felt lightheaded and dizzy and nauseous.  Symptoms have improved as the day has gone on.  She is no longer feeling dizzy or lightheaded.  Still with some palpitations at time.  History of A-fib but status post ablation.  Denies any headache, fever, chills, weakness, numbness, vision changes, speech changes.  The history is provided by the patient.       Home Medications Prior to Admission medications   Medication Sig Start Date End Date Taking? Authorizing Provider  Acetaminophen (ACETAMIN PO) Take 650 mg by mouth as needed.    [provider]  Ascorbic Acid (VITAMIN C) 1000 MG tablet Take 1,000 mg by mouth daily.    [provider]  atorvastatin (LIPITOR) 20 MG tablet TAKE ONE (1) TABLET BY MOUTH EACH DAY AT6PM 08/05/21   Allred, Jeneen Rinks, MD  b complex vitamins capsule Take 1 capsule by mouth daily.    [provider]  Blood Glucose Monitoring Suppl (GHT BLOOD GLUCOSE MONITOR) w/Device KIT Apply 1 strip topically as directed. 02/17/15   [provider]  Brimonidine Tartrate (MIRVASO) 0.33 % GEL Apply topically daily. 02/03/22   [provider]  Cholecalciferol (VITAMIN D-3 PO) Take 1 tablet by mouth every morning.    [provider]  clotrimazole-betamethasone (LOTRISONE) cream Apply topically daily as needed. 02/03/22   [provider]  estradiol (ESTRACE) 0.1 MG/GM vaginal cream Place 1 Applicatorful vaginally at bedtime.    [provider]  fluorouracil (EFUDEX) 5 % cream Apply topically 2 (two)  times daily. 01/25/21   [provider]  fluticasone Asencion Islam) 50 MCG/ACT nasal spray SMARTSIG:1 Spray(s) Both Nares Daily PRN 04/13/22   [provider]  metoprolol succinate (TOPROL-XL) 25 MG 24 hr tablet Take 1/2 tablet by mouth at bedtime Patient not taking: Reported on 06/28/2022 01/04/22   Sherran Needs, NP  ReliOn Ultra Thin Lancets MISC Apply 1 strip topically as directed. 02/17/15   [provider]      Allergies    Codeine, Lisinopril, and Prednisone    Review of Systems   Review of Systems  Physical Exam Updated Vital Signs BP (!) 151/60   Pulse (!) 44   Temp 98.3 F (36.8 C) (Oral)   Resp 16   Ht _0  (1.651 m)   Wt 70.8 kg   SpO2 98%   BMI 25.96 kg/m  Physical Exam Vitals and nursing note reviewed.  Constitutional:      General: She is not in acute distress.    Appearance: She is well-developed. She is not ill-appearing.  HENT:     Head: Normocephalic and atraumatic.     Nose: Nose normal.     Mouth/Throat:     Mouth: Mucous membranes are moist.  Eyes:     Extraocular Movements: Extraocular movements intact.     Conjunctiva/sclera: Conjunctivae normal.     Pupils: Pupils are equal, round, and reactive to light.  Cardiovascular:     Rate and Rhythm: Normal rate and regular rhythm.     Pulses: Normal pulses.  Heart sounds: Normal heart sounds. No murmur heard. Pulmonary:     Effort: Pulmonary effort is normal. No respiratory distress.     Breath sounds: Normal breath sounds.  Abdominal:     Palpations: Abdomen is soft.     Tenderness: There is no abdominal tenderness.  Musculoskeletal:        General: No swelling.     Cervical back: Normal range of motion and neck supple.  Skin:    General: Skin is warm and dry.     Capillary Refill: Capillary refill takes less than 2 seconds.  Neurological:     General: No focal deficit present.     Mental Status: She is alert and oriented to person, place, and time.     Cranial Nerves:  No cranial nerve deficit.     Sensory: No sensory deficit.     Motor: No weakness.     Coordination: Coordination normal.     Comments: 5+ out of 5 strength throughout, normal sensation, no drift, normal finger-nose-finger, normal speech, normal visual fields  Psychiatric:        Mood and Affect: Mood normal.     ED Results / Procedures / Treatments   Labs (all labs ordered are listed, but only abnormal results are displayed) Labs Reviewed  BASIC METABOLIC PANEL - Abnormal; Notable for the following components:      Result Value   Glucose, Bld 149 (*)    Calcium 8.8 (*)    All other components within normal limits  CBC - Abnormal; Notable for the following components:   RBC 3.86 (*)    Platelets 137 (*)    All other components within normal limits  TROPONIN I (HIGH SENSITIVITY)    EKG EKG Interpretation  Date/Time:  Thursday July 06 2022 17:40:32 EDT Ventricular Rate:  84 PR Interval:  152 QRS Duration: 80 QT Interval:  378 QTC Calculation: 446 R Axis:   72 Text Interpretation: Sinus rhythm with frequent Premature ventricular complexes Otherwise normal ECG When compared with ECG of 28-Jun-2022 10:50, PREVIOUS ECG IS PRESENT Confirmed by Lennice Sites ([id number]) on 07/06/2022 5:44:49 PM  Radiology No results found.  Procedures Procedures    Medications Ordered in ED Medications - No data to display  ED Course/ Medical Decision Making/ A&P                           Medical Decision Making Amount and/or Complexity of Data Reviewed Labs: ordered.   Ann Hobbs is here with concern for medicine side effect, palpitations, dizziness, nausea.  Normal vitals.  No fever.  EKG shows sinus rhythm.  No ischemic changes.  Some PVCs on the monitor while talking to her in the room.  She has normal neuro exam.  Shortly after taking her Roxicodone pain medicine this morning she got dizzy and lightheaded and felt nauseous and palpitations.  Symptoms have resolved.  She is  not having any chest pain.  Still with some palpitations.  She had elbow surgery a few days ago.She is only taken a few doses of the pain medicine.  Lab work was collected and per my review and interpretation is no significant anemia, electrolyte abnormality, kidney injury or leukocytosis.  Troponin is normal.  Have no concern for ACS or PE or other acute cardiac or pulmonary process.  Have no concern for stroke.  My suspicion is that she had an adverse reaction to her pain medicine.  Recommend that she  try to minimize this medicine is much as possible and to make sure that she eats and drinks plenty of fluids when she is taking this medicine.  Patient discharged in good condition.  Understands return precautions.  This chart was dictated using voice recognition software.  Despite best efforts to proofread,  errors can occur which can change the documentation meaning.         Final Clinical Impression(s) / ED Diagnoses Final diagnoses:  Heart palpitations  Lightheaded    Rx / DC Orders ED Discharge Orders     None         Lennice Sites, DO 07/06/22 1935

## 2022-07-06 NOTE — Discharge Instructions (Addendum)
Please be careful when taking narcotic pain medicine.  Try to take these as little as possible.

## 2022-07-06 NOTE — ED Triage Notes (Signed)
Pt to ED C/O racing heart rate since this morning. Ann KitchenHx AFIB. Recent surgery to right elbow- currently in sling. Pt also c/o nausea.

## 2022-07-31 ENCOUNTER — Ambulatory Visit: Payer: Medicare Other | Attending: Cardiology | Admitting: Cardiology

## 2022-07-31 ENCOUNTER — Encounter: Payer: Self-pay | Admitting: Cardiology

## 2022-07-31 VITALS — BP 146/72 | HR 85 | Ht 65.0 in | Wt 161.4 lb

## 2022-07-31 DIAGNOSIS — I10 Essential (primary) hypertension: Secondary | ICD-10-CM | POA: Diagnosis not present

## 2022-07-31 DIAGNOSIS — I480 Paroxysmal atrial fibrillation: Secondary | ICD-10-CM

## 2022-07-31 DIAGNOSIS — I493 Ventricular premature depolarization: Secondary | ICD-10-CM

## 2022-07-31 NOTE — Progress Notes (Signed)
Electrophysiology Office Note   Date:  07/31/2022   ID:  Ann Hobbs, DOB 1946-01-13, MRN [id number]  PCP:  Berkley Harvey, NP  Cardiologist:   Primary Electrophysiologist:  Ziggy Chanthavong Meredith Leeds, MD    Chief Complaint: AF, PVC   History of Present Illness: Ann Hobbs is a 76 y.o. female who is being seen today for the evaluation of AF, PVC at the request of Berkley Harvey, NP. Presenting today for electrophysiology evaluation.  She has a history significant for atrial fibrillation post ablation 05/30/2018, diabetes, hypertension, hyperlipidemia.  She presented to A-fib clinic and was found to have PVCs.  She wore a cardiac monitor with a 40% burden.  Echo showed a normal ejection fraction.  Today, she denies symptoms of chest pain, shortness of breath, orthopnea, PND, lower extremity edema, claudication, dizziness, presyncope, syncope, bleeding, or neurologic sequela. The patient is tolerating medications without difficulties.  She notes that she has been having palpitations.  Palpitations have been occurring for the last few months.  She at times she feels dizzy and fatigued.  There are also days that she feels normal.  This is different from her atrial fibrillation.   Past Medical History:  Diagnosis Date   A-fib [facility])    Brain lesion    Diabetes mellitus without complication ([place])    Dizziness    Elevated glucose    Essential hypertension 04/25/2016   Fall    Hematoma    Hyperlipidemia 04/25/2016   Hypertension    Paroxysmal atrial fibrillation ([place]) 04/25/2016   PONV (postoperative nausea and vomiting)    Typical atrial flutter ([place]) 04/25/2016   Weakness    Past Surgical History:  Procedure Laterality Date   APPENDECTOMY     ATRIAL FIBRILLATION ABLATION N/A 05/30/2018   Procedure: ATRIAL FIBRILLATION ABLATION;  Surgeon: Thompson Grayer, MD;  Location: [facility] CV LAB;  Service: Cardiovascular;  Laterality: N/A;   CARDIOVERSION N/A 02/05/2018   Procedure:  CARDIOVERSION;  Surgeon: Josue Hector, MD;  Location: [facility] ENDOSCOPY;  Service: Cardiovascular;  Laterality: N/A;   ELBOW FRACTURE SURGERY     KNEE ARTHROSCOPY     PARTIAL HYSTERECTOMY       Current Outpatient Medications  Medication Sig Dispense Refill   Acetaminophen (ACETAMIN PO) Take 650 mg by mouth as needed.     Ascorbic Acid (VITAMIN C) 1000 MG tablet Take 1,000 mg by mouth daily.     atorvastatin (LIPITOR) 20 MG tablet TAKE ONE (1) TABLET BY MOUTH EACH DAY AT6PM 90 tablet 3   b complex vitamins capsule Take 1 capsule by mouth daily.     Blood Glucose Monitoring Suppl (GHT BLOOD GLUCOSE MONITOR) w/Device KIT Apply 1 strip topically as directed.     Brimonidine Tartrate (MIRVASO) 0.33 % GEL Apply topically daily.     Cholecalciferol (VITAMIN D-3 PO) Take 1 tablet by mouth every morning.     clotrimazole-betamethasone (LOTRISONE) cream Apply topically daily as needed.     estradiol (ESTRACE) 0.1 MG/GM vaginal cream Place 1 Applicatorful vaginally as needed.     fluticasone (FLONASE) 50 MCG/ACT nasal spray SMARTSIG:1 Spray(s) Both Nares Daily PRN     metoprolol succinate (TOPROL-XL) 25 MG 24 hr tablet Take 1/2 tablet by mouth at bedtime 30 tablet 3   ReliOn Ultra Thin Lancets MISC Apply 1 strip topically as directed.     fluorouracil (EFUDEX) 5 % cream Apply topically 2 (two) times daily.     No current facility-administered medications for this  visit.    Allergies:   Codeine, Lisinopril, and Prednisone   Social History:  The patient  reports that she has never smoked. She has never used smokeless tobacco. She reports current alcohol use of about 1.0 standard drink of alcohol per week. She reports that she does not use drugs.   Family History:  The patient's family history includes Cancer in her father; Diabetes in her sister; Heart disease in her maternal grandmother and mother; Macular degeneration in her mother.    ROS:  Please see the history of present illness.   Otherwise,  review of systems is positive for none.   All other systems are reviewed and negative.    PHYSICAL EXAM: VS:  BP (!) 146/72   Pulse 85   Ht _0  (1.651 m)   Wt 161 lb 6.4 oz (73.2 kg)   SpO2 98%   BMI 26.86 kg/m  , BMI Body mass index is 26.86 kg/m. GEN: Well nourished, well developed, in no acute distress  HEENT: normal  Neck: no JVD, carotid bruits, or masses Cardiac: irregular; no murmurs, rubs, or gallops,no edema  Respiratory:  clear to auscultation bilaterally, normal work of breathing GI: soft, nontender, nondistended, + BS MS: no deformity or atrophy  Skin: warm and dry Neuro:  Strength and sensation are intact Psych: euthymic mood, full affect  EKG:  EKG is ordered today. Personal review of the ekg ordered shows this rhythm, ventricular bigeminy  Recent Labs: 07/06/2022: BUN 12; Creatinine, Ser 0.60; Hemoglobin 12.3; Platelets 137; Potassium 4.5; Sodium 136    Lipid Panel  No results found for: "CHOL", "TRIG", "HDL", "CHOLHDL", "VLDL", "LDLCALC", "LDLDIRECT"   Wt Readings from Last 3 Encounters:  07/31/22 161 lb 6.4 oz (73.2 kg)  07/06/22 156 lb (70.8 kg)  06/28/22 156 lb 12.8 oz (71.1 kg)      Other studies Reviewed: Additional studies/ records that were reviewed today include: TTE 02/16/22  Review of the above records today demonstrates:   1. Left ventricular ejection fraction, by estimation, is 55%. The left  ventricle has normal function. The left ventricle has no regional wall  motion abnormalities. Left ventricular diastolic parameters are  indeterminate.   2. Right ventricular systolic function is normal. The right ventricular  size is normal. There is moderately elevated pulmonary artery systolic  pressure.   3. Left atrial size was mildly dilated.   4. Right atrial size was mildly dilated.   5. The mitral valve is normal in structure. Mild mitral valve  regurgitation.   6. The aortic valve is normal in structure. Aortic valve regurgitation is   mild.   7. The inferior vena cava is dilated in size with <50% respiratory  variability, suggesting right atrial pressure of 15 mmHg.   Cardiac monitor 02/01/2022 personally reviewed Predominant rhythm was sinus rhythm 38-40% ventricular ectopy No atrial fibrillation No triggered episodes  ASSESSMENT AND PLAN:  1.  Atrial fibrillation: Status post ablation in 2019.  CHA2DS2-VASc of 3.  She has stopped her anticoagulation.  No changes.  2.  PVCs: Elevated burden at 38 to 40%.  Fortunately ejection fraction is remained normal.  She would prefer to avoid antiarrhythmic medications as she has had issues with medications in the past.  Due to that, we Jarred Purtee plan for ablation.  Risk and benefits were discussed.  Risk include bleeding, infection, tamponade, heart block, stroke, among others.  She understands these risks and is agreed to the procedure.  3.  Hypertension: Elevated today.  Usually  well controlled.  No changes.    Current medicines are reviewed at length with the patient today.   The patient does not have concerns regarding her medicines.  The following changes were made today:  none  Labs/ tests ordered today include:  No orders of the defined types were placed in this encounter.    Disposition:   FU with Zylee Marchiano 3 months  Signed, Delton Stelle Meredith Leeds, MD  07/31/2022 5:00 PM     [address] [place] [address] [phone] (office) [phone] (fax)

## 2022-07-31 NOTE — Patient Instructions (Addendum)
Medication Instructions:  Your physician recommends that you continue on your current medications as directed. Please refer to the Current Medication list given to you today.  *If you need a refill on your cardiac medications before your next appointment, please call your pharmacy*   Lab Work: None ordered If you have labs (blood work) drawn today and your tests are completely normal, you will receive your results only by: [place] (if you have MyChart) OR A paper copy in the mail If you have any lab test that is abnormal or we need to change your treatment, we will call you to review the results.   Testing/Procedures: Your physician has recommended that you have an ablation. Catheter ablation is a medical procedure used to treat some cardiac arrhythmias (irregular heartbeats). During catheter ablation, a long, thin, flexible tube is put into a blood vessel in your groin (upper thigh), or neck. This tube is called an ablation catheter. It is then guided to your heart through the blood vessel. Radio frequency waves destroy small areas of heart tissue where abnormal heartbeats may cause an arrhythmia to start.   You will be scheduled for 12/15/2022  We will contact you at a later time to go over instructions  Follow-Up: At [facility], you and your health needs are our priority.  As part of our continuing mission to provide you with exceptional heart care, we have created designated Provider Care Teams.  These Care Teams include your primary Cardiologist (physician) and Advanced Practice Providers (APPs -  Physician Assistants and Nurse Practitioners) who all work together to provide you with the care you need, when you need it.   Your next appointment:   4 week(s) after your ablation  The format for your next appointment:   In Person  Provider:   Allegra Lai, MD    Thank you for choosing [facility]!!   Trinidad Curet, RN [phone]  Other  Instructions    Important Information About Sugar

## 2022-08-01 NOTE — Addendum Note (Signed)
Addended by: Michelle Nasuti on: 08/01/2022 08:32 AM   Modules accepted: Orders

## 2022-08-23 ENCOUNTER — Other Ambulatory Visit: Payer: Self-pay | Admitting: Internal Medicine

## 2022-11-16 ENCOUNTER — Encounter (HOSPITAL_COMMUNITY): Payer: Self-pay | Admitting: *Deleted

## 2022-11-24 ENCOUNTER — Telehealth: Payer: Self-pay | Admitting: Cardiology

## 2022-11-24 ENCOUNTER — Encounter: Payer: Self-pay | Admitting: *Deleted

## 2022-11-24 DIAGNOSIS — Z79899 Other long term (current) drug therapy: Secondary | ICD-10-CM

## 2022-11-24 DIAGNOSIS — I493 Ventricular premature depolarization: Secondary | ICD-10-CM

## 2022-11-24 NOTE — Telephone Encounter (Signed)
Pt would like to speak to RN on how long it will take for her to recover from her upcoming ablation.

## 2022-11-24 NOTE — Telephone Encounter (Signed)
Returned pt call Discussed procedure instructions, aware letter will be sent via mychart. Pt will stop by [facility] in [facility] in the next several weeks for non-fasting pre procedure blood work. She appreciates my call and education.

## 2022-11-29 LAB — BASIC METABOLIC PANEL
BUN/Creatinine Ratio: 30 — ABNORMAL HIGH (ref 12–28)
BUN: 20 mg/dL (ref 8–27)
CO2: 24 mmol/L (ref 20–29)
Calcium: 9.2 mg/dL (ref 8.7–10.3)
Chloride: 102 mmol/L (ref 96–106)
Creatinine, Ser: 0.66 mg/dL (ref 0.57–1.00)
Glucose: 107 mg/dL — ABNORMAL HIGH (ref 70–99)
Potassium: 4.8 mmol/L (ref 3.5–5.2)
Sodium: 141 mmol/L (ref 134–144)
eGFR: 92 mL/min/{1.73_m2} (ref >59)

## 2022-11-29 LAB — CBC
Hematocrit: 39.4 % (ref 34.0–46.6)
Hemoglobin: 13 g/dL (ref 11.1–15.9)
MCH: 32.6 pg (ref 26.6–33.0)
MCHC: 33 g/dL (ref 31.5–35.7)
MCV: 99 fL — ABNORMAL HIGH (ref 79–97)
Platelets: 179 10*3/uL (ref 150–450)
RBC: 3.99 x10E6/uL (ref 3.77–5.28)
RDW: 14.3 % (ref 11.7–15.4)
WBC: 5.6 10*3/uL (ref 3.4–10.8)

## 2022-12-14 NOTE — Pre-Procedure Instructions (Signed)
Instructed patient on the following items: Arrival time 0515 Nothing to eat or drink after midnight No meds AM of procedure Responsible person to drive you home and stay with you for 24 hrs

## 2022-12-15 ENCOUNTER — Ambulatory Visit (HOSPITAL_BASED_OUTPATIENT_CLINIC_OR_DEPARTMENT_OTHER): Payer: Medicare Other | Admitting: Anesthesiology

## 2022-12-15 ENCOUNTER — Ambulatory Visit (HOSPITAL_COMMUNITY)
Admission: RE | Admit: 2022-12-15 | Discharge: 2022-12-15 | Disposition: A | Discharge status: Home / Self Care | Payer: Medicare Other | Attending: Cardiology | Admitting: Cardiology

## 2022-12-15 ENCOUNTER — Ambulatory Visit (HOSPITAL_COMMUNITY): Payer: Medicare Other | Admitting: Anesthesiology

## 2022-12-15 ENCOUNTER — Encounter (HOSPITAL_COMMUNITY): Payer: Self-pay | Admitting: Cardiology

## 2022-12-15 ENCOUNTER — Encounter (HOSPITAL_COMMUNITY)
Admission: RE | Disposition: A | Discharge status: Home / Self Care | Payer: Self-pay | Source: Home / Self Care | Attending: Cardiology

## 2022-12-15 ENCOUNTER — Other Ambulatory Visit: Payer: Self-pay

## 2022-12-15 DIAGNOSIS — E785 Hyperlipidemia, unspecified: Secondary | ICD-10-CM | POA: Diagnosis not present

## 2022-12-15 DIAGNOSIS — I10 Essential (primary) hypertension: Secondary | ICD-10-CM

## 2022-12-15 DIAGNOSIS — E119 Type 2 diabetes mellitus without complications: Secondary | ICD-10-CM | POA: Diagnosis not present

## 2022-12-15 DIAGNOSIS — I480 Paroxysmal atrial fibrillation: Secondary | ICD-10-CM | POA: Diagnosis not present

## 2022-12-15 DIAGNOSIS — I471 Supraventricular tachycardia, unspecified: Secondary | ICD-10-CM

## 2022-12-15 DIAGNOSIS — M1990 Unspecified osteoarthritis, unspecified site: Secondary | ICD-10-CM

## 2022-12-15 DIAGNOSIS — I493 Ventricular premature depolarization: Secondary | ICD-10-CM | POA: Insufficient documentation

## 2022-12-15 HISTORY — PX: PVC ABLATION: EP1236

## 2022-12-15 LAB — GLUCOSE, CAPILLARY
Glucose-Capillary: 98 mg/dL (ref 70–99)
Glucose-Capillary: 99 mg/dL (ref 70–99)
Glucose-Capillary: 98 mg/dL (ref 70–99)

## 2022-12-15 SURGERY — PVC ABLATION
Anesthesia: General

## 2022-12-15 MED ORDER — ONDANSETRON HCL 4 MG/2ML IJ SOLN
INTRAMUSCULAR | Status: DC | PRN
  Administered 2022-12-15: 4 mg via INTRAVENOUS

## 2022-12-15 MED ORDER — PROPOFOL 10 MG/ML IV BOLUS
INTRAVENOUS | Status: DC | PRN
  Administered 2022-12-15: 40 mg via INTRAVENOUS
  Administered 2022-12-15: 50 mg via INTRAVENOUS
  Administered 2022-12-15: 20 mg via INTRAVENOUS
  Administered 2022-12-15: 40 mg via INTRAVENOUS
  Administered 2022-12-15: 50 mg via INTRAVENOUS

## 2022-12-15 MED ORDER — FENTANYL CITRATE (PF) 100 MCG/2ML IJ SOLN
INTRAMUSCULAR | Status: DC | PRN
  Administered 2022-12-15 (×2): 50 ug via INTRAVENOUS

## 2022-12-15 MED ORDER — HEPARIN SODIUM (PORCINE) 1000 UNIT/ML IJ SOLN
INTRAMUSCULAR | Status: DC | PRN
  Administered 2022-12-15: 1000 [IU] via INTRAVENOUS

## 2022-12-15 MED ORDER — HEPARIN (PORCINE) IN NACL 1000-0.9 UT/500ML-% IV SOLN
INTRAVENOUS | Status: DC | PRN
  Administered 2022-12-15 (×2): 500 mL

## 2022-12-15 MED ORDER — HEPARIN SODIUM (PORCINE) 1000 UNIT/ML IJ SOLN
INTRAMUSCULAR | Status: AC
  Filled 2022-12-15: qty 10

## 2022-12-15 MED ORDER — HEPARIN SODIUM (PORCINE) 1000 UNIT/ML IJ SOLN
INTRAMUSCULAR | Status: DC | PRN
  Administered 2022-12-15: 5000 [IU] via INTRAVENOUS

## 2022-12-15 MED ORDER — ACETAMINOPHEN 325 MG PO TABS: 650.0000 mg | ORAL_TABLET | ORAL | Status: DC | PRN

## 2022-12-15 MED ORDER — ONDANSETRON HCL 4 MG/2ML IJ SOLN: 4.0000 mg | Freq: Four times a day (QID) | INTRAMUSCULAR | Status: DC | PRN

## 2022-12-15 MED ORDER — SODIUM CHLORIDE 0.9% FLUSH: 3.0000 mL | INTRAVENOUS | Status: DC | PRN

## 2022-12-15 MED ORDER — SODIUM CHLORIDE 0.9 % IV SOLN: INTRAVENOUS | Status: DC

## 2022-12-15 MED ORDER — LIDOCAINE HCL 1 % IJ SOLN
INTRAMUSCULAR | Status: AC
  Filled 2022-12-15: qty 20

## 2022-12-15 MED ORDER — SODIUM CHLORIDE 0.9% FLUSH: 3.0000 mL | Freq: Two times a day (BID) | INTRAVENOUS | Status: DC

## 2022-12-15 MED ORDER — SODIUM CHLORIDE 0.9 % IV SOLN: 250.0000 mL | INTRAVENOUS | Status: DC | PRN

## 2022-12-15 SURGICAL SUPPLY — 14 items
CATH ABLAT QDOT MICRO BI TC DF (CATHETERS) IMPLANT
CATH DECANAV F CURVE (CATHETERS) IMPLANT
CATH GE 8FR SOUNDSTAR (CATHETERS) IMPLANT
CATH JOSEPH QUAD ALLRED 6F REP (CATHETERS) IMPLANT
CLOSURE PERCLOSE PROSTYLE (VASCULAR PRODUCTS) IMPLANT
PACK EP LATEX FREE (CUSTOM PROCEDURE TRAY) ×1
PACK EP LF (CUSTOM PROCEDURE TRAY) ×2 IMPLANT
PAD DEFIB RADIO PHYSIO CONN (PAD) ×2 IMPLANT
PATCH CARTO3 (PAD) IMPLANT
SHEATH 9FR PRELUDE SNAP 13 (SHEATH) IMPLANT
SHEATH PINNACLE 7F 10CM (SHEATH) IMPLANT
SHEATH PINNACLE 8F 10CM (SHEATH) IMPLANT
SHEATH PROBE COVER 6X72 (BAG) IMPLANT
TUBING SMART ABLATE COOLFLOW (TUBING) IMPLANT

## 2022-12-15 NOTE — Progress Notes (Signed)
Pt ambulated to and from bathroom to void with no signs of oozing from groin site  

## 2022-12-15 NOTE — Discharge Instructions (Signed)

## 2022-12-15 NOTE — Progress Notes (Signed)
Went to pt's room with supplies to in and out cath for urinary retention, pt stated that she had been able to void. In and Out not needed at this time

## 2022-12-15 NOTE — Anesthesia Preprocedure Evaluation (Addendum)
Anesthesia Evaluation  Patient identified by MRN, date of birth, ID band Patient awake    Reviewed: Allergy & Precautions, H&P , NPO status , Patient's Chart, lab work & pertinent test results, reviewed documented beta blocker date and time   History of Anesthesia Complications (+) PONV and history of anesthetic complications (cant remember if she did well w/ TIVA or not)  Airway Mallampati: III  TM Distance: >3 FB Neck ROM: Full    Dental  (+) Teeth Intact, Dental Advisory Given   Pulmonary neg pulmonary ROS   Pulmonary exam normal breath sounds clear to auscultation       Cardiovascular hypertension (146/69 preop, normally slightly lower), Pt. on medications and Pt. on home beta blockers pulmonary hypertension (mod pHTN on echo)Normal cardiovascular exam+ dysrhythmias (no anticoagulation) Atrial Fibrillation + Valvular Problems/Murmurs (mild MR) MR  Rhythm:Regular Rate:Normal  Echo 02/2022  1. Left ventricular ejection fraction, by estimation, is 55%. The left  ventricle has normal function. The left ventricle has no regional wall  motion abnormalities. Left ventricular diastolic parameters are  indeterminate.   2. Right ventricular systolic function is normal. The right ventricular  size is normal. There is moderately elevated pulmonary artery systolic  pressure.   3. Left atrial size was mildly dilated.   4. Right atrial size was mildly dilated.   5. The mitral valve is normal in structure. Mild mitral valve  regurgitation.   6. The aortic valve is normal in structure. Aortic valve regurgitation is  mild.   7. The inferior vena cava is dilated in size with <50% respiratory  variability, suggesting right atrial pressure of 15 mmHg.   Comparison(s): The left ventricular function is unchanged.     Neuro/Psych negative neurological ROS  negative psych ROS   GI/Hepatic negative GI ROS, Neg liver ROS,,,  Endo/Other   diabetes, Well Controlled, Type 2    Renal/GU negative Renal ROS  negative genitourinary   Musculoskeletal  (+) Arthritis , Osteoarthritis,    Abdominal   Peds negative pediatric ROS (+)  Hematology negative hematology ROS (+)   Anesthesia Other Findings   Reproductive/Obstetrics negative OB ROS                             Anesthesia Physical Anesthesia Plan  ASA: 3  Anesthesia Plan: MAC   Post-op Pain Management: Tylenol PO (pre-op)*   Induction:   PONV Risk Score and Plan: 4 or greater and Ondansetron, Midazolam, Treatment may vary due to age or medical condition, Propofol infusion and TIVA  Airway Management Planned: Natural Airway and Simple Face Mask  Additional Equipment: None  Intra-op Plan:   Post-operative Plan:   Informed Consent: I have reviewed the patients History and Physical, chart, labs and discussed the procedure including the risks, benefits and alternatives for the proposed anesthesia with the patient or authorized representative who has indicated his/her understanding and acceptance.     Dental advisory given  Plan Discussed with: CRNA  Anesthesia Plan Comments: (Has required TIVA for PONV in the past)       Anesthesia Quick Evaluation

## 2022-12-15 NOTE — Progress Notes (Signed)
Pt c/o bladder fullness, purewick placed, pt unable to urinate, bladder scan revealed >715 in bladder. Paged Renee U for orders, given verbal order for In and out cath

## 2022-12-15 NOTE — H&P (Signed)
Electrophysiology Office Note   Date:  12/15/2022   ID:  Tymia Buche Trentham, DOB 1946/06/18, MRN [id number]  PCP:  Berkley Harvey, NP  Cardiologist:   Primary Electrophysiologist:  Momina Hunton Meredith Leeds, MD    Chief Complaint: AF, PVC   History of Present Illness: Aleasa Draughn Dargis is a 77 y.o. female who is being seen today for the evaluation of AF, PVC at the request of No ref. provider found. Presenting today for electrophysiology evaluation.  She has a history significant for atrial fibrillation post ablation 05/30/2018, diabetes, hypertension, hyperlipidemia.  She presented to A-fib clinic and was found to have PVCs.  She wore a cardiac monitor with a 40% burden.  Echo showed a normal ejection fraction.  Today, denies symptoms of palpitations, chest pain, shortness of breath, orthopnea, PND, lower extremity edema, claudication, dizziness, presyncope, syncope, bleeding, or neurologic sequela. The patient is tolerating medications without difficulties. Plan ablation today.    Past Medical History:  Diagnosis Date   A-fib [facility])    Brain lesion    Diabetes mellitus without complication ([place])    Dizziness    Elevated glucose    Essential hypertension 04/25/2016   Fall    Hematoma    Hyperlipidemia 04/25/2016   Hypertension    Paroxysmal atrial fibrillation ([place]) 04/25/2016   PONV (postoperative nausea and vomiting)    Typical atrial flutter ([place]) 04/25/2016   Weakness    Past Surgical History:  Procedure Laterality Date   APPENDECTOMY     ATRIAL FIBRILLATION ABLATION N/A 05/30/2018   Procedure: ATRIAL FIBRILLATION ABLATION;  Surgeon: Thompson Grayer, MD;  Location: [place] [facility];  Service: Cardiovascular;  Laterality: N/A;   CARDIOVERSION N/A 02/05/2018   Procedure: CARDIOVERSION;  Surgeon: Josue Hector, MD;  Location: [facility] ENDOSCOPY;  Service: Cardiovascular;  Laterality: N/A;   ELBOW FRACTURE SURGERY     KNEE ARTHROSCOPY     PARTIAL HYSTERECTOMY       Current  Facility-Administered Medications  Medication Dose Route Frequency Provider Last Rate Last Admin   0.9 %  sodium chloride infusion   Intravenous Continuous Constance Haw, MD 50 mL/hr at 12/15/22 0617 New Bag at 12/15/22 0617    Allergies:   Codeine, Lisinopril, Oxycodone, and Prednisone   Social History:  The patient  reports that she has never smoked. She has never used smokeless tobacco. She reports current alcohol use of about 1.0 standard drink of alcohol per week. She reports that she does not use drugs.   Family History:  The patient's family history includes Cancer in her father; Diabetes in her sister; Heart disease in her maternal grandmother and mother; Macular degeneration in her mother.   ROS:  Please see the history of present illness.   Otherwise, review of systems is positive for none.   All other systems are reviewed and negative.   PHYSICAL EXAM: VS:  BP (!) 146/69   Pulse 70   Temp (!) 97 F (36.1 C) (Temporal)   Resp 16   Ht '5\' 4"'$  (1.626 m)   Wt 72.6 kg   SpO2 99%   BMI 27.46 kg/m  , BMI Body mass index is 27.46 kg/m. GEN: Well nourished, well developed, in no acute distress  HEENT: normal  Neck: no JVD, carotid bruits, or masses Cardiac: iRRR; no murmurs, rubs, or gallops,no edema  Respiratory:  clear to auscultation bilaterally, normal work of breathing GI: soft, nontender, nondistended, + BS MS: no deformity or atrophy  Skin: warm and  dry Neuro:  Strength and sensation are intact Psych: euthymic mood, full aff  Recent Labs: 11/28/2022: BUN 20; Creatinine, Ser 0.66; Hemoglobin 13.0; Platelets 179; Potassium 4.8; Sodium 141    Lipid Panel  No results found for: "CHOL", "TRIG", "HDL", "CHOLHDL", "VLDL", "LDLCALC", "LDLDIRECT"   Wt Readings from Last 3 Encounters:  12/15/22 72.6 kg  07/31/22 73.2 kg  07/06/22 70.8 kg      Other studies Reviewed: Additional studies/ records that were reviewed today include: TTE 02/16/22  Review of the above  records today demonstrates:   1. Left ventricular ejection fraction, by estimation, is 55%. The left  ventricle has normal function. The left ventricle has no regional wall  motion abnormalities. Left ventricular diastolic parameters are  indeterminate.   2. Right ventricular systolic function is normal. The right ventricular  size is normal. There is moderately elevated pulmonary artery systolic  pressure.   3. Left atrial size was mildly dilated.   4. Right atrial size was mildly dilated.   5. The mitral valve is normal in structure. Mild mitral valve  regurgitation.   6. The aortic valve is normal in structure. Aortic valve regurgitation is  mild.   7. The inferior vena cava is dilated in size with <50% respiratory  variability, suggesting right atrial pressure of 15 mmHg.   Cardiac monitor 02/01/2022 personally reviewed Predominant rhythm was sinus rhythm 38-40% ventricular ectopy No atrial fibrillation No triggered episodes  ASSESSMENT AND PLAN:  1.  Atrial fibrillation: Status post ablation in 2019.  CHA2DS2-VASc of 3.  She has stopped her anticoagulation.  No changes.  2.  PVCs: Bruna Bush Geffre has presented today for surgery, with the diagnosis of PVC.  The various methods of treatment have been discussed with the patient and family. After consideration of risks, benefits and other options for treatment, the patient has consented to  Procedure(s): Catheter ablation as a surgical intervention .  Risks include but not limited to complete heart block, stroke, esophageal damage, nerve damage, bleeding, vascular damage, tamponade, perforation, MI, and death. The patient's history has been reviewed, patient examined, no change in status, stable for surgery.  I have reviewed the patient's chart and labs.  Questions were answered to the patient's satisfaction.    Theodosia Bahena Curt Bears, MD 12/15/2022 7:03 AM

## 2022-12-15 NOTE — Transfer of Care (Signed)
Immediate Anesthesia Transfer of Care Note  Patient: Ann Hobbs  Procedure(s) Performed: PVC ABLATION  Patient Location: Cath Lab  Anesthesia Type:MAC  Level of Consciousness: awake, alert , and oriented  Airway & Oxygen Therapy: Patient Spontanous Breathing and Patient connected to nasal cannula oxygen  Post-op Assessment: Report given to RN and Post -op Vital signs reviewed and stable  Post vital signs: Reviewed and stable  Last Vitals:  Vitals Value Taken Time  BP    Temp    Pulse    Resp    SpO2      Last Pain:  Vitals:   12/15/22 0612  TempSrc:   PainSc: 0-No pain         Complications: There were no known notable events for this encounter.

## 2022-12-15 NOTE — Anesthesia Postprocedure Evaluation (Signed)
Anesthesia Post Note  Patient: Ann Hobbs  Procedure(s) Performed: PVC ABLATION     Patient location during evaluation: PACU Anesthesia Type: MAC Level of consciousness: awake and alert, oriented and patient cooperative Pain management: pain level controlled Vital Signs Assessment: post-procedure vital signs reviewed and stable Respiratory status: spontaneous breathing, nonlabored ventilation and respiratory function stable Cardiovascular status: blood pressure returned to baseline and stable Postop Assessment: no apparent nausea or vomiting Anesthetic complications: no   There were no known notable events for this encounter.  Last Vitals:  Vitals:   12/15/22 0945 12/15/22 0950  BP: (!) 177/73   Pulse: 62   Resp: 16   Temp:  36.6 C  SpO2: 100%     Last Pain:  Vitals:   12/15/22 0950  TempSrc: Temporal  PainSc:                  Pervis Hocking

## 2022-12-18 ENCOUNTER — Encounter (HOSPITAL_COMMUNITY): Payer: Self-pay | Admitting: Cardiology

## 2022-12-18 ENCOUNTER — Telehealth: Payer: Self-pay | Admitting: Cardiology

## 2022-12-18 NOTE — Telephone Encounter (Signed)
Patient states before her first ablation she was given metoprolol '25mg'$  and after the surgery she was taken off the medication. She states she was prescribed metoprolol by Butch Penny for PVC's she was having. She feels now that she has had ablation and was told it was successful can she stop taking the metoprolol because she feels its causing her to have dizziness in the mornings.

## 2022-12-18 NOTE — Telephone Encounter (Signed)
Pt c/o medication issue:  1. Name of Medication:   metoprolol succinate (TOPROL-XL) 25 MG 24 hr tablet    2. How are you currently taking this medication (dosage and times per day)?   3. Are you having a reaction (difficulty breathing--STAT)?   4. What is your medication issue? Patient is requesting call back to discuss if she is to continue this medication. Please advise.

## 2022-12-20 NOTE — Telephone Encounter (Signed)
Pt advised ok to hold/stop Metoprolol per MD. [name] to call office if she experiences elevated HRs/BPs after stopping the medication. Patient verbalized understanding and agreeable to plan.

## 2023-01-15 ENCOUNTER — Encounter: Payer: Self-pay | Admitting: Cardiology

## 2023-01-15 ENCOUNTER — Ambulatory Visit: Payer: Medicare Other | Attending: Cardiology | Admitting: Cardiology

## 2023-01-15 VITALS — BP 130/78 | HR 78 | Ht 64.0 in | Wt 165.0 lb

## 2023-01-15 DIAGNOSIS — I480 Paroxysmal atrial fibrillation: Secondary | ICD-10-CM | POA: Diagnosis not present

## 2023-01-15 DIAGNOSIS — I493 Ventricular premature depolarization: Secondary | ICD-10-CM | POA: Diagnosis not present

## 2023-01-15 NOTE — Progress Notes (Signed)
Electrophysiology Office Note   Date:  01/15/2023   ID:  Ann Hobbs, DOB 06-19-1946, MRN [id number]  PCP:  Iona Hansen, NP  Cardiologist:   Primary Electrophysiologist:  Izaiha Lo Jorja Loa, MD    Chief Complaint: AF, PVC   History of Present Illness: Ann Hobbs is a 77 y.o. female who is being seen today for the evaluation of AF, PVC at the request of Iona Hansen, NP. Presenting today for electrophysiology evaluation.  History significant for atrial fibrillation post ablation in 2019, diabetes, hypertension, hyperlipidemia.  She was in atrial fibrillation clinic and was found to have PVCs.  Cardiac monitor showed a 40% burden.  She is now status post ablation 12/15/2022.  Ablation was performed inferior to the left coronary cusp.  Since ablation she has done well.  She notes no further palpitations.  She has an increase energy level.  Today, denies symptoms of palpitations, chest pain, shortness of breath, orthopnea, PND, lower extremity edema, claudication, dizziness, presyncope, syncope, bleeding, or neurologic sequela. The patient is tolerating medications without difficulties.      Past Medical History:  Diagnosis Date   A-fib    Brain lesion    Diabetes mellitus without complication    Dizziness    Elevated glucose    Essential hypertension 04/25/2016   Fall    Hematoma    Hyperlipidemia 04/25/2016   Hypertension    Paroxysmal atrial fibrillation 04/25/2016   PONV (postoperative nausea and vomiting)    Typical atrial flutter 04/25/2016   Weakness    Past Surgical History:  Procedure Laterality Date   APPENDECTOMY     ATRIAL FIBRILLATION ABLATION N/A 05/30/2018   Procedure: ATRIAL FIBRILLATION ABLATION;  Surgeon: Hillis Range, MD;  Location: [facility] LAB;  Service: Cardiovascular;  Laterality: N/A;   CARDIOVERSION N/A 02/05/2018   Procedure: CARDIOVERSION;  Surgeon: Wendall Stade, MD;  Location: [facility] ENDOSCOPY;  Service: Cardiovascular;   Laterality: N/A;   ELBOW FRACTURE SURGERY     KNEE ARTHROSCOPY     PARTIAL HYSTERECTOMY     PVC ABLATION N/A 12/15/2022   Procedure: PVC ABLATION;  Surgeon: Regan Lemming, MD;  Location: [facility] IN[facility]AS[facility] LAB;  Service: Cardiovascular;  Laterality: N/A;     Current Outpatient Medications  Medication Sig Dispense Refill   Ascorbic Acid (SUPER C COMPLEX PO) Take 1 tablet by mouth daily.     atorvastatin (LIPITOR) 20 MG tablet TAKE ONE (1) TABLET BY MOUTH EACH DAY AT6PM 90 tablet 3   b complex vitamins capsule Take 1 capsule by mouth daily.     Blood Glucose Monitoring Suppl (GHT BLOOD GLUCOSE MONITOR) w/Device KIT Apply 1 strip topically as directed.     CALCIUM PO Take 2 tablets by mouth 2 (two) times daily. bone strength supplement     clotrimazole-betamethasone (LOTRISONE) cream Apply 1 Application topically daily as needed (rash).     estradiol (ESTRACE) 0.1 MG/GM vaginal cream Place 1 Applicatorful vaginally daily as needed (Dryness).     fluorouracil (EFUDEX) 5 % cream Apply 1 application  topically daily as needed (rosacea).     fluticasone (FLONASE) 50 MCG/ACT nasal spray Place 1 spray into both nostrils daily as needed for rhinitis.     furosemide (LASIX) 20 MG tablet Take 20 mg by mouth daily as needed for fluid or edema.     Magnesium 250 MG TABS Take 500 mg by mouth daily. Gummy     Misc Natural Products (TURMERIC CURCUMIN) CAPS Take  1 each by mouth 2 (two) times daily.     ReliOn Ultra Thin Lancets MISC Apply 1 strip topically as directed.     vitamin E 180 MG (400 UNITS) capsule Take 400 Units by mouth daily.     No current facility-administered medications for this visit.    Allergies:   Codeine, Lisinopril, Oxycodone, and Prednisone   Social History:  The patient  reports that she has never smoked. She has never used smokeless tobacco. She reports current alcohol use of about 1.0 standard drink of alcohol per week. She reports that she does not use drugs.   Family  History:  The patient's family history includes Cancer in her father; Diabetes in her sister; Heart disease in her maternal grandmother and mother; Macular degeneration in her mother.   ROS:  Please see the history of present illness.   Otherwise, review of systems is positive for none.   All other systems are reviewed and negative.   PHYSICAL EXAM: VS:  BP 130/78   Pulse 78   Ht 5\' 4"  (1.626 m)   Wt 165 lb (74.8 kg)   SpO2 99%   BMI 28.32 kg/m  , BMI Body mass index is 28.32 kg/m. GEN: Well nourished, well developed, in no acute distress  HEENT: normal  Neck: no JVD, carotid bruits, or masses Cardiac: RRR; no murmurs, rubs, or gallops,no edema  Respiratory:  clear to auscultation bilaterally, normal work of breathing GI: soft, nontender, nondistended, + BS MS: no deformity or atrophy  Skin: warm and dry Neuro:  Strength and sensation are intact Psych: euthymic mood, full affect  EKG:  EKG is ordered today. Personal review of the ekg ordered shows sinus rhythm   Recent Labs: 11/28/2022: BUN 20; Creatinine, Ser 0.66; Hemoglobin 13.0; Platelets 179; Potassium 4.8; Sodium 141    Lipid Panel  No results found for: "CHOL", "TRIG", "HDL", "CHOLHDL", "VLDL", "LDLCALC", "LDLDIRECT"   Wt Readings from Last 3 Encounters:  01/15/23 165 lb (74.8 kg)  12/15/22 160 lb (72.6 kg)  07/31/22 161 lb 6.4 oz (73.2 kg)      Other studies Reviewed: Additional studies/ records that were reviewed today include: TTE 02/16/22  Review of the above records today demonstrates:   1. Left ventricular ejection fraction, by estimation, is 55%. The left  ventricle has normal function. The left ventricle has no regional wall  motion abnormalities. Left ventricular diastolic parameters are  indeterminate.   2. Right ventricular systolic function is normal. The right ventricular  size is normal. There is moderately elevated pulmonary artery systolic  pressure.   3. Left atrial size was mildly dilated.    4. Right atrial size was mildly dilated.   5. The mitral valve is normal in structure. Mild mitral valve  regurgitation.   6. The aortic valve is normal in structure. Aortic valve regurgitation is  mild.   7. The inferior vena cava is dilated in size with <50% respiratory  variability, suggesting right atrial pressure of 15 mmHg.   Cardiac monitor 02/01/2022 personally reviewed Predominant rhythm was sinus rhythm 38-40% ventricular ectopy No atrial fibrillation No triggered episodes  ASSESSMENT AND PLAN:  1.  Atrial fibrillation: Status post ablation in 2019.  CHA2DS2-VASc of 3.  Has stopped anticoagulation.  No changes.  2.  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  Is status post ablation.  Ablation was performed inferior to the left coronary cusp.  No further PVCs noted.  Brodi Nery continue with current management.  3.  Hypertension:  well controlled   Current medicines are reviewed at length with the patient today.   The patient does not have concerns regarding her medicines.  The following changes were made today:  none  Labs/ tests ordered today include:  Orders Placed This Encounter  Procedures   EKG 12-Lead     Disposition:   FU 12 months  Signed, Cherre Kothari Jorja LoaMartin Mahki Spikes, MD  01/15/2023 3:10 PM     [facility] [address] [place] [address] [phone] (office) [phone] (fax)

## 2023-01-22 ENCOUNTER — Encounter: Payer: Self-pay | Admitting: *Deleted

## 2023-08-29 ENCOUNTER — Other Ambulatory Visit: Payer: Self-pay | Admitting: Cardiology

## 2023-12-26 ENCOUNTER — Other Ambulatory Visit: Payer: Self-pay | Admitting: Cardiology

## 2024-02-03 NOTE — Progress Notes (Unsigned)
  Electrophysiology Office Note:   Date:  02/04/2024  ID:  Calista Armwood Dewoody, DOB 1946/02/28, MRN [id number]  Primary Cardiologist: Maudine Sos, MD Primary Heart Failure: None Electrophysiologist: Tung Pustejovsky Cortland Ding, MD      History of Present Illness:   Ann Hobbs is a 78 y.o. female with h/o atrial fibrillation, diabetes, hypertension, hyperlipidemia, PVCs seen today for routine electrophysiology followup.   Since last being seen in our clinic the patient reports doing she is doing well.  She has noted only 1 episode of palpitations that happened over the weekend.  It occurred overnight.  She did not go to sleep until after midnight and it occurred when she was laying down to go to bed.  She is undergoing quite a bit of emotional stress as her husband was recently diagnosed with prostate cancer.  She has done overall well since her most recent ablation..  she denies chest pain, palpitations, dyspnea, PND, orthopnea, nausea, vomiting, dizziness, syncope, edema, weight gain, or early satiety.   Review of systems complete and found to be negative unless listed in HPI.   EP Information / Studies Reviewed:    EKG is ordered today. Personal review as below.  EKG Interpretation Date/Time:  Monday February 04 2024 11:09:40 EDT Ventricular Rate:  70 PR Interval:  160 QRS Duration:  80 QT Interval:  422 QTC Calculation: 455 R Axis:   83  Text Interpretation: Sinus rhythm with Premature atrial complexes When compared with ECG of 15-Dec-2022 09:32, No significant change since last tracing Confirmed by Rourke Mcquitty ([id number]) on 02/04/2024 11:14:14 AM     Risk Assessment/Calculations:    CHA2DS2-VASc Score = 4   This indicates a 4.8% annual risk of stroke. The patient's score is based upon: CHF History: 0 HTN History: 1 Diabetes History: 0 Stroke History: 0 Vascular Disease History: 0 Age Score: 2 Gender Score: 1          Physical Exam:   VS:  BP (!) 120/90 (BP Location:  Left Arm, Patient Position: Sitting, Cuff Size: Normal)   Pulse 70   Ht 5\' 4"  (1.626 m)   Wt 150 lb 12.8 oz (68.4 kg)   SpO2 98%   BMI 25.88 kg/m    Wt Readings from Last 3 Encounters:  02/04/24 150 lb 12.8 oz (68.4 kg)  01/15/23 165 lb (74.8 kg)  12/15/22 160 lb (72.6 kg)     GEN: Well nourished, well developed in no acute distress NECK: No JVD; No carotid bruits CARDIAC: Regular rate and rhythm, no murmurs, rubs, gallops RESPIRATORY:  Clear to auscultation without rales, wheezing or rhonchi  ABDOMEN: Soft, non-tender, non-distended EXTREMITIES:  No edema; No deformity   ASSESSMENT AND PLAN:    1.  Atrial fibrillation: Post ablation in 2019.  Not anticoagulated at this time due to no recurrence.  She has had no further episodes.  Happy with her care.  2.  PVCs: Elevated burden of 38 to 40%.  Post ablation inferior to the left coronary cusp.  No further PVCs noted.  Continue current management.  3.  Hypertension: Currently well-controlled  See her back as needed.  She has a Kardia mobile to continue to monitor her arrhythmia burden.  If she has further arrhythmias, I Mac Dowdell be happy to see her back.  Follow up with Dr. Lawana Pray  as needed   Signed, Pacer Dorn Cortland Ding, MD

## 2024-02-04 ENCOUNTER — Encounter: Payer: Self-pay | Admitting: Cardiology

## 2024-02-04 ENCOUNTER — Ambulatory Visit: Payer: Medicare Other | Attending: Cardiology | Admitting: Cardiology

## 2024-02-04 VITALS — BP 120/90 | HR 70 | Ht 64.0 in | Wt 150.8 lb

## 2024-02-04 DIAGNOSIS — I10 Essential (primary) hypertension: Secondary | ICD-10-CM | POA: Diagnosis not present

## 2024-02-04 DIAGNOSIS — I493 Ventricular premature depolarization: Secondary | ICD-10-CM

## 2024-02-04 DIAGNOSIS — I480 Paroxysmal atrial fibrillation: Secondary | ICD-10-CM | POA: Diagnosis not present

## 2024-03-31 ENCOUNTER — Other Ambulatory Visit: Payer: Self-pay | Admitting: Cardiology
